# Patient Record
Sex: Female | Born: 1945
Health system: Southern US, Community
[De-identification: ages and names within clinical notes are randomized; demographics above are authoritative.]

## PROBLEM LIST (undated history)

## (undated) DIAGNOSIS — E785 Hyperlipidemia, unspecified: Secondary | ICD-10-CM

## (undated) DIAGNOSIS — E119 Type 2 diabetes mellitus without complications: Secondary | ICD-10-CM

## (undated) DIAGNOSIS — M1990 Unspecified osteoarthritis, unspecified site: Secondary | ICD-10-CM

## (undated) DIAGNOSIS — I10 Essential (primary) hypertension: Secondary | ICD-10-CM

## (undated) DIAGNOSIS — M109 Gout, unspecified: Secondary | ICD-10-CM

## (undated) HISTORY — PX: JOINT REPLACEMENT: SHX530

## (undated) HISTORY — DX: Essential (primary) hypertension: I10

## (undated) HISTORY — DX: Hyperlipidemia, unspecified: E78.5

## (undated) HISTORY — DX: Type 2 diabetes mellitus without complications: E11.9

## (undated) HISTORY — DX: Gout, unspecified: M10.9

## (undated) HISTORY — PX: ABDOMINAL HYSTERECTOMY: SHX81

---

## 1997-07-08 ENCOUNTER — Ambulatory Visit (HOSPITAL_COMMUNITY): Admission: RE | Admit: 1997-07-08 | Discharge: 1997-07-08 | Payer: Self-pay | Admitting: Sports Medicine

## 1997-11-04 ENCOUNTER — Ambulatory Visit (HOSPITAL_COMMUNITY): Admission: RE | Admit: 1997-11-04 | Discharge: 1997-11-04 | Payer: Self-pay | Admitting: Orthopedic Surgery

## 1998-02-28 ENCOUNTER — Other Ambulatory Visit: Admission: RE | Admit: 1998-02-28 | Discharge: 1998-02-28 | Payer: Self-pay | Admitting: Obstetrics and Gynecology

## 1998-06-16 ENCOUNTER — Ambulatory Visit (HOSPITAL_COMMUNITY): Admission: RE | Admit: 1998-06-16 | Discharge: 1998-06-16 | Payer: Self-pay

## 1998-06-16 ENCOUNTER — Encounter: Payer: Self-pay | Admitting: Obstetrics and Gynecology

## 1999-06-22 ENCOUNTER — Ambulatory Visit (HOSPITAL_COMMUNITY): Admission: RE | Admit: 1999-06-22 | Discharge: 1999-06-22 | Payer: Self-pay | Admitting: Family Medicine

## 1999-06-22 ENCOUNTER — Encounter: Payer: Self-pay | Admitting: Obstetrics and Gynecology

## 2000-07-04 ENCOUNTER — Ambulatory Visit (HOSPITAL_COMMUNITY): Admission: RE | Admit: 2000-07-04 | Discharge: 2000-07-04 | Payer: Self-pay | Admitting: Obstetrics and Gynecology

## 2000-07-04 ENCOUNTER — Encounter: Payer: Self-pay | Admitting: Obstetrics and Gynecology

## 2000-09-02 ENCOUNTER — Other Ambulatory Visit: Admission: RE | Admit: 2000-09-02 | Discharge: 2000-09-02 | Payer: Self-pay | Admitting: Obstetrics and Gynecology

## 2000-12-05 ENCOUNTER — Ambulatory Visit (HOSPITAL_BASED_OUTPATIENT_CLINIC_OR_DEPARTMENT_OTHER): Admission: RE | Admit: 2000-12-05 | Discharge: 2000-12-05 | Payer: Self-pay | Admitting: Ophthalmology

## 2001-05-26 ENCOUNTER — Encounter: Payer: Self-pay | Admitting: Orthopedic Surgery

## 2001-05-28 ENCOUNTER — Inpatient Hospital Stay (HOSPITAL_COMMUNITY): Admission: RE | Admit: 2001-05-28 | Discharge: 2001-06-04 | Payer: Self-pay | Admitting: Orthopedic Surgery

## 2001-05-28 ENCOUNTER — Encounter: Payer: Self-pay | Admitting: Orthopedic Surgery

## 2001-07-27 ENCOUNTER — Encounter: Payer: Self-pay | Admitting: Obstetrics and Gynecology

## 2001-07-27 ENCOUNTER — Ambulatory Visit (HOSPITAL_COMMUNITY): Admission: RE | Admit: 2001-07-27 | Discharge: 2001-07-27 | Payer: Self-pay | Admitting: Obstetrics and Gynecology

## 2002-03-10 ENCOUNTER — Encounter: Payer: Self-pay | Admitting: Orthopedic Surgery

## 2002-03-10 ENCOUNTER — Inpatient Hospital Stay (HOSPITAL_COMMUNITY): Admission: RE | Admit: 2002-03-10 | Discharge: 2002-03-14 | Payer: Self-pay | Admitting: Orthopedic Surgery

## 2002-07-29 ENCOUNTER — Encounter: Payer: Self-pay | Admitting: Obstetrics and Gynecology

## 2002-07-29 ENCOUNTER — Ambulatory Visit (HOSPITAL_COMMUNITY): Admission: RE | Admit: 2002-07-29 | Discharge: 2002-07-29 | Payer: Self-pay | Admitting: Obstetrics and Gynecology

## 2003-08-11 ENCOUNTER — Ambulatory Visit (HOSPITAL_COMMUNITY): Admission: RE | Admit: 2003-08-11 | Discharge: 2003-08-11 | Payer: Self-pay | Admitting: Obstetrics and Gynecology

## 2004-08-13 ENCOUNTER — Ambulatory Visit (HOSPITAL_COMMUNITY): Admission: RE | Admit: 2004-08-13 | Discharge: 2004-08-13 | Payer: Self-pay | Admitting: Obstetrics and Gynecology

## 2004-12-04 ENCOUNTER — Ambulatory Visit (HOSPITAL_COMMUNITY): Admission: RE | Admit: 2004-12-04 | Discharge: 2004-12-04 | Payer: Self-pay | Admitting: Gastroenterology

## 2005-09-02 ENCOUNTER — Ambulatory Visit (HOSPITAL_COMMUNITY): Admission: RE | Admit: 2005-09-02 | Discharge: 2005-09-02 | Payer: Self-pay | Admitting: Obstetrics and Gynecology

## 2005-09-19 ENCOUNTER — Encounter: Payer: Self-pay | Admitting: Vascular Surgery

## 2005-09-19 ENCOUNTER — Ambulatory Visit (HOSPITAL_COMMUNITY): Admission: RE | Admit: 2005-09-19 | Discharge: 2005-09-19 | Payer: Self-pay | Admitting: Internal Medicine

## 2006-09-04 ENCOUNTER — Ambulatory Visit (HOSPITAL_COMMUNITY): Admission: RE | Admit: 2006-09-04 | Discharge: 2006-09-04 | Payer: Self-pay | Admitting: Obstetrics and Gynecology

## 2007-09-07 ENCOUNTER — Ambulatory Visit (HOSPITAL_COMMUNITY): Admission: RE | Admit: 2007-09-07 | Discharge: 2007-09-07 | Payer: Self-pay | Admitting: Obstetrics and Gynecology

## 2008-09-13 ENCOUNTER — Ambulatory Visit (HOSPITAL_COMMUNITY): Admission: RE | Admit: 2008-09-13 | Discharge: 2008-09-13 | Payer: Self-pay | Admitting: Obstetrics and Gynecology

## 2009-05-19 ENCOUNTER — Encounter: Admission: RE | Admit: 2009-05-19 | Discharge: 2009-05-19 | Payer: Self-pay | Admitting: Neurosurgery

## 2009-09-19 ENCOUNTER — Ambulatory Visit (HOSPITAL_COMMUNITY): Admission: RE | Admit: 2009-09-19 | Discharge: 2009-09-19 | Payer: Self-pay | Admitting: Obstetrics and Gynecology

## 2010-03-25 ENCOUNTER — Encounter: Payer: Self-pay | Admitting: Obstetrics and Gynecology

## 2010-07-20 NOTE — Discharge Summary (Signed)
[place]. [employer]  Patient:    Cynthia Torres, Cynthia Torres Visit Number: [id number] MRN: [id number]          Service Type: SUR Location: 5000 5009 01 Attending Physician:  Colbert Ewing Dictated by:   Silver Huguenin, P.A.C. Admit Date:  05/28/2001 Discharge Date: 06/04/2001                             Discharge Summary  ADMISSION DIAGNOSIS: Advanced degenerative joint disease of the right hip.  DISCHARGE DIAGNOSES: 1. Advanced degenerative joint disease of the right hip. 2. History of hypertension. 3. Exogenous obesity. 4. Hypokalemia.  PROCEDURE: Left total hip replacement.  HISTORY OF PRESENT ILLNESS: A 65 year old female with long standing bilateral hip degenerative joint disease. Her right has now become very symptomatic with pain as well as with activities of daily living. Also noted night time pain. She has failed conservative treatment. She is indicated for a right total hip replacement.  HOSPITAL COURSE: A 65 year old female admitted on May 28, 2001 after appropriate laboratory studies were obtained. She was taken to the operating room where she underwent a right total hip replacement. Placed pre and postoperatively on Ancef 1 Gm IV q.8h. times there doses. Heparin 5,000 units SQ q.12h. was started until Coumadin became therapeutic.  Consultations with PT and OT were obtained. Ambulation with touch down weight bearing on the right. A Foley was placed intraoperatively. She was allowed out of bed to a chair the following day. Her IV was [facility] and her PCA was discontinued on May 29, 2001. She was then placed on Percocet as previously ordered.  Her K-Dur was changed to 20 meq p.o. b.i.d. because of her hypokalemia. She had difficulty with constipation and this was corrected with the laxative of choice, that of Magnesium Citrate one bottle. Rehab consult was ordered.  Her potassium did improve. Unfortunately, she insurance would not  consider inpatient rehab and therefore, also would only allow for stay in the hospital until June 04, 2001. She was discharged on that day in an improved condition.  DIAGNOSTIC STUDIES: EKG showed normal sinus rhythm with right bundle branch, left anterior vesicular block and a bifascicular block. Left ventricular hypertrophy with QRS widening. Cannot rule out septal infarction, age indeterminate.  LABORATORY STUDIES: On May 26, 2001 revealed hemoglobin of 11.6, hematocrit 34.6, white count 9,100, platelets 223,000.  Discharge hemoglobin 8.1, hematocrit 24.3, white count 11,300, platelets 238,000. Chemistry preop revealed sodium of 141, potassium 3.6, chloride 102, CO2 30, glucose 101, BUN 27, creatinine 1.2, calcium 11.0, total protein 7.0, albumin 3.6, AST 19, ALT 21, ALP 82, total bilirubin 0.8. Discharge sodium 138, potassium 3.5, chloride 102, CO2 29, glucose 102, BUN 24, creatinine 1.1, calcium 9.3.  Urinalysis on May 26, 2001 was benign for a voided urine. Blood type was B+, wide screen negative.  DISCHARGE MEDICATIONS: 1. Iron sulfate 325 mg 1 daily. 2. Percocet 1-2 q.4h p.r.n. pain. 3. Coumadin 5 mg as directed by [facility].  ACTIVIIY: She is allowed to ambulate, touch down weight bearing only on the right leg. Will change dressing daily, keeping it clean and dry. She may shower. Call if she has increased swelling, pain, redness, or temperature greater than 101.  FOLLOW-UP: With [place] in one week for recheck evaluation and staple removal. Dictated by:   Silver Huguenin, P.A.C. Attending Physician:  Colbert Ewing DD:  06/12/01 TD:  06/14/01 Job: [id number]

## 2010-07-20 NOTE — Op Note (Signed)
[place]  Patient:    Cynthia Torres, Cynthia Torres Visit Number: [id number] MRN: [id number]          Service Type: Attending:  Pasty Spillers. Maple Hudson, M.D. Dictated by:   Pasty Spillers. Maple Hudson, M.D. Proc. Date: 12/05/00                             Operative Report  PREOPERATIVE DIAGNOSIS:  Exotropia, secondary to long-standing right third nerve palsy.  POSTOPERATIVE DIAGNOSIS:  Exotropia, secondary to long-standing right third nerve palsy.  PROCEDURES: 1. Right lateral rectus muscle recession, 12.0 mm, adjustable technique. 2. Right medial rectus muscle resection, 7.5 mg.  SURGEON:  Pasty Spillers. Maple Hudson, M.D.  ANESTHESIA:  General endotracheal.  COMPLICATIONS:  None.  DESCRIPTION OF PROCEDURE:  After routine preoperative evaluation, including informed consent, the patient was taken to the operating room, where she was identified by me.  General anesthesia was induced without difficulty after placement of appropriate monitors.  The patient prepped and draped in the standard sterile fashion.  A lid speculum was placed at the right eye.  Forced ductions revealed no significant limitations on forced adduction of the right eye.  A traction suture of 6-0 silk was placed at the superior and inferior limbus.  A limbal conjunctival peritomy of two clock hours extent was made temporally in the right eye with Westcott scissors, with relaxing incisions in the superotemporal and inferotemporal quadrant.  The right lateral rectus muscle was engaged on a series of muscle hooks and carefully cleared of its surrounding fascial attachments.  The tendon was secured with a double-armed 6-0 Vicryl suture, with a double locking bite at each border of the muscle.  The muscle was disinserted from the globe using Westcott scissors.  Each pole suture was passed in crossed-swords fashion through the muscle stump, and the muscle was drawn up to the level of the original insertion.  The two  pole sutures were tied together at least 10 cm above sclera.  A needle driver was then used to join the pole sutures at a measured distance of 12.0 mm above sclera.  A noose suture was placed around the pole sutures at this location and then tied securely.  The muscle was allowed to retract to a 12 mm recessed position.  Conjunctiva was reapposed using multiple interrupted 6-0 plain gut sutures, leaving the conjunctiva recessed to the level of the muscle stump.  A limbal conjunctival peritomy was made nasally with Westcott scissors, as described for the temporal peritomy.  The right medial rectus muscle was engaged on a series of muscle hooks and carefully cleared of its surrounding fascial attachments.  Care was taken not to disrupt the muscle pulley by sharp dissection, but a Q-Tip was used to push fascia posteriorly to at least 15 mm posterior to the insertion.  The muscle was spread between self-retaining hooks.  A 2 mm bite of the center of the muscle belly was taken with a 6-0 Vicryl suture, and a knot was tied securely at this location.  Each pole suture was passed from the center of the muscle belly to the periphery at a measured distance of 7.5 mm posterior to the insertion, and a double locking bite was placed at each border of the muscle.  A resection clamp was placed on the muscle just anterior to these sutures.  The muscle was disinserted.  Each pole suture was passed posteriorly to anteriorly near the periphery of the  stump, then anteriorly to posteriorly near the center of the stump, then posteriorly to anteriorly through the center of the muscle belly, just posterior to the previously-placed knots.  The muscle was drawn up to the level of the original insertion, and all slack was removed before the sutures were tied securely.  The resection clamp was removed, and the portion of muscle anterior to the sutures was carefully excised.  Conjunctiva was reapposed after resecting  approximately 3 mm of conjunctiva, to a position in which it was recessed approximately 1 mm posterior to the limbus.  The silk traction suture was removed and was replaced at the temporal limbus, tying its ends together at least 10 cm above sclera.  The pole, noose, and traction sutures were secured to the cheek with a Steri-Strip.  Tobradex ointment was placed in the eye.  A patch was placed over the eye.  The patient was awakened without difficulty and taken to the recovery room in stable condition, having suffered no intraoperative or immediate postoperative complications. Dictated by:   Pasty Spillers. Maple Hudson, M.D. Attending:  Pasty Spillers. Maple Hudson, M.D. DD:  12/05/00 TD:  12/06/00 Job: [id number]

## 2010-07-20 NOTE — Op Note (Signed)
NAMESHANEIL, YAZDI               ACCOUNT NO.:  [id number]   MEDICAL RECORD NO.:  [id number]          PATIENT TYPE:  AMB   LOCATION:  ENDO                         FACILITY:  [facility]   PHYSICIAN:  Danise Edge, M.D.   DATE OF BIRTH:  02/28/1946   DATE OF PROCEDURE:  12/04/2004  DATE OF DISCHARGE:                                 OPERATIVE REPORT   PROCEDURE:  Screening colonoscopy.   REFERRING PHYSICIANS:  Dr. Kirby Funk, Dr. Hilbert Bible.   PROCEDURE INDICATIONS:  Ms. Chayna Surratt is a 65 year old female born  1945-08-30. Ms. Alicia is scheduled to undergo her first screening  colonoscopy with polypectomy to prevent colon cancer.   ENDOSCOPIST:  Danise Edge, M.D.   PREMEDICATION:  Versed 6 mg, Demerol 70 mg.   DESCRIPTION OF PROCEDURE:  After obtaining informed consent, Ms. Abdella was  placed in the left lateral decubitus position. I administered intravenous  Demerol and intravenous Versed to achieve conscious sedation for the  procedure. The patient's blood pressure, oxygen saturation and cardiac  rhythm were monitored throughout the procedure and documented in the medical  record.   Anal inspection and digital rectal exam were normal. The Olympus adjustable  pediatric colonoscope was introduced into the rectum and advanced to the  cecum. A normal-appearing ileocecal valve and appendiceal orifice were  identified. Colonic preparation for the exam today was excellent.   RECTUM:  Normal. I was unable to perform a retroflexed view of the distal  rectum.  SIGMOID COLON AND DESCENDING COLON:  Normal.  SPLENIC FLEXURE:  Normal.  TRANSVERSE COLON:  Normal.  HEPATIC FLEXURE:  Normal.  ASCENDING COLON:  Normal.  CECUM AND ILEOCECAL VALVE:  Normal.   ASSESSMENT:  Normal screening proctocolonoscopy to the cecum.           ______________________________  Danise Edge, M.D.     MJ/MEDQ  D:  12/04/2004  T:  12/04/2004  Job:  [id number]   cc:   Thora Lance, M.D.  Fax: [phone]   Malachi Pro. Ambrose Mantle, M.D.  Fax: [phone]

## 2010-07-20 NOTE — Op Note (Signed)
Cynthia Torres, Cynthia Torres                           ACCOUNT NO.:  [id number]   MEDICAL RECORD NO.:  [id number]                   PATIENT TYPE:   LOCATION:                                       FACILITY:   PHYSICIAN:  Loreta Ave, M.D.              DATE OF BIRTH:  25-Feb-1946   DATE OF PROCEDURE:  03/10/2002  DATE OF DISCHARGE:                                 OPERATIVE REPORT   PREOPERATIVE DIAGNOSIS:  End-stage degenerative arthritis, left hip.   POSTOPERATIVE DIAGNOSIS:  End-stage degenerative arthritis, left hip.   OPERATIVE PROCEDURE:  Left total hip replacement utilizing Osteonics  prosthesis, 50 mm posterior stabilizing PSL acetabular component.  Screw  fixation x2.  28-mm internal diameter, Crossfire polyethylene liner with 10-  degree overhand.  A Press-Fit femoral component, size #8, with a 14 mm  distal diameter, Secure-Fit Pus type.  28-mm +5-C tapered head.   SURGEON:  Loreta Ave, M.D.   ASSISTANT:  Jacqualine Code, P.A.   ANESTHESIA:  General.   BLOOD LOSS:  400 cubic centimeters.   BLOOD GIVEN:  None.   SPECIMENS EXCISED:  Bone and soft tissue.   CULTURES:  None.   COMPLICATIONS:  None.   DRESSING:  Soft compressive with abduction pillow.   PROCEDURE:  The patient was brought to the operating room and placed on the  operating table in the supine position.  After adequate anesthesia had been  obtained, turned to the lateral position, left side up.  Appropriate padding  and support.  Prepped and draped in the usual sterile fashion.  Exposure  very difficult because of degree of adiposity.  Longitudinal incision from  the shaft of the femur up to the trochanter extending posterior superior.  Skin, subcutaneous tissue, and adipose tissue divided down to the iliotibial  band.  This was exposed and then incised.  Charnley retractor put in place.  Neurovascular structures were identified and protected.  External rotator  capsule taken down off the back of  the intertrochanteric groove on the  femur.  Hip exposed.  Dislocated posteriorly.  Grade-4 changes.  Femoral  head removed 1 fingerbreadth above the lesser trochanter in line with the  definitive component.  Acetabulum exposed.  Grade-4 changes.  Periarticular  spurs, remnants of labrum, loose bodies, synovitis all debrided.  Sequential  reaming up to good bleeding bone with appropriate medial and inferior  placement.  A 50-mm cup was sized.  Definitive cup was hammered in place and  set at 45 degrees of abduction, 20 degrees of anteversion.  Although good  capturing, this was augmented with 2 screws placed through the cup in the  acetabulum.  A 28-mm internal diameter 10-degree liner was then placed with  the overhang placed posterior and superior.  Retractors removed.  Wound  irrigated.  Femur exposed.  Sequential reaming proximally and distally to  good filling of the canal and sized for a #  8 femoral component with a 14 mm  distal stem.  Sequential trials.  Normal anteversion of the femur restored  with the component.  After sizing for the #8 component, a +5 head was chosen  which gave a good restoration of leg lengths and excellent stability.  All  trials were removed.  Definitive component assembled and hammered down the  femur.  The +5 head attached.  The hip reduced.  Excellent motion and  excellent stability in flexion and extension.  Wound irrigated.  External  rotator capsule repaired at the back of the intertrochanteric groove, 2  drill holes and sutures tied over bone.  Charnley retractor removed.  Iliotibial band closed with #1 Vicryl.  Skin and subcutaneous tissue with  Vicryl and then staples.  The margin of the wound injected with Marcaine.  Sterile compressive dressing applied.  Returned to supine position.  Anesthesia reversed.  Abduction pillow placed.  Brought to the recovery  room.  Tolerated surgery well.  No complications.                                                Loreta Ave, M.D.    DFM/MEDQ  D:  03/11/2002  T:  03/11/2002  Job:  [id number]

## 2010-07-20 NOTE — Discharge Summary (Signed)
NAME:  Cynthia Torres, Cynthia Torres                         ACCOUNT NO.:  [id number]   MEDICAL RECORD NO.:  [id number]                   PATIENT TYPE:  INP   LOCATION:  5014                                 FACILITY:  [facility]   PHYSICIAN:  Loreta Ave, M.D.              DATE OF BIRTH:  09-01-1945   DATE OF ADMISSION:  03/10/2002  DATE OF DISCHARGE:  03/14/2002                                 DISCHARGE SUMMARY   ADMISSION DIAGNOSIS:  Advanced degenerative joint disease of the left hip.   DISCHARGE DIAGNOSES:  1. Advanced degenerative joint disease of the left hip.  2. Hypertension.  3. Exogenous obesity.   PROCEDURE:  Left total hip replacement.   BRIEF HISTORY:  This is a 65 year old female with longstanding DJD of the  left hip. She is status post right total hip replacement.  She has now been  quite symptomatic on the left, and it has failed conservative treatment.  Indicated now for left total hip replacement.   HOSPITAL COURSE:  The patient is a 65 year old female admitted 03/10/2002  after appropriate laboratory studies were obtained as well as 1 g Ancef on  call to operating room.  She was taken to the operating room where she  underwent a left total hip replacement.  She tolerated the procedure well.  She was continued on Ancef 1 g IV q.8h. x 3 doses.  Heparin 5000 units  subcutaneously q.12h. was started until her Coumadin became therapeutic.  Abduction pillow was placed postoperatively.  A Foley was placed  intraoperative.  Consultations with PT and OT were made.  She will follow  the total hip protocol.  Ambulation with touch-down weightbearing on the  left. Dilaudid PCA pump was used for pain control.   She was allowed out of bed to chair the follow day.  Urinalysis was  performed on January 8.  The patient was given K-Dur 20 mEq t.i.d. for  hypokalemia.  Her IV was heparin locked on January 8.  She continued with  Kay-Ciel 20 mEq four times a day.  Her dressing was changed on  postop day #2  revealing no signs of infection.  The remainder of her hospital course was  uneventful.  She was discharged to home on 03/14/2002 to follow up back in  the office in 10 days.   LABORATORY DATA:  Radiographic studies of the left hip on 03/10/2002 reveals  left hip orthesis in appropriate position with no evidence of fracture  dislocation.   Chest x-ray of 05/26/2001 reveals cardiomegaly with no active disease.   The patient was admitted with hemoglobin 12.0,  hematocrit 36.5%, white  count 7100, platelets 215,000.  Discharge hemoglobin 8.4, hematocrit 25.4%.  Preop chemistries: Sodium 142, potassium 3.9, chloride 103, CO2 30, glucose  112, BUN 17, creatinine 1.0, calcium 10.5, total protein 7.2, albumin 3.4,  AST 19, ALT 25, alkaline phosphatase 99, total bilirubin 0.6.  At discharge,  sodium 133, potassium 3.3, chloride 101, CO2 26, glucose 144, BUN 21,  creatinine 1.3, and calcium 9.0.  Somantadine C level was 126, and this was  apparently within the reference range.  Urinalysis of 03/08/2002 was benign;  03/11/2002 urinalysis revealed 21 to 50 red cells, 3 to 6 white cells, rare  bacteria.   DISCHARGE MEDICATIONS:  1. Prescription for Percocet 5/325 one to two q.4h. p.r.n. pain.  2. Coumadin 5 mg, take 1-1/2 tablets by mouth every day until otherwise     directed.  3. Continue with home medications.  4. K-Dur 10 mEq p.o. daily.   DISCHARGE INSTRUCTIONS:  1. She will follow total hip precautions.  2. No restrictions on diet.  3. Daily dressing changes as instructed.  4. Total hip precautions.  5. Follow up back in the office in 10 to 14 days with Dr. Eulah Pont.   CONDITION ON DISCHARGE:  Improved.      Oris Drone Petrarca, P.A.-C.                Loreta Ave, M.D.    BDP/MEDQ  D:  04/16/2002  T:  04/16/2002  Job:  [id number]

## 2010-07-20 NOTE — Op Note (Signed)
[place]. [facility]  Patient:    Cynthia Torres, Cynthia Torres Visit Number: [id number] MRN: [id number]          Service Type: SUR Location: 5000 5009 01 Attending Physician:  Colbert Ewing Dictated by:   Loreta Ave, M.D. Proc. Date: 05/28/01 Admit Date:  05/28/2001                             Operative Report  PREOPERATIVE DIAGNOSIS:  End-stage degenerative arthritis of right hip.  POSTOPERATIVE DIAGNOSIS:  End-stage degenerative arthritis of right hip.  OPERATIVE PROCEDURE:  Right total hip replacement utilizing Osteonics prosthesis, Press-Fit 50 mm acetabular components, screw fixation x2, and 10 degree polyethylene insert, CrossFire-type 28 mm internal diameter, Press-Fit femoral component.  Secur-Fit Plus stem, size #8 with a 14 mm distal stem, a 28 mm zero C-taper head.  SURGEON:  Loreta Ave, M.D.  ASSISTANT:  Arlys John D. Petrarca, P.A.-C.  ANESTHESIA:  General.  ESTIMATED BLOOD LOSS:  500 cc.  BLOOD GIVEN:  None.  SPECIMENS:  Excised bone and soft tissue.  CULTURES:  None.  COMPLICATIONS:  None.  DRESSING:  Self-compressive with abduction pillow.  DESCRIPTION OF PROCEDURE:  The patient was brought to the operating room and placed on the operating table in the supine position.  After adequate anesthesia had been obtained, turned to the lateral position with the right side up.  Prepped and draped in the usual sterile fashion.  An incision along the femur extending posterior and superior.  Skin and subcutaneous tissue divided.  The iliotibial band exposed.  The iliotibial band incised and Charnley retractor put in place.  Neurovascular structures were identified and protected.  External rotator and capsule taken down off the back of the intertrochanteric groove of the femur and tagged with #1 Ethibond.  The hip dislocated posteriorly.  Grade 4 changes throughout.  Femoral neck cut in line with the definitive prosthesis one  fingerbreadth above the lesser trochanter and the femoral head removed.  Acetabulum exposed.  Debris cleared. Sequential reaming up to good bleeding bone throughout with appropriate medial and inferior placement.  Sized for a 50 mm component.  Definitive component hammered in place at 45 degrees of abduction and 20 of anteversion.  Excellent capturing and fixation which was augmented with a 20 mm screw above and a 16 mm screw posteriorly and placed through the cuff after predrilling. Polyethylene attached with a 10 degree insert placed posterosuperior.  The wound irrigated.  Retractors removed.  Femur exposed.  Sequential reaming with the power and hand-held reamers up to a #8 Secur-Fit Plus component with a 14 mm distal stem.  Trial was put in place.  With the 0 head, I had excellent restoration of leg lengths, and good stability throughout.  Trial was removed. We had restored normal anteversion with placement of this component. Definitive component assembled and hammered down to femur.  The 0 head attached to this.  The hip reduced.  Excellent stability and alignment.  The wound irrigated.  External rotator and capsule were repaired to the back of the intertrochanteric groove through drill holes and tied over bone.  The Charnley retractor removed.  The iliotibial band closed with #1 Vicryl.  The skin with subcutaneous tissue with Vicryl and then staples.  The margins of the wound injected with Marcaine.  A sterile compressive dressing applied.  Returned to the supine position.  Abduction pillow placed.  The anesthesia reversed.  Brought  to the recovery room.  Tolerated surgery well with no complications. Dictated by:   Loreta Ave, M.D. Attending Physician:  Colbert Ewing DD:  05/28/01 TD:  05/29/01 Job: [id number]

## 2010-08-22 ENCOUNTER — Other Ambulatory Visit (HOSPITAL_COMMUNITY): Payer: Self-pay | Admitting: Obstetrics and Gynecology

## 2010-08-22 DIAGNOSIS — Z1231 Encounter for screening mammogram for malignant neoplasm of breast: Secondary | ICD-10-CM

## 2010-09-21 ENCOUNTER — Ambulatory Visit (HOSPITAL_COMMUNITY)
Admission: RE | Admit: 2010-09-21 | Discharge: 2010-09-21 | Disposition: A | Discharge status: Home / Self Care | Payer: Medicare Other | Source: Ambulatory Visit | Attending: Obstetrics and Gynecology | Admitting: Obstetrics and Gynecology

## 2010-09-21 DIAGNOSIS — Z1231 Encounter for screening mammogram for malignant neoplasm of breast: Secondary | ICD-10-CM | POA: Insufficient documentation

## 2011-08-13 ENCOUNTER — Other Ambulatory Visit (HOSPITAL_COMMUNITY): Payer: Self-pay | Admitting: Internal Medicine

## 2011-08-13 DIAGNOSIS — Z1231 Encounter for screening mammogram for malignant neoplasm of breast: Secondary | ICD-10-CM

## 2011-09-27 ENCOUNTER — Ambulatory Visit (HOSPITAL_COMMUNITY): Payer: Medicare Other

## 2011-10-04 ENCOUNTER — Ambulatory Visit (HOSPITAL_COMMUNITY)
Admission: RE | Admit: 2011-10-04 | Discharge: 2011-10-04 | Disposition: A | Discharge status: Home / Self Care | Payer: Medicare Other | Source: Ambulatory Visit | Attending: Internal Medicine | Admitting: Internal Medicine

## 2011-10-04 DIAGNOSIS — Z1231 Encounter for screening mammogram for malignant neoplasm of breast: Secondary | ICD-10-CM | POA: Insufficient documentation

## 2011-12-13 ENCOUNTER — Other Ambulatory Visit: Payer: Self-pay | Admitting: Orthopedic Surgery

## 2011-12-13 DIAGNOSIS — M48061 Spinal stenosis, lumbar region without neurogenic claudication: Secondary | ICD-10-CM

## 2011-12-20 ENCOUNTER — Other Ambulatory Visit: Payer: Medicare Other

## 2011-12-20 ENCOUNTER — Ambulatory Visit
Admission: RE | Admit: 2011-12-20 | Discharge: 2011-12-20 | Disposition: A | Discharge status: Home / Self Care | Payer: Medicare Other | Source: Ambulatory Visit | Attending: Orthopedic Surgery | Admitting: Orthopedic Surgery

## 2011-12-20 VITALS — BP 164/88 | HR 63

## 2011-12-20 DIAGNOSIS — M48061 Spinal stenosis, lumbar region without neurogenic claudication: Secondary | ICD-10-CM

## 2011-12-20 MED ORDER — METHYLPREDNISOLONE ACETATE 40 MG/ML INJ SUSP (RADIOLOG
120.0000 mg | Freq: Once | INTRAMUSCULAR | Status: AC
  Administered 2011-12-20: 120 mg via EPIDURAL

## 2011-12-20 MED ORDER — IOHEXOL 180 MG/ML  SOLN
1.0000 mL | Freq: Once | INTRAMUSCULAR | Status: AC | PRN
  Administered 2011-12-20: 1 mL via EPIDURAL

## 2012-09-15 ENCOUNTER — Other Ambulatory Visit (HOSPITAL_COMMUNITY): Payer: Self-pay | Admitting: Internal Medicine

## 2012-09-15 DIAGNOSIS — Z1231 Encounter for screening mammogram for malignant neoplasm of breast: Secondary | ICD-10-CM

## 2012-10-09 ENCOUNTER — Ambulatory Visit (HOSPITAL_COMMUNITY)
Admission: RE | Admit: 2012-10-09 | Discharge: 2012-10-09 | Disposition: A | Discharge status: Home / Self Care | Payer: Medicare Other | Source: Ambulatory Visit | Attending: Internal Medicine | Admitting: Internal Medicine

## 2012-10-09 DIAGNOSIS — Z1231 Encounter for screening mammogram for malignant neoplasm of breast: Secondary | ICD-10-CM | POA: Insufficient documentation

## 2013-09-10 ENCOUNTER — Other Ambulatory Visit (HOSPITAL_COMMUNITY): Payer: Self-pay | Admitting: Internal Medicine

## 2013-09-10 DIAGNOSIS — Z1231 Encounter for screening mammogram for malignant neoplasm of breast: Secondary | ICD-10-CM

## 2013-10-15 ENCOUNTER — Ambulatory Visit (HOSPITAL_COMMUNITY)
Admission: RE | Admit: 2013-10-15 | Discharge: 2013-10-15 | Disposition: A | Discharge status: Home / Self Care | Payer: Medicare PPO | Source: Ambulatory Visit | Attending: Internal Medicine | Admitting: Internal Medicine

## 2013-10-15 DIAGNOSIS — Z1231 Encounter for screening mammogram for malignant neoplasm of breast: Secondary | ICD-10-CM | POA: Diagnosis present

## 2014-04-29 ENCOUNTER — Ambulatory Visit: Payer: Self-pay | Admitting: Podiatrist

## 2014-05-06 ENCOUNTER — Ambulatory Visit (INDEPENDENT_AMBULATORY_CARE_PROVIDER_SITE_OTHER): Payer: Medicare Other | Admitting: Podiatrist

## 2014-05-06 ENCOUNTER — Encounter: Payer: Self-pay | Admitting: Podiatrist

## 2014-05-06 VITALS — BP 149/82 | HR 71 | Resp 16

## 2014-05-06 DIAGNOSIS — B351 Tinea unguium: Secondary | ICD-10-CM | POA: Diagnosis not present

## 2014-05-06 DIAGNOSIS — M79676 Pain in unspecified toe(s): Secondary | ICD-10-CM

## 2014-05-06 NOTE — Patient Instructions (Signed)
Diabetes and Foot Care Diabetes may cause you to have problems because of poor blood supply (circulation) to your feet and legs. This may cause the skin on your feet to become thinner, break easier, and heal more slowly. Your skin may become dry, and the skin may peel and crack. You may also have nerve damage in your legs and feet causing decreased feeling in them. You may not notice minor injuries to your feet that could lead to infections or more serious problems. Taking care of your feet is one of the most important things you can do for yourself.  HOME CARE INSTRUCTIONS  Wear shoes at all times, even in the house. Do not go barefoot. Bare feet are easily injured.  Check your feet daily for blisters, cuts, and redness. If you cannot see the bottom of your feet, use a mirror or ask someone for help.  Wash your feet with warm water (do not use hot water) and mild soap. Then pat your feet and the areas between your toes until they are completely dry. Do not soak your feet as this can dry your skin.  Apply a moisturizing lotion or petroleum jelly (that does not contain alcohol and is unscented) to the skin on your feet and to dry, brittle toenails. Do not apply lotion between your toes.  Trim your toenails straight across. Do not dig under them or around the cuticle. File the edges of your nails with an emery board or nail file.  Do not cut corns or calluses or try to remove them with medicine.  Wear clean socks or stockings every day. Make sure they are not too tight. Do not wear knee-high stockings since they may decrease blood flow to your legs.  Wear shoes that fit properly and have enough cushioning. To break in new shoes, wear them for just a few hours a day. This prevents you from injuring your feet. Always look in your shoes before you put them on to be sure there are no objects inside.  Do not cross your legs. This may decrease the blood flow to your feet.  If you find a minor scrape,  cut, or break in the skin on your feet, keep it and the skin around it clean and dry. These areas may be cleansed with mild soap and water. Do not cleanse the area with peroxide, alcohol, or iodine.  When you remove an adhesive bandage, be sure not to damage the skin around it.  If you have a wound, look at it several times a day to make sure it is healing.  Do not use heating pads or hot water bottles. They may burn your skin. If you have lost feeling in your feet or legs, you may not know it is happening until it is too late.  Make sure your health care provider performs a complete foot exam at least annually or more often if you have foot problems. Report any cuts, sores, or bruises to your health care provider immediately. SEEK MEDICAL CARE IF:   You have an injury that is not healing.  You have cuts or breaks in the skin.  You have an ingrown nail.  You notice redness on your legs or feet.  You feel burning or tingling in your legs or feet.  You have pain or cramps in your legs and feet.  Your legs or feet are numb.  Your feet always feel cold. SEEK IMMEDIATE MEDICAL CARE IF:   There is increasing redness,   swelling, or pain in or around a wound.  There is a red line that goes up your leg.  Pus is coming from a wound.  You develop a fever or as directed by your health care provider.  You notice a bad smell coming from an ulcer or wound. Document Released: 02/16/2000 Document Revised: 10/21/2012 Document Reviewed: 07/28/2012 Exit[employer] Patient Information 2015 [employer]. This information is not intended to replace advice given to you by your health care provider. Make sure you discuss any questions you have with your health care provider.  

## 2014-05-06 NOTE — Progress Notes (Signed)
   Subjective:    Patient ID: Cynthia Torres, female    DOB: August 17, 1945, 68 y.o.   MRN: [id number]  HPI Comments: "I need the nails cut"  Patient states that she needs to have her nails cut. She was just recently diagnosed diabetic x 3 months. She says glucose has ben good. PCP didn't want her to trim her own nails.     Review of Systems  All other systems reviewed and are negative.      Objective:   Physical Exam  Patient is awake, alert, and oriented x 3.  In no acute distress.  Vascular status is intact with palpable pedal pulses at 2/4 DP and PT bilateral and capillary refill time within normal limits. Neurological sensation is also intact bilaterally via Semmes Weinstein monofilament at 5/5 sites. Light touch, vibratory sensation, Achilles tendon reflex is intact. Dermatological exam reveals skin color, turger and texture as normal. No open lesions present.  Musculature intact with dorsiflexion, plantarflexion, inversion, eversion.  Nails are long, thick, discolored, distrophic and clinically mycotic x 10.  They are painful in shoes and ambulation.     Assessment & Plan:  Diabetic foot evaluation, mycotic toenails  Discussed etiology, pathology, conservative vs. Surgical therapies and at this time debridement of symptomatic toenails was recommended.  Onychoreduction of symptomatic toenails was performed without iatrogenic incident.  Patient was instructed on signs and symptoms of infection and was told to call immediately should any of these arise.

## 2014-08-18 ENCOUNTER — Ambulatory Visit: Payer: Medicare Other | Admitting: Podiatry

## 2014-09-26 ENCOUNTER — Other Ambulatory Visit (HOSPITAL_COMMUNITY): Payer: Self-pay | Admitting: Internal Medicine

## 2014-09-26 DIAGNOSIS — Z1231 Encounter for screening mammogram for malignant neoplasm of breast: Secondary | ICD-10-CM

## 2014-09-29 ENCOUNTER — Other Ambulatory Visit: Payer: Self-pay | Admitting: Orthopedic Surgery

## 2014-09-29 DIAGNOSIS — M48061 Spinal stenosis, lumbar region without neurogenic claudication: Secondary | ICD-10-CM

## 2014-10-07 ENCOUNTER — Ambulatory Visit
Admission: RE | Admit: 2014-10-07 | Discharge: 2014-10-07 | Disposition: A | Discharge status: Home / Self Care | Payer: Medicare Other | Source: Ambulatory Visit | Attending: Orthopedic Surgery | Admitting: Orthopedic Surgery

## 2014-10-07 DIAGNOSIS — M48061 Spinal stenosis, lumbar region without neurogenic claudication: Secondary | ICD-10-CM

## 2014-10-07 MED ORDER — IOHEXOL 180 MG/ML  SOLN
1.0000 mL | Freq: Once | INTRAMUSCULAR | Status: AC | PRN
Start: 2014-10-07 — End: 2014-10-07
  Administered 2014-10-07: 1 mL via EPIDURAL

## 2014-10-07 MED ORDER — METHYLPREDNISOLONE ACETATE 40 MG/ML INJ SUSP (RADIOLOG
120.0000 mg | Freq: Once | INTRAMUSCULAR | Status: AC
Start: 2014-10-07 — End: 2014-10-07
  Administered 2014-10-07: 120 mg via EPIDURAL

## 2014-10-07 NOTE — Discharge Instructions (Signed)

## 2014-10-21 ENCOUNTER — Ambulatory Visit (HOSPITAL_COMMUNITY)
Admission: RE | Admit: 2014-10-21 | Discharge: 2014-10-21 | Disposition: A | Discharge status: Home / Self Care | Payer: Medicare Other | Source: Ambulatory Visit | Attending: Internal Medicine | Admitting: Internal Medicine

## 2014-10-21 DIAGNOSIS — Z1231 Encounter for screening mammogram for malignant neoplasm of breast: Secondary | ICD-10-CM | POA: Insufficient documentation

## 2015-04-21 ENCOUNTER — Encounter: Payer: Self-pay | Admitting: Podiatry

## 2015-04-21 ENCOUNTER — Ambulatory Visit (INDEPENDENT_AMBULATORY_CARE_PROVIDER_SITE_OTHER): Payer: Medicare Other | Admitting: Podiatry

## 2015-04-21 DIAGNOSIS — B351 Tinea unguium: Secondary | ICD-10-CM

## 2015-04-21 DIAGNOSIS — M79676 Pain in unspecified toe(s): Secondary | ICD-10-CM

## 2015-04-21 NOTE — Progress Notes (Signed)
Patient ID: Cynthia Torres, female   DOB: 1946/02/12, 70 y.o.   MRN: [id number] Complaint:  Visit Type: Patient returns to my office for continued preventative foot care services. Complaint: Patient states" my nails have grown long and thick and become painful to walk and wear shoes" Patient has been diagnosed with DM with no foot complications. The patient presents for preventative foot care services. No changes to ROS. This patient states that she had her great toenails removed by Dr. Blenda Mounts previously, but the nail on both great toes has returned  Podiatric Exam: Vascular: dorsalis pedis and posterior tibial pulses are palpable bilateral. Capillary return is immediate. Temperature gradient is WNL. Skin turgor WNL  Sensorium: Normal Semmes Weinstein monofilament test. Normal tactile sensation bilaterally. Nail Exam: Pt has thick disfigured discolored nails with subungual debris noted bilateral entire nail hallux through fifth toenails Ulcer Exam: There is no evidence of ulcer or pre-ulcerative changes or infection. Orthopedic Exam: Muscle tone and strength are WNL. No limitations in general ROM. No crepitus or effusions noted. Foot type and digits show no abnormalities. Bony prominences are unremarkable. Skin: No Porokeratosis. No infection or ulcers  Diagnosis:  Onychomycosis, , Pain in right toe, pain in left toes  Treatment & Plan Procedures and Treatment: Consent by patient was obtained for treatment procedures. The patient understood the discussion of treatment and procedures well. All questions were answered thoroughly reviewed. Debridement of mycotic and hypertrophic toenails, 1 through 5 bilateral and clearing of subungual debris. No ulceration, no infection noted. To consider nail removal hallux B/L.  Return Visit-Office Procedure: Patient instructed to return to the office for a follow up visit 3 months for continued evaluation and treatment.    Gardiner Barefoot DPM

## 2015-07-28 ENCOUNTER — Ambulatory Visit (INDEPENDENT_AMBULATORY_CARE_PROVIDER_SITE_OTHER): Payer: Medicare Other | Admitting: Podiatry

## 2015-07-28 DIAGNOSIS — M79676 Pain in unspecified toe(s): Secondary | ICD-10-CM

## 2015-07-28 DIAGNOSIS — B351 Tinea unguium: Secondary | ICD-10-CM

## 2015-07-28 NOTE — Progress Notes (Signed)
Patient ID: BRENDEN GAVIA, female   DOB: 1946/02/12, 70 y.o.   MRN: [id number] Complaint:  Visit Type: Patient returns to my office for continued preventative foot care services. Complaint: Patient states" my nails have grown long and thick and become painful to walk and wear shoes" Patient has been diagnosed with DM with no foot complications. The patient presents for preventative foot care services. No changes to ROS. This patient states that she had her great toenails removed by Dr. Blenda Mounts previously, but the nail on both great toes has returned  Podiatric Exam: Vascular: dorsalis pedis and posterior tibial pulses are palpable bilateral. Capillary return is immediate. Temperature gradient is WNL. Skin turgor WNL  Sensorium: Normal Semmes Weinstein monofilament test. Normal tactile sensation bilaterally. Nail Exam: Pt has thick disfigured discolored nails with subungual debris noted bilateral entire nail hallux through fifth toenails Ulcer Exam: There is no evidence of ulcer or pre-ulcerative changes or infection. Orthopedic Exam: Muscle tone and strength are WNL. No limitations in general ROM. No crepitus or effusions noted. Foot type and digits show no abnormalities. Bony prominences are unremarkable. Skin: No Porokeratosis. No infection or ulcers  Diagnosis:  Onychomycosis, , Pain in right toe, pain in left toes  Treatment & Plan Procedures and Treatment: Consent by patient was obtained for treatment procedures. The patient understood the discussion of treatment and procedures well. All questions were answered thoroughly reviewed. Debridement of mycotic and hypertrophic toenails, 1 through 5 bilateral and clearing of subungual debris. No ulceration, no infection noted. To consider nail removal hallux B/L.  Return Visit-Office Procedure: Patient instructed to return to the office for a follow up visit 3 months for continued evaluation and treatment.    Gardiner Barefoot DPM

## 2015-08-23 ENCOUNTER — Other Ambulatory Visit: Payer: Self-pay | Admitting: Internal Medicine

## 2015-08-23 DIAGNOSIS — M79605 Pain in left leg: Secondary | ICD-10-CM

## 2015-08-28 ENCOUNTER — Ambulatory Visit
Admission: RE | Admit: 2015-08-28 | Discharge: 2015-08-28 | Disposition: A | Discharge status: Home / Self Care | Payer: Medicare Other | Source: Ambulatory Visit | Attending: Internal Medicine | Admitting: Internal Medicine

## 2015-08-28 ENCOUNTER — Other Ambulatory Visit: Payer: Medicare Other

## 2015-08-28 DIAGNOSIS — M79605 Pain in left leg: Secondary | ICD-10-CM

## 2015-09-22 ENCOUNTER — Other Ambulatory Visit: Payer: Self-pay | Admitting: Internal Medicine

## 2015-09-22 DIAGNOSIS — Z1231 Encounter for screening mammogram for malignant neoplasm of breast: Secondary | ICD-10-CM

## 2015-10-27 ENCOUNTER — Ambulatory Visit
Admission: RE | Admit: 2015-10-27 | Discharge: 2015-10-27 | Disposition: A | Discharge status: Home / Self Care | Payer: Medicare Other | Source: Ambulatory Visit | Attending: Internal Medicine | Admitting: Internal Medicine

## 2015-10-27 DIAGNOSIS — Z1231 Encounter for screening mammogram for malignant neoplasm of breast: Secondary | ICD-10-CM

## 2015-11-03 ENCOUNTER — Ambulatory Visit
Admission: RE | Admit: 2015-11-03 | Discharge: 2015-11-03 | Disposition: A | Discharge status: Home / Self Care | Payer: Medicare Other | Source: Ambulatory Visit | Attending: Internal Medicine | Admitting: Internal Medicine

## 2015-11-03 ENCOUNTER — Ambulatory Visit: Payer: Medicare Other | Admitting: Podiatry

## 2015-11-03 ENCOUNTER — Other Ambulatory Visit: Payer: Self-pay | Admitting: Internal Medicine

## 2015-11-03 DIAGNOSIS — M25571 Pain in right ankle and joints of right foot: Secondary | ICD-10-CM

## 2015-11-03 DIAGNOSIS — M25572 Pain in left ankle and joints of left foot: Secondary | ICD-10-CM

## 2015-11-10 ENCOUNTER — Ambulatory Visit: Payer: Medicare Other | Admitting: Podiatry

## 2015-12-20 ENCOUNTER — Encounter: Payer: Self-pay | Admitting: Podiatry

## 2015-12-20 ENCOUNTER — Ambulatory Visit (INDEPENDENT_AMBULATORY_CARE_PROVIDER_SITE_OTHER): Payer: Medicare Other | Admitting: Podiatry

## 2015-12-20 VITALS — BP 144/83 | HR 87

## 2015-12-20 DIAGNOSIS — M79676 Pain in unspecified toe(s): Secondary | ICD-10-CM | POA: Diagnosis not present

## 2015-12-20 DIAGNOSIS — E119 Type 2 diabetes mellitus without complications: Secondary | ICD-10-CM

## 2015-12-20 DIAGNOSIS — L84 Corns and callosities: Secondary | ICD-10-CM | POA: Diagnosis not present

## 2015-12-20 DIAGNOSIS — B351 Tinea unguium: Secondary | ICD-10-CM | POA: Diagnosis not present

## 2015-12-20 NOTE — Progress Notes (Signed)
Patient ID: Cynthia Torres, female   DOB: 14-Apr-1945, 70 y.o.   MRN: [id number]   Subjective: This patient presents today complaining of thickened and elongated toenails which are comfortable walking wearing shoes and requests toenail debridement. Also complaining of a painful corn on the second left toe. Patient is diabetic and denies history of foot ulceration, claudication or amputation  Objective: Orientated 3 DP and PT pulses 2/4 bilaterally Capillary reflex immediate bilaterally Sensation to 10 g monofilament wire intact 5/5 bilaterally Vibratory sensation reactive bilaterally Ankle reflex equal and reactive bilaterally No open skin lesions bilaterally The toenails elongated, brittle, deformed, discolored and tender to direct palpation 6-10 Well-organized keratoses medial PIPJ second left toe HAV bilaterally Hammertoe second bilaterally  Assessment: Diabetic without foot complications Satisfactory neurovascular status Symptomatic onychomycoses 6-10 Keratoses 1  Plan: Debridement toenails 6-10 mechanically and electronically without any bleeding Debrided keratoses second left toe with slight bleeding treated with topical antibiotic ointment and Band-Aid. Patient instructed removed Band-Aid 1-3 days and continue to apply topical antibiotic ointment daily to a scab forms  Reappoint 3 months

## 2015-12-20 NOTE — Patient Instructions (Signed)
Removed Band-Aid on second left toe 1-3 days and apply topical antibiotic ointment daily until a scab forms  Diabetes and Foot Care Diabetes may cause you to have problems because of poor blood supply (circulation) to your feet and legs. This may cause the skin on your feet to become thinner, break easier, and heal more slowly. Your skin may become dry, and the skin may peel and crack. You may also have nerve damage in your legs and feet causing decreased feeling in them. You may not notice minor injuries to your feet that could lead to infections or more serious problems. Taking care of your feet is one of the most important things you can do for yourself.  HOME CARE INSTRUCTIONS  Wear shoes at all times, even in the house. Do not go barefoot. Bare feet are easily injured.  Check your feet daily for blisters, cuts, and redness. If you cannot see the bottom of your feet, use a mirror or ask someone for help.  Wash your feet with warm water (do not use hot water) and mild soap. Then pat your feet and the areas between your toes until they are completely dry. Do not soak your feet as this can dry your skin.  Apply a moisturizing lotion or petroleum jelly (that does not contain alcohol and is unscented) to the skin on your feet and to dry, brittle toenails. Do not apply lotion between your toes.  Trim your toenails straight across. Do not dig under them or around the cuticle. File the edges of your nails with an emery board or nail file.  Do not cut corns or calluses or try to remove them with medicine.  Wear clean socks or stockings every day. Make sure they are not too tight. Do not wear knee-high stockings since they may decrease blood flow to your legs.  Wear shoes that fit properly and have enough cushioning. To break in new shoes, wear them for just a few hours a day. This prevents you from injuring your feet. Always look in your shoes before you put them on to be sure there are no objects  inside.  Do not cross your legs. This may decrease the blood flow to your feet.  If you find a minor scrape, cut, or break in the skin on your feet, keep it and the skin around it clean and dry. These areas may be cleansed with mild soap and water. Do not cleanse the area with peroxide, alcohol, or iodine.  When you remove an adhesive bandage, be sure not to damage the skin around it.  If you have a wound, look at it several times a day to make sure it is healing.  Do not use heating pads or hot water bottles. They may burn your skin. If you have lost feeling in your feet or legs, you may not know it is happening until it is too late.  Make sure your health care provider performs a complete foot exam at least annually or more often if you have foot problems. Report any cuts, sores, or bruises to your health care provider immediately. SEEK MEDICAL CARE IF:   You have an injury that is not healing.  You have cuts or breaks in the skin.  You have an ingrown nail.  You notice redness on your legs or feet.  You feel burning or tingling in your legs or feet.  You have pain or cramps in your legs and feet.  Your legs or feet  are numb.  Your feet always feel cold. SEEK IMMEDIATE MEDICAL CARE IF:   There is increasing redness, swelling, or pain in or around a wound.  There is a red line that goes up your leg.  Pus is coming from a wound.  You develop a fever or as directed by your health care provider.  You notice a bad smell coming from an ulcer or wound.   This information is not intended to replace advice given to you by your health care provider. Make sure you discuss any questions you have with your health care provider.   Document Released: 02/16/2000 Document Revised: 10/21/2012 Document Reviewed: 07/28/2012 Elsevier Interactive Patient Education [employer].

## 2015-12-29 ENCOUNTER — Ambulatory Visit (INDEPENDENT_AMBULATORY_CARE_PROVIDER_SITE_OTHER): Payer: Medicare Other | Admitting: Podiatry

## 2015-12-29 ENCOUNTER — Encounter: Payer: Self-pay | Admitting: Podiatry

## 2015-12-29 VITALS — BP 174/90 | HR 83 | Resp 14

## 2015-12-29 DIAGNOSIS — M25572 Pain in left ankle and joints of left foot: Secondary | ICD-10-CM

## 2015-12-29 DIAGNOSIS — S9032XA Contusion of left foot, initial encounter: Secondary | ICD-10-CM

## 2015-12-29 DIAGNOSIS — M19079 Primary osteoarthritis, unspecified ankle and foot: Secondary | ICD-10-CM

## 2015-12-29 MED ORDER — MELOXICAM 15 MG PO TABS: 15.0000 mg | ORAL_TABLET | Freq: Every day | ORAL | 0 refills | Status: DC

## 2015-12-29 MED ORDER — HYDROCODONE-ACETAMINOPHEN 5-325 MG PO TABS: 1.0000 | ORAL_TABLET | Freq: Four times a day (QID) | ORAL | 0 refills | Status: DC | PRN

## 2015-12-29 NOTE — Progress Notes (Signed)
This patient presents to the office with chief complaint of severe pain noted in her left heel. She states the pain started over the weekend and she can hardly bear weight on her left heel as she walks. She was seen in the office for preventive foot care services 1 week ago. She denies any history of trauma or injury to the foot. Patient gives a history of having a similar problem for months ago which was treated by [facility] with injection therapy and medication. She presents the office today for further evaluation and treatment of her left foot   GENERAL APPEARANCE: Alert, conversant. Appropriately groomed. No acute distress.  VASCULAR: Pedal pulses are  palpable at  [facility] and PT bilateral.  Capillary refill time is immediate to all digits,  Normal temperature gradient.  Digital hair growth is present bilateral  NEUROLOGIC: sensation is normal to 5.07 monofilament at 5/5 sites bilateral.  Light touch is intact bilateral, Muscle strength normal.  MUSCULOSKELETAL: acceptable muscle strength, tone and stability bilateral.  Intrinsic muscluature intact bilateral.  HAV  B/L with hammer toe. Evaluation of her left foot and ankle reveals no evidence of any redness or swelling noted to the heel itself. She does have significant pain upon medial and lateral pressure to the calcaneus of the left foot. She has significantly enlarged and swollen left ankle with swelling extending up into her leg .  Sinus tarsi pain noted left foot.  DERMATOLOGIC: skin color, texture, and turgor are within normal limits.  No preulcerative lesions or ulcers  are seen, no interdigital maceration noted.  No open lesions present.  Digital nails are asymptomatic. No drainage noted.   Acute arthritis left ankle  Sinus tarsitis  Heel contusion.   TX.  ROV  x-rays were examined that were previously taken and they reveal the no evidence of any bony pathology to the left heel. There is a talar tilt noted to the ankle and the left  foot. There is the presence of calcification at the insertion of the plantar fascia of the left foot. This patient does give a history of having had gout previously. This arthritic flareup does not appear to be gout since it is not red and inflamed but there is significant pain and discomfort. There is definite arthritis noted in the left ankle. There is significant pain upon medial and lateral pressure to the calcaneus itself. She has developed an acute arthritis of an unknown cause and possibly could have the beginnings of a stress fracture to the left heel. Therefore, I treated her with injection therapy in the sinus tarsi applied an Unna boot prescribed Motrin diabetic as well as pain medicine for this patient. Padding was added to her shoe and she was told to return to the office in one week. She is to call the office if there are any problems   Gardiner Barefoot DPM

## 2016-01-03 ENCOUNTER — Ambulatory Visit (INDEPENDENT_AMBULATORY_CARE_PROVIDER_SITE_OTHER): Payer: Medicare Other

## 2016-01-03 ENCOUNTER — Ambulatory Visit (INDEPENDENT_AMBULATORY_CARE_PROVIDER_SITE_OTHER): Payer: Medicare Other | Admitting: Podiatry

## 2016-01-03 DIAGNOSIS — G8929 Other chronic pain: Secondary | ICD-10-CM

## 2016-01-03 DIAGNOSIS — M7752 Other enthesopathy of left foot: Secondary | ICD-10-CM

## 2016-01-03 DIAGNOSIS — M79672 Pain in left foot: Secondary | ICD-10-CM | POA: Diagnosis not present

## 2016-01-03 DIAGNOSIS — M25572 Pain in left ankle and joints of left foot: Secondary | ICD-10-CM | POA: Diagnosis not present

## 2016-01-03 DIAGNOSIS — M659 Synovitis and tenosynovitis, unspecified: Secondary | ICD-10-CM

## 2016-01-03 MED ORDER — NONFORMULARY OR COMPOUNDED ITEM: 1.0000 g | Freq: Four times a day (QID) | 2 refills | Status: DC

## 2016-01-05 ENCOUNTER — Ambulatory Visit: Payer: Medicare Other | Admitting: Podiatry

## 2016-01-07 MED ORDER — BETAMETHASONE SOD PHOS & ACET 6 (3-3) MG/ML IJ SUSP: 3.0000 mg | Freq: Once | INTRAMUSCULAR | Status: DC

## 2016-01-07 NOTE — Progress Notes (Addendum)
Subjective:  Patient presents today for pain and tenderness to the left ankle. Patient relates significant pain and tenderness when walking.  Patient presents for further treatment and evaluation.  Objective / Physical Exam:  General:  The patient is alert and oriented x3 in no acute distress. Dermatology:  Skin is warm, dry and supple bilateral lower extremities. Negative for open lesions or macerations. Vascular:  Palpable pedal pulses bilaterally. No edema or erythema noted. Capillary refill within normal limits. Neurological:  Epicritic and protective threshold grossly intact bilaterally.  Musculoskeletal Exam:  Pain on palpation to the anterior lateral medial aspects of the patient's left ankle. Mild edema noted.  Range of motion within normal limits to all pedal and ankle joints bilateral. Muscle strength 5/5 in all groups bilateral.   Radiographic Exam:  Normal osseous mineralization. Joint spaces preserved. No fracture/dislocation/boney destruction.    Assessment: #1 pain in left ankle #2 synovitis of left ankle #3 capsulitis of left ankle  Plan of Care:  #1 Patient was evaluated. #2 injection of 0.5 mL Celestone Soluspan injected in the patient's left ankle. #3 prescription for anti-inflammatory pain cream dispensed through [facility]  #4 ankle brace dispensed #5 patient is to return to clinic in 4 weeks   Dr. Edrick Kins, [place]

## 2016-01-31 ENCOUNTER — Ambulatory Visit: Payer: Medicare Other | Admitting: Podiatry

## 2016-03-20 ENCOUNTER — Ambulatory Visit: Payer: Medicare Other | Admitting: Podiatry

## 2016-04-09 ENCOUNTER — Ambulatory Visit: Payer: Medicare Other | Admitting: Podiatry

## 2016-04-23 ENCOUNTER — Encounter: Payer: Self-pay | Admitting: Podiatry

## 2016-04-23 ENCOUNTER — Ambulatory Visit (INDEPENDENT_AMBULATORY_CARE_PROVIDER_SITE_OTHER): Payer: Medicare Other | Admitting: Podiatry

## 2016-04-23 DIAGNOSIS — E119 Type 2 diabetes mellitus without complications: Secondary | ICD-10-CM

## 2016-04-23 DIAGNOSIS — M79676 Pain in unspecified toe(s): Secondary | ICD-10-CM

## 2016-04-23 DIAGNOSIS — L84 Corns and callosities: Secondary | ICD-10-CM | POA: Diagnosis not present

## 2016-04-23 DIAGNOSIS — B351 Tinea unguium: Secondary | ICD-10-CM | POA: Diagnosis not present

## 2016-04-23 NOTE — Progress Notes (Signed)
Patient ID: Cynthia Torres, female   DOB: 1945/09/17, 71 y.o.   MRN: [id number]    Subjective: This patient presents today complaining of thickened and elongated toenails which are comfortable walking wearing shoes and requests toenail debridement. Also complaining of a painful corn on the second left toe. Patient is diabetic and denies history of foot ulceration, claudication or amputation  Objective: Orientated 3 DP and PT pulses 2/4 bilaterally Capillary reflex immediate bilaterally Sensation to 10 g monofilament wire intact 5/5 bilaterally Vibratory sensation reactive bilaterally Ankle reflex equal and reactive bilaterally No open skin lesions bilaterally The toenails elongated, brittle, deformed, discolored and tender to direct palpation 6-10 Well-organized keratoses medial PIPJ second left toe and lateral right foot HAV bilaterally Hammertoe second bilaterally  Assessment: Diabetic without foot complications Satisfactory neurovascular status Symptomatic onychomycoses 6-10 Keratoses   Plan: Debridement toenails 6-10 mechanically and electronically without any bleeding Debrided keratoses 2 without any bleeding Reappoint 3 months

## 2016-04-23 NOTE — Patient Instructions (Signed)

## 2016-04-24 ENCOUNTER — Ambulatory Visit: Payer: Medicare Other | Admitting: Podiatry

## 2016-07-17 ENCOUNTER — Other Ambulatory Visit: Payer: Self-pay | Admitting: Internal Medicine

## 2016-07-17 DIAGNOSIS — M79605 Pain in left leg: Secondary | ICD-10-CM

## 2016-07-17 DIAGNOSIS — M79604 Pain in right leg: Secondary | ICD-10-CM

## 2016-07-19 ENCOUNTER — Ambulatory Visit
Admission: RE | Admit: 2016-07-19 | Discharge: 2016-07-19 | Disposition: A | Discharge status: Home / Self Care | Payer: Medicare Other | Source: Ambulatory Visit | Attending: Internal Medicine | Admitting: Internal Medicine

## 2016-07-19 DIAGNOSIS — M79604 Pain in right leg: Secondary | ICD-10-CM

## 2016-07-19 DIAGNOSIS — M79605 Pain in left leg: Secondary | ICD-10-CM

## 2016-07-23 ENCOUNTER — Ambulatory Visit (INDEPENDENT_AMBULATORY_CARE_PROVIDER_SITE_OTHER): Payer: Medicare Other | Admitting: Podiatry

## 2016-07-23 DIAGNOSIS — B351 Tinea unguium: Secondary | ICD-10-CM | POA: Diagnosis not present

## 2016-07-23 DIAGNOSIS — M79676 Pain in unspecified toe(s): Secondary | ICD-10-CM

## 2016-07-23 DIAGNOSIS — L84 Corns and callosities: Secondary | ICD-10-CM | POA: Diagnosis not present

## 2016-07-23 DIAGNOSIS — E119 Type 2 diabetes mellitus without complications: Secondary | ICD-10-CM

## 2016-07-23 NOTE — Progress Notes (Signed)
Patient ID: Cynthia Torres, female   DOB: 06/14/1945, 71 y.o.   MRN: [id number]   Subjective: This patient presents today complaining of thickened and elongated toenails which are comfortable walking wearing shoes and requests toenail debridement. Also complaining of a painful corn on the second left toe. Patient is diabetic and denies history of foot ulceration, claudication or amputation  Objective: Orientated 3 DP and PT pulses 2/4 bilaterally Capillary reflex immediate bilaterally Sensation to 10 g monofilament wire intact 5/5 bilaterally Vibratory sensation reactive bilaterally Ankle reflex equal and reactive bilaterally No open skin lesions bilaterally The toenails elongated, brittle, deformed, discolored and tender to direct palpation 6-10 Atrophic skin with absent hair growth bilaterally Well-organized keratoses medial PIPJ second left toe  HAV bilaterally Hammertoe second bilaterally  Assessment: Diabetic without foot complications Satisfactory neurovascular status Symptomatic onychomycoses 6-10 Keratoses 1  Plan: Debridement toenails 6-10 mechanically and electronically without anybleeding Debrided keratoses 1 without any bleeding Reappoint  10 weeks

## 2016-07-23 NOTE — Patient Instructions (Signed)

## 2016-09-20 ENCOUNTER — Other Ambulatory Visit: Payer: Self-pay | Admitting: Internal Medicine

## 2016-09-20 DIAGNOSIS — Z0000 Encounter for general adult medical examination without abnormal findings: Secondary | ICD-10-CM

## 2016-10-08 ENCOUNTER — Encounter: Payer: Self-pay | Admitting: Podiatry

## 2016-10-08 ENCOUNTER — Ambulatory Visit (INDEPENDENT_AMBULATORY_CARE_PROVIDER_SITE_OTHER): Payer: Medicare Other | Admitting: Podiatry

## 2016-10-08 ENCOUNTER — Other Ambulatory Visit: Payer: Self-pay | Admitting: Podiatry

## 2016-10-08 ENCOUNTER — Ambulatory Visit
Admission: RE | Admit: 2016-10-08 | Discharge: 2016-10-08 | Disposition: A | Discharge status: Home / Self Care | Payer: Medicare Other | Source: Ambulatory Visit | Attending: Podiatry | Admitting: Podiatry

## 2016-10-08 ENCOUNTER — Telehealth: Payer: Self-pay | Admitting: *Deleted

## 2016-10-08 VITALS — BP 146/88 | HR 69

## 2016-10-08 DIAGNOSIS — B351 Tinea unguium: Secondary | ICD-10-CM | POA: Diagnosis not present

## 2016-10-08 DIAGNOSIS — E119 Type 2 diabetes mellitus without complications: Secondary | ICD-10-CM

## 2016-10-08 DIAGNOSIS — M79676 Pain in unspecified toe(s): Secondary | ICD-10-CM

## 2016-10-08 DIAGNOSIS — R52 Pain, unspecified: Secondary | ICD-10-CM

## 2016-10-08 DIAGNOSIS — L84 Corns and callosities: Secondary | ICD-10-CM

## 2016-10-08 DIAGNOSIS — R609 Edema, unspecified: Secondary | ICD-10-CM

## 2016-10-08 DIAGNOSIS — M7989 Other specified soft tissue disorders: Secondary | ICD-10-CM | POA: Diagnosis not present

## 2016-10-08 DIAGNOSIS — M79662 Pain in left lower leg: Secondary | ICD-10-CM

## 2016-10-08 NOTE — Progress Notes (Signed)
   Subjective:    Patient ID: Cynthia Torres, female    DOB: Aug 12, 1945, 71 y.o.   MRN: [id number]  HPI    Review of Systems  All other systems reviewed and are negative.      Objective:   Physical Exam        Assessment & Plan:

## 2016-10-08 NOTE — Progress Notes (Signed)
Patient ID: Cynthia Torres, female   DOB: June 29, 1945, 71 y.o.   MRN: [id number]   Subjective: This patient presents for scheduled visit complaining of elongated and thickened toenails which from cough walking wearing shoes and request toenail debridement. Also, patient complaining of three-day history of left calf and left ankle pain on and off weightbearing. Patient describes taking aspirin for relief with minimal improvement. She describes previous episode of this approximately 2 months ago and describes a vascular exam to rule out a clot and per patient patient was advised she had no clot  increased diuretic. Patient also relates approximately 2 year history of a previous DVT in the left lower extremity  Objective: Orientated 3   Orientated 3 DP and PT pulses 2/4 bilaterally Edema left calf and left ankle Palpable tenderness left calf Capillary reflex immediate bilaterally Sensation to 10 g monofilament wire intact 5/5 bilaterally Vibratory sensation reactive bilaterally Ankle reflex equal and reactive bilaterally No open skin lesions bilaterally The toenails elongated, brittle, deformed, discolored and tender to direct palpation 6-10 Atrophic skin with absent hair growth bilaterally Well-organized keratoses medial PIPJ second left toe  HAV bilaterally Hammertoe second bilaterally  Assessment: Rule out lower extremity DVT left Mycotic toenails 6-10 with symptoms  Plan: Patient referred to vascular lab for lower extremity venous Doppler for the indication of calf pain and calf edema left. Patient instructed that if lab positive for DVT will need to have immediate medical attention  Debridement of toenails 6-10 mechanically illogical without any bleeding

## 2016-10-08 NOTE — Telephone Encounter (Addendum)
-----   Message from Tonye Pearson, RMA sent at 10/08/2016  8:28 AM EDT ----- Regarding: Venus Doppler Good Morning: Dr Amalia Hailey is requesting an lower  extrememity venus doppler, left leg and ankle, calf pain swelling x 3 days. I spoke with [facility] scheduled pt for today 10:00am for left venous doppler for pain and swelling of left calf. 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 I told pt that it was an emergency situation, if there was a clot and it moved it could go to her lungs and she would have a very big problem. Pt states she will go to the patient's house and bathe her then get patient's sister to care for her until she returned.

## 2016-10-08 NOTE — Patient Instructions (Signed)
Today there is approximately 3 day history of pain and swelling in her left calf/ankle Referring you to the vascular lab for a lower extremity venous Doppler If positive for clot will need to have immediate medical attention  Diabetes and Foot Care Diabetes may cause you to have problems because of poor blood supply (circulation) to your feet and legs. This may cause the skin on your feet to become thinner, break easier, and heal more slowly. Your skin may become dry, and the skin may peel and crack. You may also have nerve damage in your legs and feet causing decreased feeling in them. You may not notice minor injuries to your feet that could lead to infections or more serious problems. Taking care of your feet is one of the most important things you can do for yourself. Follow these instructions at home:  Wear shoes at all times, even in the house. Do not go barefoot. Bare feet are easily injured.  Check your feet daily for blisters, cuts, and redness. If you cannot see the bottom of your feet, use a mirror or ask someone for help.  Wash your feet with warm water (do not use hot water) and mild soap. Then pat your feet and the areas between your toes until they are completely dry. Do not soak your feet as this can dry your skin.  Apply a moisturizing lotion or petroleum jelly (that does not contain alcohol and is unscented) to the skin on your feet and to dry, brittle toenails. Do not apply lotion between your toes.  Trim your toenails straight across. Do not dig under them or around the cuticle. File the edges of your nails with an emery board or nail file.  Do not cut corns or calluses or try to remove them with medicine.  Wear clean socks or stockings every day. Make sure they are not too tight. Do not wear knee-high stockings since they may decrease blood flow to your legs.  Wear shoes that fit properly and have enough cushioning. To break in new shoes, wear them for just a few hours a  day. This prevents you from injuring your feet. Always look in your shoes before you put them on to be sure there are no objects inside.  Do not cross your legs. This may decrease the blood flow to your feet.  If you find a minor scrape, cut, or break in the skin on your feet, keep it and the skin around it clean and dry. These areas may be cleansed with mild soap and water. Do not cleanse the area with peroxide, alcohol, or iodine.  When you remove an adhesive bandage, be sure not to damage the skin around it.  If you have a wound, look at it several times a day to make sure it is healing.  Do not use heating pads or hot water bottles. They may burn your skin. If you have lost feeling in your feet or legs, you may not know it is happening until it is too late.  Make sure your health care provider performs a complete foot exam at least annually or more often if you have foot problems. Report any cuts, sores, or bruises to your health care provider immediately. Contact a health care provider if:  You have an injury that is not healing.  You have cuts or breaks in the skin.  You have an ingrown nail.  You notice redness on your legs or feet.  You feel burning or  tingling in your legs or feet.  You have pain or cramps in your legs and feet.  Your legs or feet are numb.  Your feet always feel cold. Get help right away if:  There is increasing redness, swelling, or pain in or around a wound.  There is a red line that goes up your leg.  Pus is coming from a wound.  You develop a fever or as directed by your health care provider.  You notice a bad smell coming from an ulcer or wound. This information is not intended to replace advice given to you by your health care provider. Make sure you discuss any questions you have with your health care provider. Document Released: 02/16/2000 Document Revised: 07/27/2015 Document Reviewed: 07/28/2012 Elsevier Interactive Patient Education   2017 [employer].

## 2016-10-11 NOTE — Telephone Encounter (Addendum)
-----   Message from Gean Birchwood, DPM sent at 10/09/2016  7:37 AM EDT ----- IMPRESSION: Sonographic survey of the left lower extremity negative for DVT  Contact patient and informed her that the vascular exam was negative for clot and no follow-up indicated on this vascular examination. 10/11/2016-I informed pt of Dr. Phoebe Perch review of doppler results and she states she went to her primary and he thinks it is gout since it is still swollen, and drew blood.

## 2016-10-28 ENCOUNTER — Ambulatory Visit
Admission: RE | Admit: 2016-10-28 | Discharge: 2016-10-28 | Disposition: A | Discharge status: Home / Self Care | Payer: Medicare Other | Source: Ambulatory Visit | Attending: Internal Medicine | Admitting: Internal Medicine

## 2016-10-28 DIAGNOSIS — Z0000 Encounter for general adult medical examination without abnormal findings: Secondary | ICD-10-CM

## 2016-12-10 ENCOUNTER — Encounter: Payer: Self-pay | Admitting: Podiatry

## 2016-12-10 ENCOUNTER — Ambulatory Visit (INDEPENDENT_AMBULATORY_CARE_PROVIDER_SITE_OTHER): Payer: Medicare Other | Admitting: Podiatry

## 2016-12-10 DIAGNOSIS — M79676 Pain in unspecified toe(s): Secondary | ICD-10-CM

## 2016-12-10 DIAGNOSIS — B351 Tinea unguium: Secondary | ICD-10-CM

## 2016-12-10 NOTE — Progress Notes (Signed)
Patient ID: Cynthia Torres, female   DOB: 10/27/1945, 71 y.o.   MRN: [id number]   Subjective: Patient presents again for scheduled visit complaining of thickened and elongated toenails which are uncomfortable when walking wearing shoes. Patient had undergone lower extremity venous Doppler ordered on the visit of 10/08/2016 because the painful left calf. The ultrasound examination 10/09/2016 was negative for DVT, left lower extremity  Objective: Orientated 3 DP and PT pulses 2/4 bilaterally Capillary reflex immediate bilaterally Sensation to 10 g monofilament wire intact 5/5 bilaterally Vibratory sensation reactive bilaterally Ankle reflex equal and reactive bilaterally No open skin lesions bilaterally The toenails elongated, brittle, deformed, discolored and tender to direct palpation 6-10 Atrophic skin with absent hair growth bilaterally Well-organized keratoses medial PIPJ second left toe  HAV bilaterally Hammertoe second bilaterally  Assessment: Diabetic without foot complications Satisfactory neurovascular status Symptomatic onychomycoses 6-10 Keratoses 1  Plan: Debridement toenails 6-10 mechanically and electronically without anybleeding Debrided keratoses 1 without any bleeding  Reappoint 3 months

## 2017-03-10 ENCOUNTER — Ambulatory Visit: Payer: Medicare Other | Admitting: Podiatry

## 2017-03-14 ENCOUNTER — Encounter: Payer: Self-pay | Admitting: Podiatry

## 2017-03-14 ENCOUNTER — Ambulatory Visit: Payer: Medicare Other | Admitting: Podiatry

## 2017-03-14 DIAGNOSIS — L84 Corns and callosities: Secondary | ICD-10-CM | POA: Diagnosis not present

## 2017-03-14 DIAGNOSIS — E119 Type 2 diabetes mellitus without complications: Secondary | ICD-10-CM

## 2017-03-14 DIAGNOSIS — B351 Tinea unguium: Secondary | ICD-10-CM

## 2017-03-14 DIAGNOSIS — M79676 Pain in unspecified toe(s): Secondary | ICD-10-CM | POA: Diagnosis not present

## 2017-03-14 NOTE — Progress Notes (Signed)
Subjective:   Patient ID: Cynthia Torres, female   DOB: 72 y.o.   MRN: [id number]   HPI Patient presents with severe thickness of nailbeds 1-5 both feet with incurvation and has a distal keratotic lesion digit 2 left it's painful   ROS      Objective:  Physical Exam  Neurovascular status intact with patient found to have severe nail disease 1-5 both feet with thick yellow brittle beds that are painful and keratotic lesion second digit left     Assessment:  Mycotic infection nails 1-5 both feet with pain and lesion second digit left with keratotic tissue     Plan:  Reviewed condition and debrided nailbeds today 1-5 both feet with no iatrogenic bleeding and lesion second left with no iatrogenic bleeding noted

## 2017-06-13 ENCOUNTER — Ambulatory Visit (INDEPENDENT_AMBULATORY_CARE_PROVIDER_SITE_OTHER): Payer: Medicare Other | Admitting: Podiatry

## 2017-06-13 ENCOUNTER — Encounter: Payer: Self-pay | Admitting: Podiatry

## 2017-06-13 DIAGNOSIS — E119 Type 2 diabetes mellitus without complications: Secondary | ICD-10-CM

## 2017-06-13 DIAGNOSIS — M79676 Pain in unspecified toe(s): Secondary | ICD-10-CM | POA: Diagnosis not present

## 2017-06-13 DIAGNOSIS — B351 Tinea unguium: Secondary | ICD-10-CM | POA: Diagnosis not present

## 2017-06-13 DIAGNOSIS — L84 Corns and callosities: Secondary | ICD-10-CM | POA: Diagnosis not present

## 2017-06-15 NOTE — Progress Notes (Signed)
Subjective:   Patient ID: Cynthia Torres, female   DOB: 72 y.o.   MRN: [id number]   HPI Patient presents with painful nails 1-5 both feet and lesion formation sub-fifth metatarsal head bilateral that are painful.  Patient states she cannot cut her nails or the lesions   ROS      Objective:  Physical Exam  Neurovascular status intact with thick yellow brittle nailbeds 1-5 both feet that are painful with thick keratotic lesion sub-5 both feet that are painful     Assessment:  Mycotic nail infection with pain 1-5 both feet with lesion formation bilateral     Plan:  H&P condition reviewed and debrided nailbeds 1-5 both feet with no iatrogenic bleeding and lesions on both feet with no iatrogenic bleeding and patient will be seen back to recheck

## 2017-09-12 ENCOUNTER — Ambulatory Visit: Payer: Medicare Other | Admitting: Podiatry

## 2017-09-12 ENCOUNTER — Encounter: Payer: Self-pay | Admitting: Podiatry

## 2017-09-12 DIAGNOSIS — L84 Corns and callosities: Secondary | ICD-10-CM

## 2017-09-12 DIAGNOSIS — B351 Tinea unguium: Secondary | ICD-10-CM

## 2017-09-12 DIAGNOSIS — M79676 Pain in unspecified toe(s): Secondary | ICD-10-CM | POA: Diagnosis not present

## 2017-09-12 DIAGNOSIS — E119 Type 2 diabetes mellitus without complications: Secondary | ICD-10-CM | POA: Diagnosis not present

## 2017-09-14 NOTE — Progress Notes (Signed)
Subjective: [name] presents to clinic today with diabetes and cc of painful, discolored, thick toenails which interfere with activities of daily living. Patient has secondary complaint of painful corn noted on left 2nd digit. Pain is aggravated when wearing enclosed shoe gear. Keeping her left great and left 2nd toes separated eases pain. Pain for both is relieved with periodic professional debridement.  Objective: There were no vitals filed for this visit. Vascular Examination: Capillary refill time <3 seconds x 10 digits Dorsalis pedis and posterior tibial pulses present b/l No digital hair x 10 digits Skin temperature warm to warm b/l  Dermatological Examination: Skin with normal turgor, texture and tone b/l Toenails 1-5 b/l discolored, thick, dystrophic with subungual debris and pain with palpation to nailbeds due to thickness of nails. Hyperkeratotic lesion noted dorsomedial aspect of left 2nd toe with subdermal hemorrhage. No flocculence, no edema, no erythema, no digital warmth.  Musculoskeletal: Muscle strength 5/5 to all LE muscle groups Hammertoe deformity left 2nd digit  Neurological: Sensation intact with 10 gram monofilament. Vibratory sensation intact.  Assessment: 1. Painful onychomycosis toenails 1-5 b/l  2. Painful preulcerative corn dorsomedial aspect left 2nd digit 3. NIDDM  Plan: 1. Toenails 1-5 b/l were debrided in length and girth without iatrogenic bleeding. 2. Corn debrided left 2nd digit 3. Patient to continue soft, supportive shoe gear 4. Patient to report any pedal injuries to medical professional immediately. 5. Follow up 3 months.  6. Patient to call should there be a concern in the interim.

## 2017-09-23 ENCOUNTER — Other Ambulatory Visit: Payer: Self-pay | Admitting: Internal Medicine

## 2017-09-23 DIAGNOSIS — Z1231 Encounter for screening mammogram for malignant neoplasm of breast: Secondary | ICD-10-CM

## 2017-11-10 ENCOUNTER — Ambulatory Visit
Admission: RE | Admit: 2017-11-10 | Discharge: 2017-11-10 | Disposition: A | Discharge status: Home / Self Care | Payer: Medicare Other | Source: Ambulatory Visit | Attending: Internal Medicine | Admitting: Internal Medicine

## 2017-11-10 DIAGNOSIS — Z1231 Encounter for screening mammogram for malignant neoplasm of breast: Secondary | ICD-10-CM

## 2017-12-19 ENCOUNTER — Ambulatory Visit: Payer: Medicare Other | Admitting: Podiatry

## 2017-12-19 ENCOUNTER — Encounter: Payer: Self-pay | Admitting: Podiatry

## 2017-12-19 DIAGNOSIS — E119 Type 2 diabetes mellitus without complications: Secondary | ICD-10-CM

## 2017-12-19 DIAGNOSIS — B351 Tinea unguium: Secondary | ICD-10-CM

## 2017-12-19 DIAGNOSIS — M79674 Pain in right toe(s): Secondary | ICD-10-CM | POA: Diagnosis not present

## 2017-12-19 DIAGNOSIS — M79675 Pain in left toe(s): Secondary | ICD-10-CM | POA: Diagnosis not present

## 2017-12-19 DIAGNOSIS — L84 Corns and callosities: Secondary | ICD-10-CM

## 2017-12-22 NOTE — Progress Notes (Signed)
Subjective: Cynthia Torres is a 72 y.o. y.o. female who presents today with for preventative foot care. She has chronic painful mycotic toenails and painful corn right 2nd toe. Both respond to periodic professional debridement.   Today, she relates a lesion on the outside of her right foot which is hard and calloused. She is unsure when it appeared. She has slight discomfort when wearing enclosed shoe gear. She denies any redness, swelling, drainage.  Objective:  Neurovascular status unchanged  Dermatological Examination: Skin with normal turgor, texture and tone b/l Toenails 1-5 b/l discolored, thick, dystrophic with subungual debris and pain with palpation to nailbeds due to thickness of nails. Hyperkeratotic lesion noted dorsomedial aspect of left 2nd toe with subdermal hemorrhage. No flocculence, no edema, no erythema, no digital warmth. Hyperkeratotic lesion noted dorsolateral aspect right foot near the 5th metatarsal. No erythema, no edema, no drainage. No asymmetrical borders, no bleeding, flesh colored, no palpable depth.  Musculoskeletal: Muscle strength 5/5 to all LE muscle groups Hammertoe deformity left 2nd digit  Assessment: 1. Painful onychomycosis toenails 1-5 b/l 2.  Corn left 2nd digit, right foot 3.  NIDDM  Plan: 1. Continue diabetic foot care principles.  2. Toenails 1-5 b/l were debrided in length and girth without iatrogenic bleeding. 3. Dispensed silicone toe cap and silicone toe separator for left 2nd toe. She is to apply in the mornings and remove every evening when wearing enclosed shoe gear. Hyperkeratotic lesions pared left 2nd digit, right foot. Pinpoint bleeding left 2nd digit treated with Lumicain. TAO and bandaid applied. She may remove bandaid on tomorrow. No further treatment required by patient. 4. Patient to continue soft, supportive shoe gear 5. Patient to report any pedal injuries to medical professional  6. Follow up 3 months.  7. Patient/POA to  call should there be a concern in the interim.

## 2017-12-25 ENCOUNTER — Other Ambulatory Visit: Payer: Self-pay | Admitting: Ophthalmology

## 2017-12-25 DIAGNOSIS — H02409 Unspecified ptosis of unspecified eyelid: Secondary | ICD-10-CM

## 2017-12-25 DIAGNOSIS — H5704 Mydriasis: Secondary | ICD-10-CM

## 2018-01-03 ENCOUNTER — Ambulatory Visit
Admission: RE | Admit: 2018-01-03 | Discharge: 2018-01-03 | Disposition: A | Discharge status: Home / Self Care | Payer: Medicare Other | Source: Ambulatory Visit | Attending: Ophthalmology | Admitting: Ophthalmology

## 2018-01-03 DIAGNOSIS — H02409 Unspecified ptosis of unspecified eyelid: Secondary | ICD-10-CM

## 2018-01-03 DIAGNOSIS — H5704 Mydriasis: Secondary | ICD-10-CM

## 2018-01-03 MED ORDER — GADOBENATE DIMEGLUMINE 529 MG/ML IV SOLN
20.0000 mL | Freq: Once | INTRAVENOUS | Status: AC | PRN
  Administered 2018-01-03: 20 mL via INTRAVENOUS

## 2018-02-23 DIAGNOSIS — G529 Cranial nerve disorder, unspecified: Secondary | ICD-10-CM | POA: Insufficient documentation

## 2018-03-20 ENCOUNTER — Ambulatory Visit: Payer: Medicare Other | Admitting: Podiatry

## 2018-03-20 ENCOUNTER — Encounter: Payer: Self-pay | Admitting: Podiatry

## 2018-03-20 DIAGNOSIS — E119 Type 2 diabetes mellitus without complications: Secondary | ICD-10-CM | POA: Diagnosis not present

## 2018-03-20 DIAGNOSIS — M79676 Pain in unspecified toe(s): Secondary | ICD-10-CM | POA: Diagnosis not present

## 2018-03-20 DIAGNOSIS — L84 Corns and callosities: Secondary | ICD-10-CM

## 2018-03-20 DIAGNOSIS — B351 Tinea unguium: Secondary | ICD-10-CM

## 2018-03-20 NOTE — Patient Instructions (Addendum)
Onychomycosis/Fungal Toenails  WHAT IS IT? An infection that lies within the keratin of your nail plate that is caused by a fungus.  WHY ME? Fungal infections affect all ages, sexes, races, and creeds.  There may be many factors that predispose you to a fungal infection such as age, coexisting medical conditions such as diabetes, or an autoimmune disease; stress, medications, fatigue, genetics, etc.  Bottom line: fungus thrives in a warm, moist environment and your shoes offer such a location.  IS IT CONTAGIOUS? Theoretically, yes.  You do not want to share shoes, nail clippers or files with someone who has fungal toenails.  Walking around barefoot in the same room or sleeping in the same bed is unlikely to transfer the organism.  It is important to realize, however, that fungus can spread easily from one nail to the next on the same foot.  HOW DO WE TREAT THIS?  There are several ways to treat this condition.  Treatment may depend on many factors such as age, medications, pregnancy, liver and kidney conditions, etc.  It is best to ask your doctor which options are available to you.  1. No treatment.   Unlike many other medical concerns, you can live with this condition.  However for many people this can be a painful condition and may lead to ingrown toenails or a bacterial infection.  It is recommended that you keep the nails cut short to help reduce the amount of fungal nail. 2. Topical treatment.  These range from herbal remedies to prescription strength nail lacquers.  About 40-50% effective, topicals require twice daily application for approximately 9 to 12 months or until an entirely new nail has grown out.  The most effective topicals are medical grade medications available through physicians offices. 3. Oral antifungal medications.  With an 80-90% cure rate, the most common oral medication requires 3 to 4 months of therapy and stays in your system for a year as the new nail grows out.  Oral  antifungal medications do require blood work to make sure it is a safe drug for you.  A liver function panel will be performed prior to starting the medication and after the first month of treatment.  It is important to have the blood work performed to avoid any harmful side effects.  In general, this medication safe but blood work is required. 4. Laser Therapy.  This treatment is performed by applying a specialized laser to the affected nail plate.  This therapy is noninvasive, fast, and non-painful.  It is not covered by insurance and is therefore, out of pocket.  The results have been very good with a 80-95% cure rate.  [facility] is the only practice in the area to offer this therapy. 5. Permanent Nail Avulsion.  Removing the entire nail so that a new nail will not grow back.  Corns and Calluses Corns are small areas of thickened skin that occur on the top, sides, or tip of a toe. They contain a cone-shaped core with a point that can press on a nerve below. This causes pain.  Calluses are areas of thickened skin that can occur anywhere on the body, including the hands, fingers, palms, soles of the feet, and heels. Calluses are usually larger than corns. What are the causes? Corns and calluses are caused by rubbing (friction) or pressure, such as from shoes that are too tight or do not fit properly. What increases the risk? Corns are more likely to develop in people   who have misshapen toes (toe deformities), such as hammer toes. Calluses can occur with friction to any area of the skin. They are more likely to develop in people who:  Work with their hands.  Wear shoes that fit poorly, are too tight, or are high-heeled.  Have toe deformities. What are the signs or symptoms? Symptoms of a corn or callus include:  A hard growth on the skin.  Pain or tenderness under the skin.  Redness and swelling.  Increased discomfort while wearing tight-fitting shoes, if your feet are  affected. If a corn or callus becomes infected, symptoms may include:  Redness and swelling that gets worse.  Pain.  Fluid, blood, or pus draining from the corn or callus. How is this diagnosed? Corns and calluses may be diagnosed based on your symptoms, your medical history, and a physical exam. How is this treated? Treatment for corns and calluses may include:  Removing the cause of the friction or pressure. This may involve: ? Changing your shoes. ? Wearing shoe inserts (orthotics) or other protective layers in your shoes, such as a corn pad. ? Wearing gloves.  Applying medicine to the skin (topical medicine) to help soften skin in the hardened, thickened areas.  Removing layers of dead skin with a file to reduce the size of the corn or callus.  Removing the corn or callus with a scalpel or laser.  Taking antibiotic medicines, if your corn or callus is infected.  Having surgery, if a toe deformity is the cause. Follow these instructions at home:   Take over-the-counter and prescription medicines only as told by your health care provider.  If you were prescribed an antibiotic, take it as told by your health care provider. Do not stop taking it even if your condition starts to improve.  Wear shoes that fit well. Avoid wearing high-heeled shoes and shoes that are too tight or too loose.  Wear any padding, protective layers, gloves, or orthotics as told by your health care provider.  Soak your hands or feet and then use a file or pumice stone to soften your corn or callus. Do this as told by your health care provider.  Check your corn or callus every day for symptoms of infection. Contact a health care provider if you:  Notice that your symptoms do not improve with treatment.  Have redness or swelling that gets worse.  Notice that your corn or callus becomes painful.  Have fluid, blood, or pus coming from your corn or callus.  Have new symptoms. Summary  Corns are  small areas of thickened skin that occur on the top, sides, or tip of a toe.  Calluses are areas of thickened skin that can occur anywhere on the body, including the hands, fingers, palms, and soles of the feet. Calluses are usually larger than corns.  Corns and calluses are caused by rubbing (friction) or pressure, such as from shoes that are too tight or do not fit properly.  Treatment may include wearing any padding, protective layers, gloves, or orthotics as told by your health care provider. This information is not intended to replace advice given to you by your health care provider. Make sure you discuss any questions you have with your health care provider. Document Released: 11/25/2003 Document Revised: 01/01/2017 Document Reviewed: 01/01/2017 Elsevier Interactive Patient Education  2019 [employer]  

## 2018-03-20 NOTE — Progress Notes (Signed)
Subjective: Ms. Agent presents to clinic today with for preventative foot care. She has chronic painful mycotic toenails and painful corn right 2nd toe. Both respond to periodic professional debridement.   She relates no new problems on today's visit.  Objective: 73 y.o. pleasant AAF, obese, in NAD.  AAO x 3. Vascular Examination: DP/PT pulses palpable b/l  CFT <3 seconds x 10 digits No digital hair x 10 digits Skin temperature warm to warm b/l  Dermatological Examination: Skin with normal turgor, texture and tone b/l  Toenails 1-5 b/l discolored, thick, dystrophic with subungual debris and pain with palpation to nailbeds due to thickness of nails.  Hyperkeratotic lesion noted dorsomedial aspect of left 2nd toe with subdermal hemorrhage. No flocculence, no edema, no erythema, no digital warmth.  Musculoskeletal: Muscle strength 5/5 to all LE muscle groups Hammertoe deformity left 2nd digit  Assessment: 1. Painful onychomycosis toenails 1-5 b/l 2.  Corn left 2nd digit, right foot 3.  NIDDM  Plan: 1. Continue diabetic foot care principles.  2. Toenails 1-5 b/l were debrided in length and girth without iatrogenic bleeding. 3. Corn debrided left 2nd toe 4. Dispensed silicone toe cap and silicone toe separator for left 2nd toe. She is to apply in the mornings and remove every evening when wearing enclosed shoe gear.  5. Patient to continue soft, supportive shoe gear 6. Patient to report any pedal injuries to medical professional  7. Follow up 3 months.  8. Patient/POA to call should there be a concern in the interim.

## 2018-06-05 ENCOUNTER — Ambulatory Visit: Payer: Medicare Other | Admitting: Podiatry

## 2018-06-19 ENCOUNTER — Ambulatory Visit: Payer: Medicare Other | Admitting: Podiatry

## 2018-06-26 ENCOUNTER — Ambulatory Visit: Payer: Medicare Other | Admitting: Podiatry

## 2018-06-26 ENCOUNTER — Encounter: Payer: Self-pay | Admitting: Podiatry

## 2018-06-26 ENCOUNTER — Other Ambulatory Visit: Payer: Self-pay

## 2018-06-26 VITALS — Temp 98.1°F

## 2018-06-26 DIAGNOSIS — L84 Corns and callosities: Secondary | ICD-10-CM

## 2018-06-26 DIAGNOSIS — M79676 Pain in unspecified toe(s): Secondary | ICD-10-CM

## 2018-06-26 DIAGNOSIS — E119 Type 2 diabetes mellitus without complications: Secondary | ICD-10-CM

## 2018-06-26 DIAGNOSIS — B351 Tinea unguium: Secondary | ICD-10-CM | POA: Diagnosis not present

## 2018-06-26 NOTE — Patient Instructions (Signed)

## 2018-06-29 ENCOUNTER — Encounter: Payer: Self-pay | Admitting: Podiatry

## 2018-06-29 NOTE — Progress Notes (Signed)
Subjective: Cynthia Torres is a 73 y.o. y.o. female who presents for preventative diabetic foot care on today with cc of painful, discolored, thick toenails and painful corn left 2nd digit (erroneously documented in the past as right 2nd toe) which both interfere with daily activities. Pain is aggravated when wearing enclosed shoe gear and relieved with periodic professional debridement.  Lavone Orn, MD is her PCP. Last visit was 2 months ago.   Current Outpatient Medications:  .  allopurinol (ZYLOPRIM) 300 MG tablet, , Disp: , Rfl:  .  amLODipine (NORVASC) 5 MG tablet, Take 5 mg by mouth daily., Disp: , Rfl:  .  atenolol (TENORMIN) 100 MG tablet, Take 100 mg by mouth daily., Disp: , Rfl:  .  atorvastatin (LIPITOR) 40 MG tablet, Take 40 mg by mouth daily., Disp: , Rfl:  .  fosinopril (MONOPRIL) 40 MG tablet, Take 40 mg by mouth daily., Disp: , Rfl:  .  HYDROcodone-acetaminophen (NORCO/VICODIN) 5-325 MG tablet, Take 1 tablet by mouth every 6 (six) hours as needed for moderate pain., Disp: 30 tablet, Rfl: 0 .  lisinopril-hydrochlorothiazide (PRINZIDE,ZESTORETIC) 20-25 MG tablet, , Disp: , Rfl:  .  meloxicam (MOBIC) 15 MG tablet, Take 1 tablet (15 mg total) by mouth daily., Disp: 30 tablet, Rfl: 0 .  metFORMIN (GLUCOPHAGE) 500 MG tablet, Take by mouth 2 (two) times daily with a meal., Disp: , Rfl:  .  NONFORMULARY OR COMPOUNDED ITEM, Apply 1-2 g topically 4 (four) times daily., Disp: 120 each, Rfl: 2 .  ONETOUCH VERIO test strip, , Disp: , Rfl:  .  potassium chloride (K-DUR) 10 MEQ tablet, , Disp: , Rfl:   Current Facility-Administered Medications:  .  betamethasone acetate-betamethasone sodium phosphate (CELESTONE) injection 3 mg, 3 mg, Intramuscular, Once, Evans, Brent M, DPM  No Known Allergies  Objective:  Vascular Examination: Capillary refill time <3 seconds x 10 digits.  Dorsalis pedis pulses palpable b/l.  Posterior tibial pulses palpable b/l.  Digital hair absent x 10  digits.  Skin temperature gradient WNL b/l.  Dermatological Examination: Skin with normal turgor, texture and tone b/l.  Toenails 1-5 b/l discolored, thick, dystrophic with subungual debris and pain with palpation to nailbeds due to thickness of nails.  Hyperkeratotic lesion dorsomedial aspect left 2nd digit DIPJ. No erythema, no edema, no drainage, no flocculence noted.   Musculoskeletal: Muscle strength 5/5 to all LE muscle groups.  Hammertoe left 2nd digit.  Neurological: Sensation intact with 10 gram monofilament.  Vibratory sensation intact.  Assessment: 1. Painful onychomycosis toenails 1-5 b/l 2.   Corn left 2nd digit 4.  NIDDM  Plan: 1. Continue diabetic foot care principles. Literature dispensed on today. 2. Toenails 1-5 b/l were debrided in length and girth without iatrogenic bleeding. 3. Hyperkeratotic lesion(s) pared left 2nd digit with sterile scalpel blade without incident. 4. Patient to continue soft, supportive shoe gear daily. 5. Patient to report any pedal injuries to medical professional immediately. 6. Follow up 3 months.  7. Patient/POA to call should there be a concern in the interim.

## 2018-09-25 ENCOUNTER — Encounter: Payer: Self-pay | Admitting: Podiatry

## 2018-09-25 ENCOUNTER — Ambulatory Visit: Payer: Medicare Other | Admitting: Podiatry

## 2018-09-25 ENCOUNTER — Other Ambulatory Visit: Payer: Self-pay

## 2018-09-25 DIAGNOSIS — L84 Corns and callosities: Secondary | ICD-10-CM

## 2018-09-25 DIAGNOSIS — M79676 Pain in unspecified toe(s): Secondary | ICD-10-CM

## 2018-09-25 DIAGNOSIS — E119 Type 2 diabetes mellitus without complications: Secondary | ICD-10-CM | POA: Diagnosis not present

## 2018-09-25 DIAGNOSIS — M79662 Pain in left lower leg: Secondary | ICD-10-CM

## 2018-09-25 DIAGNOSIS — B351 Tinea unguium: Secondary | ICD-10-CM

## 2018-09-25 MED ORDER — HYDROCORTISONE 1 % EX OINT: 1.0000 "application " | TOPICAL_OINTMENT | Freq: Two times a day (BID) | CUTANEOUS | 0 refills | Status: DC

## 2018-09-25 NOTE — Patient Instructions (Signed)

## 2018-09-25 NOTE — Progress Notes (Addendum)
Subjective: "Painful left foot/ankle swelling today." CECELIA GRACIANO is a 73 y.o. y.o. female who presents today for preventative foot care  with cc of painful, discolored, thick toenails and painful corn left 2nd digit which interferes with daily activities. Pain is aggravated when wearing enclosed shoe gear and relieved with periodic professional debridement.  Today, she c/o painful left lower extremity. She does have chronic lower extremity edema, but states   She also c/o pain along top of left foot 1st metatarsal head.  Denies any redness, swelling, drainage or weeping.  Lavone Orn, MD is her PCP.   No Known Allergies  Objective: There were no vitals filed for this visit.  Vascular Examination: Capillary refill time <3 seconds x 10 digits.  Dorsalis pedis pulses palpable b/l.  Posterior tibial pulses palpable b/l..  Digital hair absent x 10 digits.  Skin temperature gradient WNL b/l.  Edema LLE > RLE with tenderness to palpation. +2 pitting LLE. +Tenderness to calf LLE.  Dermatological Examination: Skin with normal turgor, texture and tone b/l.  Toenails 1-5 b/l discolored, thick, dystrophic with subungual debris and pain with palpation to nailbeds due to thickness of nails.  Hyperkeratotic lesion medial aspect left 2nd toe DIPJ. No erythema, no edema, no drainage, no flocculence noted. +POP.  Musculoskeletal: Muscle strength 5/5 to all LE muscle groups b/l.  Hammertoe left 2nd digit.  Neurological: Sensation intact 5/5 b/l  with 10 gram monofilament.  Vibratory sensation intact.  Assessment: 1. Painful onychomycosis toenails 1-5 b/l 2.  Corn left 2nd digit 3.  LLE swelling and pain; r/o DVT 4.  NIDDM  Plan: 1. Continue diabetic foot care principles. Literature dispensed on today. 2. Rx written for hydrocortisone ointment to be applied to area of itching twice daily. Discontinue when symptoms resolve. 3. Discussed possible DVT based on symptoms.  Recommended ER visit and she declined. She opted for Venous Doppler of left LE to be done at [facility]. We will call her with results. 4. Toenails 1-5 b/l were debrided in length and girth without iatrogenic bleeding. 5. Hyperkeratotic lesion pared left 2nd digit with sterile scalpel blade without incident. 6. Patient to continue soft, supportive shoe gear daily. 7. Patient to report any pedal injuries to medical professional immediately. 8. Follow up 3 months.  9. Patient/POA to call should there be a concern in the interim.

## 2018-09-30 ENCOUNTER — Other Ambulatory Visit: Payer: Self-pay

## 2018-09-30 ENCOUNTER — Ambulatory Visit (HOSPITAL_COMMUNITY)
Admission: RE | Admit: 2018-09-30 | Discharge: 2018-09-30 | Disposition: A | Discharge status: Home / Self Care | Payer: Medicare Other | Source: Ambulatory Visit | Attending: Internal Medicine | Admitting: Internal Medicine

## 2018-09-30 DIAGNOSIS — M79662 Pain in left lower leg: Secondary | ICD-10-CM | POA: Insufficient documentation

## 2018-10-05 ENCOUNTER — Other Ambulatory Visit: Payer: Self-pay | Admitting: Internal Medicine

## 2018-10-05 DIAGNOSIS — Z1231 Encounter for screening mammogram for malignant neoplasm of breast: Secondary | ICD-10-CM

## 2018-11-16 ENCOUNTER — Ambulatory Visit
Admission: RE | Admit: 2018-11-16 | Discharge: 2018-11-16 | Disposition: A | Discharge status: Home / Self Care | Payer: Medicare Other | Source: Ambulatory Visit | Attending: Internal Medicine | Admitting: Internal Medicine

## 2018-11-16 ENCOUNTER — Other Ambulatory Visit: Payer: Self-pay

## 2018-11-16 DIAGNOSIS — Z1231 Encounter for screening mammogram for malignant neoplasm of breast: Secondary | ICD-10-CM

## 2019-01-01 ENCOUNTER — Other Ambulatory Visit: Payer: Self-pay

## 2019-01-01 ENCOUNTER — Encounter: Payer: Self-pay | Admitting: Podiatry

## 2019-01-01 ENCOUNTER — Ambulatory Visit: Payer: Medicare Other | Admitting: Podiatry

## 2019-01-01 DIAGNOSIS — B351 Tinea unguium: Secondary | ICD-10-CM | POA: Diagnosis not present

## 2019-01-01 DIAGNOSIS — L84 Corns and callosities: Secondary | ICD-10-CM | POA: Diagnosis not present

## 2019-01-01 DIAGNOSIS — E119 Type 2 diabetes mellitus without complications: Secondary | ICD-10-CM

## 2019-01-01 DIAGNOSIS — M79676 Pain in unspecified toe(s): Secondary | ICD-10-CM | POA: Diagnosis not present

## 2019-01-01 NOTE — Patient Instructions (Signed)
Diabetes Mellitus and Foot Care Foot care is an important part of your health, especially when you have diabetes. Diabetes may cause you to have problems because of poor blood flow (circulation) to your feet and legs, which can cause your skin to:  Become thinner and drier.  Break more easily.  Heal more slowly.  Peel and crack. You may also have nerve damage (neuropathy) in your legs and feet, causing decreased feeling in them. This means that you may not notice minor injuries to your feet that could lead to more serious problems. Noticing and addressing any potential problems early is the best way to prevent future foot problems. How to care for your feet Foot hygiene  Wash your feet daily with warm water and mild soap. Do not use hot water. Then, pat your feet and the areas between your toes until they are completely dry. Do not soak your feet as this can dry your skin.  Trim your toenails straight across. Do not dig under them or around the cuticle. File the edges of your nails with an emery board or nail file.  Apply a moisturizing lotion or petroleum jelly to the skin on your feet and to dry, brittle toenails. Use lotion that does not contain alcohol and is unscented. Do not apply lotion between your toes. Shoes and socks  Wear clean socks or stockings every day. Make sure they are not too tight. Do not wear knee-high stockings since they may decrease blood flow to your legs.  Wear shoes that fit properly and have enough cushioning. Always look in your shoes before you put them on to be sure there are no objects inside.  To break in new shoes, wear them for just a few hours a day. This prevents injuries on your feet. Wounds, scrapes, corns, and calluses  Check your feet daily for blisters, cuts, bruises, sores, and redness. If you cannot see the bottom of your feet, use a mirror or ask someone for help.  Do not cut corns or calluses or try to remove them with medicine.  If you  find a minor scrape, cut, or break in the skin on your feet, keep it and the skin around it clean and dry. You may clean these areas with mild soap and water. Do not clean the area with peroxide, alcohol, or iodine.  If you have a wound, scrape, corn, or callus on your foot, look at it several times a day to make sure it is healing and not infected. Check for: ? Redness, swelling, or pain. ? Fluid or blood. ? Warmth. ? Pus or a bad smell. General instructions  Do not cross your legs. This may decrease blood flow to your feet.  Do not use heating pads or hot water bottles on your feet. They may burn your skin. If you have lost feeling in your feet or legs, you may not know this is happening until it is too late.  Protect your feet from hot and cold by wearing shoes, such as at the beach or on hot pavement.  Schedule a complete foot exam at least once a year (annually) or more often if you have foot problems. If you have foot problems, report any cuts, sores, or bruises to your health care provider immediately. Contact a health care provider if:  You have a medical condition that increases your risk of infection and you have any cuts, sores, or bruises on your feet.  You have an injury that is not   healing.  You have redness on your legs or feet.  You feel burning or tingling in your legs or feet.  You have pain or cramps in your legs and feet.  Your legs or feet are numb.  Your feet always feel cold.  You have pain around a toenail. Get help right away if:  You have a wound, scrape, corn, or callus on your foot and: ? You have pain, swelling, or redness that gets worse. ? You have fluid or blood coming from the wound, scrape, corn, or callus. ? Your wound, scrape, corn, or callus feels warm to the touch. ? You have pus or a bad smell coming from the wound, scrape, corn, or callus. ? You have a fever. ? You have a red line going up your leg. Summary  Check your feet every day  for cuts, sores, red spots, swelling, and blisters.  Moisturize feet and legs daily.  Wear shoes that fit properly and have enough cushioning.  If you have foot problems, report any cuts, sores, or bruises to your health care provider immediately.  Schedule a complete foot exam at least once a year (annually) or more often if you have foot problems. This information is not intended to replace advice given to you by your health care provider. Make sure you discuss any questions you have with your health care provider. Document Released: 02/16/2000 Document Revised: 04/02/2017 Document Reviewed: 03/22/2016 Elsevier Patient Education  2020 [employer] Inc[employer]  

## 2019-01-02 NOTE — Progress Notes (Signed)
Subjective: Cynthia Torres is a 73 y.o. y.o. female who presents today with h/o diabetes with painful corn left 2nd toe and painful, discolored, thick toenails  which interfere with daily activities. Pain is aggravated when wearing enclosed shoe gear and relieved with periodic professional debridement.  Lavone Orn, MD is her PCP.  She relates new lesion on lateral aspect of her right foot today.   Current Outpatient Medications on File Prior to Visit  Medication Sig Dispense Refill  . allopurinol (ZYLOPRIM) 300 MG tablet     . amLODipine (NORVASC) 5 MG tablet Take 5 mg by mouth daily.    Marland Kitchen atenolol (TENORMIN) 100 MG tablet Take 100 mg by mouth daily.    Marland Kitchen atorvastatin (LIPITOR) 40 MG tablet Take 40 mg by mouth daily.    . fosinopril (MONOPRIL) 40 MG tablet Take 40 mg by mouth daily.    Marland Kitchen HYDROcodone-acetaminophen (NORCO/VICODIN) 5-325 MG tablet Take 1 tablet by mouth every 6 (six) hours as needed for moderate pain. 30 tablet 0  . hydrocortisone 1 % ointment Apply 1 application topically 2 (two) times daily. 30 g 0  . lisinopril-hydrochlorothiazide (PRINZIDE,ZESTORETIC) 20-25 MG tablet     . meloxicam (MOBIC) 15 MG tablet Take 1 tablet (15 mg total) by mouth daily. 30 tablet 0  . metFORMIN (GLUCOPHAGE) 500 MG tablet Take by mouth 2 (two) times daily with a meal.    . NONFORMULARY OR COMPOUNDED ITEM Apply 1-2 g topically 4 (four) times daily. 120 each 2  . ONETOUCH VERIO test strip     . potassium chloride (K-DUR) 10 MEQ tablet     . rosuvastatin (CRESTOR) 5 MG tablet      Current Facility-Administered Medications on File Prior to Visit  Medication Dose Route Frequency Provider Last Rate Last Dose  . betamethasone acetate-betamethasone sodium phosphate (CELESTONE) injection 3 mg  3 mg Intramuscular Once Daylene Katayama M, DPM        No Known Allergies  Objective: 73 yo AAF, WD, WN in NAD. AAO x 3.   Vascular Examination: Capillary refill time < 3 seconds b/l.    Dorsalis pedis and  Posterior tibial pulses are palpable b/l.  Digital hair absent b/l.  Skin temperature gradient WNL b/l.  Dermatological Examination: Skin with normal turgor, texture and tone b/l.  Toenails 1-5 b/l discolored, thick, dystrophic with subungual debris and pain with palpation to nailbeds due to thickness of nails.  Hyperkeratotic lesion left 2nd digit and lateral aspect right midfoot. No erythema, no edema, no drainage, no flocculence noted.   Musculoskeletal: Muscle strength 5/5 to all LE muscle groups b/l.  Hammertoe left 2nd digit.  Neurological: Sensation intact 5/5 b/l with 10 gram monofilament.  Assessment: 1. Painful onychomycosis toenails 1-5 b/l 2.  Callus lateral aspect right foot 3.  Corn left 2nd digit 4.  NIDDM  Plan: 1. Continue diabetic foot care principles. Literature dispensed on today. 2. Toenails 1-5 b/l were debrided in length and girth without iatrogenic bleeding. 3. Hyperkeratotic lesions left 2nd digit and lateral aspect right foot pared with sterile scalpel blade without incident. 4. Patient to continue soft, supportive shoe gear daily. 5. Patient to report any pedal injuries to medical professional immediately. 6. Follow up 3 months.  7. Patient/POA to call should there be a concern in the interim.

## 2019-04-09 ENCOUNTER — Ambulatory Visit: Payer: Medicare Other | Admitting: Podiatry

## 2019-04-09 ENCOUNTER — Encounter: Payer: Self-pay | Admitting: Podiatry

## 2019-04-09 ENCOUNTER — Other Ambulatory Visit: Payer: Self-pay

## 2019-04-09 DIAGNOSIS — B351 Tinea unguium: Secondary | ICD-10-CM

## 2019-04-09 DIAGNOSIS — L84 Corns and callosities: Secondary | ICD-10-CM

## 2019-04-09 DIAGNOSIS — E119 Type 2 diabetes mellitus without complications: Secondary | ICD-10-CM | POA: Diagnosis not present

## 2019-04-09 DIAGNOSIS — M79676 Pain in unspecified toe(s): Secondary | ICD-10-CM | POA: Diagnosis not present

## 2019-04-09 NOTE — Progress Notes (Signed)
Subjective: Cynthia Torres presents today for follow up of preventative diabetic foot care and painful mycotic nails b/l.  Pain interferes with ambulation. Aggravating factors include wearing enclosed shoe gear. Pt also presents with painful corn(s) left 2nd digit and callus(es) lateral right foot which interfere with ambulation.   No Known Allergies   Objective: There were no vitals filed for this visit.  Vascular Examination:  Capillary fill time to digits <3s b/l, palpable DP pulses b/l, palpable PT pulses b/l, pedal hair absent b/l and skin temperature gradient within normal limits b/l  Dermatological Examination: Pedal skin with normal turgor, texture and tone bilaterally, no open wounds bilaterally, no interdigital macerations bilaterally, toenails 1-5 b/l elongated, dystrophic, thickened, crumbly with subungual debris and hyperkeratotic lesion(s) dorsomedial 2nd digit left foot and dorsolateral midfoot right foot.  No erythema, no edema, no drainage, no flocculence  Musculoskeletal: Normal muscle strength 5/5 to all lower extremity muscle groups bilaterally, no pain crepitus or joint limitation noted with ROM b/l and hammertoes noted to the L 2nd toe  Neurological: Protective sensation intact 5/5 intact bilaterally with 10g monofilament b/l and vibratory sensation intact b/l  Assessment: 1. Pain due to onychomycosis of toenail   2. Corns and callosities   3. Diabetes mellitus without complication ([place])      Plan: -Continue diabetic foot care principles. Literature dispensed on today.  -Toenails 1-5 b/l were debrided in length and girth without iatrogenic bleeding. -corns and calluses were debrided without complication or incident. Total number debrided =2 left 2nd digit and dorsolateral midfoot right foot -Patient to continue soft, supportive shoe gear daily. -Patient to report any pedal injuries to medical professional immediately. -Patient/POA to call should there be  question/concern in the interim.  Return in about 3 months (around 07/07/2019) for diabetic nail and callus trim.

## 2019-04-09 NOTE — Patient Instructions (Signed)
Diabetes Mellitus and Foot Care Foot care is an important part of your health, especially when you have diabetes. Diabetes may cause you to have problems because of poor blood flow (circulation) to your feet and legs, which can cause your skin to:  Become thinner and drier.  Break more easily.  Heal more slowly.  Peel and crack. You may also have nerve damage (neuropathy) in your legs and feet, causing decreased feeling in them. This means that you may not notice minor injuries to your feet that could lead to more serious problems. Noticing and addressing any potential problems early is the best way to prevent future foot problems. How to care for your feet Foot hygiene  Wash your feet daily with warm water and mild soap. Do not use hot water. Then, pat your feet and the areas between your toes until they are completely dry. Do not soak your feet as this can dry your skin.  Trim your toenails straight across. Do not dig under them or around the cuticle. File the edges of your nails with an emery board or nail file.  Apply a moisturizing lotion or petroleum jelly to the skin on your feet and to dry, brittle toenails. Use lotion that does not contain alcohol and is unscented. Do not apply lotion between your toes. Shoes and socks  Wear clean socks or stockings every day. Make sure they are not too tight. Do not wear knee-high stockings since they may decrease blood flow to your legs.  Wear shoes that fit properly and have enough cushioning. Always look in your shoes before you put them on to be sure there are no objects inside.  To break in new shoes, wear them for just a few hours a day. This prevents injuries on your feet. Wounds, scrapes, corns, and calluses  Check your feet daily for blisters, cuts, bruises, sores, and redness. If you cannot see the bottom of your feet, use a mirror or ask someone for help.  Do not cut corns or calluses or try to remove them with medicine.  If you  find a minor scrape, cut, or break in the skin on your feet, keep it and the skin around it clean and dry. You may clean these areas with mild soap and water. Do not clean the area with peroxide, alcohol, or iodine.  If you have a wound, scrape, corn, or callus on your foot, look at it several times a day to make sure it is healing and not infected. Check for: ? Redness, swelling, or pain. ? Fluid or blood. ? Warmth. ? Pus or a bad smell. General instructions  Do not cross your legs. This may decrease blood flow to your feet.  Do not use heating pads or hot water bottles on your feet. They may burn your skin. If you have lost feeling in your feet or legs, you may not know this is happening until it is too late.  Protect your feet from hot and cold by wearing shoes, such as at the beach or on hot pavement.  Schedule a complete foot exam at least once a year (annually) or more often if you have foot problems. If you have foot problems, report any cuts, sores, or bruises to your health care provider immediately. Contact a health care provider if:  You have a medical condition that increases your risk of infection and you have any cuts, sores, or bruises on your feet.  You have an injury that is not   healing.  You have redness on your legs or feet.  You feel burning or tingling in your legs or feet.  You have pain or cramps in your legs and feet.  Your legs or feet are numb.  Your feet always feel cold.  You have pain around a toenail. Get help right away if:  You have a wound, scrape, corn, or callus on your foot and: ? You have pain, swelling, or redness that gets worse. ? You have fluid or blood coming from the wound, scrape, corn, or callus. ? Your wound, scrape, corn, or callus feels warm to the touch. ? You have pus or a bad smell coming from the wound, scrape, corn, or callus. ? You have a fever. ? You have a red line going up your leg. Summary  Check your feet every day  for cuts, sores, red spots, swelling, and blisters.  Moisturize feet and legs daily.  Wear shoes that fit properly and have enough cushioning.  If you have foot problems, report any cuts, sores, or bruises to your health care provider immediately.  Schedule a complete foot exam at least once a year (annually) or more often if you have foot problems. This information is not intended to replace advice given to you by your health care provider. Make sure you discuss any questions you have with your health care provider. Document Revised: 11/11/2018 Document Reviewed: 03/22/2016 Elsevier Patient Education  2020 Elsevier Inc.  

## 2019-05-09 ENCOUNTER — Ambulatory Visit: Payer: Medicare Other | Attending: Internal Medicine

## 2019-05-09 DIAGNOSIS — Z23 Encounter for immunization: Secondary | ICD-10-CM | POA: Insufficient documentation

## 2019-05-09 NOTE — Progress Notes (Signed)
   Covid-19 Vaccination Clinic  Name:  Cynthia Torres    MRN: [id number] DOB: 1945/09/12  05/09/2019  Ms. [name] was observed post Covid-19 immunization for 15 minutes without incident. She was provided with Vaccine Information Sheet and instruction to access the V-Safe system.   Ms. [name] was instructed to call 911 with any severe reactions post vaccine: Marland Kitchen Difficulty breathing  . Swelling of face and throat  . A fast heartbeat  . A bad rash all over body  . Dizziness and weakness   Immunizations Administered    Name Date Dose VIS Date Route   Pfizer COVID-19 Vaccine 05/09/2019 12:54 PM 0.3 mL 02/12/2019 Intramuscular   Manufacturer: [place]   Lot: [id number]   [place]: [id number]

## 2019-05-21 DIAGNOSIS — M1811 Unilateral primary osteoarthritis of first carpometacarpal joint, right hand: Secondary | ICD-10-CM | POA: Insufficient documentation

## 2019-05-21 DIAGNOSIS — M65331 Trigger finger, right middle finger: Secondary | ICD-10-CM | POA: Insufficient documentation

## 2019-06-08 ENCOUNTER — Ambulatory Visit: Payer: Medicare Other | Attending: Internal Medicine

## 2019-06-08 DIAGNOSIS — Z23 Encounter for immunization: Secondary | ICD-10-CM

## 2019-06-08 NOTE — Progress Notes (Signed)
   Covid-19 Vaccination Clinic  Name:  [name]    MRN: [id number] DOB: 1945/11/29  06/08/2019  [name] was observed post Covid-19 immunization for 15 minutes without incident. She was provided with Vaccine Information Sheet and instruction to access the V-Safe system.   [name] was instructed to call 911 with any severe reactions post vaccine: Marland Kitchen Difficulty breathing  . Swelling of face and throat  . A fast heartbeat  . A bad rash all over body  . Dizziness and weakness   Immunizations Administered    Name Date Dose VIS Date Route   Pfizer COVID-19 Vaccine 06/08/2019  3:22 PM 0.3 mL 02/12/2019 Intramuscular   Manufacturer: [place]   Lot: [id number]   [place]: [id number]

## 2019-07-09 ENCOUNTER — Ambulatory Visit: Payer: Medicare Other | Admitting: Podiatry

## 2019-07-09 ENCOUNTER — Other Ambulatory Visit: Payer: Self-pay

## 2019-07-09 ENCOUNTER — Encounter: Payer: Self-pay | Admitting: Podiatry

## 2019-07-09 VITALS — Temp 98.2°F

## 2019-07-09 DIAGNOSIS — M79676 Pain in unspecified toe(s): Secondary | ICD-10-CM | POA: Diagnosis not present

## 2019-07-09 DIAGNOSIS — E119 Type 2 diabetes mellitus without complications: Secondary | ICD-10-CM

## 2019-07-09 DIAGNOSIS — L84 Corns and callosities: Secondary | ICD-10-CM | POA: Diagnosis not present

## 2019-07-09 DIAGNOSIS — B351 Tinea unguium: Secondary | ICD-10-CM

## 2019-07-09 NOTE — Patient Instructions (Signed)
Diabetes Mellitus and Foot Care Foot care is an important part of your health, especially when you have diabetes. Diabetes may cause you to have problems because of poor blood flow (circulation) to your feet and legs, which can cause your skin to:  Become thinner and drier.  Break more easily.  Heal more slowly.  Peel and crack. You may also have nerve damage (neuropathy) in your legs and feet, causing decreased feeling in them. This means that you may not notice minor injuries to your feet that could lead to more serious problems. Noticing and addressing any potential problems early is the best way to prevent future foot problems. How to care for your feet Foot hygiene  Wash your feet daily with warm water and mild soap. Do not use hot water. Then, pat your feet and the areas between your toes until they are completely dry. Do not soak your feet as this can dry your skin.  Trim your toenails straight across. Do not dig under them or around the cuticle. File the edges of your nails with an emery board or nail file.  Apply a moisturizing lotion or petroleum jelly to the skin on your feet and to dry, brittle toenails. Use lotion that does not contain alcohol and is unscented. Do not apply lotion between your toes. Shoes and socks  Wear clean socks or stockings every day. Make sure they are not too tight. Do not wear knee-high stockings since they may decrease blood flow to your legs.  Wear shoes that fit properly and have enough cushioning. Always look in your shoes before you put them on to be sure there are no objects inside.  To break in new shoes, wear them for just a few hours a day. This prevents injuries on your feet. Wounds, scrapes, corns, and calluses  Check your feet daily for blisters, cuts, bruises, sores, and redness. If you cannot see the bottom of your feet, use a mirror or ask someone for help.  Do not cut corns or calluses or try to remove them with medicine.  If you  find a minor scrape, cut, or break in the skin on your feet, keep it and the skin around it clean and dry. You may clean these areas with mild soap and water. Do not clean the area with peroxide, alcohol, or iodine.  If you have a wound, scrape, corn, or callus on your foot, look at it several times a day to make sure it is healing and not infected. Check for: ? Redness, swelling, or pain. ? Fluid or blood. ? Warmth. ? Pus or a bad smell. General instructions  Do not cross your legs. This may decrease blood flow to your feet.  Do not use heating pads or hot water bottles on your feet. They may burn your skin. If you have lost feeling in your feet or legs, you may not know this is happening until it is too late.  Protect your feet from hot and cold by wearing shoes, such as at the beach or on hot pavement.  Schedule a complete foot exam at least once a year (annually) or more often if you have foot problems. If you have foot problems, report any cuts, sores, or bruises to your health care provider immediately. Contact a health care provider if:  You have a medical condition that increases your risk of infection and you have any cuts, sores, or bruises on your feet.  You have an injury that is not   healing.  You have redness on your legs or feet.  You feel burning or tingling in your legs or feet.  You have pain or cramps in your legs and feet.  Your legs or feet are numb.  Your feet always feel cold.  You have pain around a toenail. Get help right away if:  You have a wound, scrape, corn, or callus on your foot and: ? You have pain, swelling, or redness that gets worse. ? You have fluid or blood coming from the wound, scrape, corn, or callus. ? Your wound, scrape, corn, or callus feels warm to the touch. ? You have pus or a bad smell coming from the wound, scrape, corn, or callus. ? You have a fever. ? You have a red line going up your leg. Summary  Check your feet every day  for cuts, sores, red spots, swelling, and blisters.  Moisturize feet and legs daily.  Wear shoes that fit properly and have enough cushioning.  If you have foot problems, report any cuts, sores, or bruises to your health care provider immediately.  Schedule a complete foot exam at least once a year (annually) or more often if you have foot problems. This information is not intended to replace advice given to you by your health care provider. Make sure you discuss any questions you have with your health care provider. Document Revised: 11/11/2018 Document Reviewed: 03/22/2016 Elsevier Patient Education  2020 Elsevier Inc.  Corns and Calluses Corns are small areas of thickened skin that occur on the top, sides, or tip of a toe. They contain a cone-shaped core with a point that can press on a nerve below. This causes pain.  Calluses are areas of thickened skin that can occur anywhere on the body, including the hands, fingers, palms, soles of the feet, and heels. Calluses are usually larger than corns. What are the causes? Corns and calluses are caused by rubbing (friction) or pressure, such as from shoes that are too tight or do not fit properly. What increases the risk? Corns are more likely to develop in people who have misshapen toes (toe deformities), such as hammer toes. Calluses can occur with friction to any area of the skin. They are more likely to develop in people who:  Work with their hands.  Wear shoes that fit poorly, are too tight, or are high-heeled.  Have toe deformities. What are the signs or symptoms? Symptoms of a corn or callus include:  A hard growth on the skin.  Pain or tenderness under the skin.  Redness and swelling.  Increased discomfort while wearing tight-fitting shoes, if your feet are affected. If a corn or callus becomes infected, symptoms may include:  Redness and swelling that gets worse.  Pain.  Fluid, blood, or pus draining from the corn or  callus. How is this diagnosed? Corns and calluses may be diagnosed based on your symptoms, your medical history, and a physical exam. How is this treated? Treatment for corns and calluses may include:  Removing the cause of the friction or pressure. This may involve: ? Changing your shoes. ? Wearing shoe inserts (orthotics) or other protective layers in your shoes, such as a corn pad. ? Wearing gloves.  Applying medicine to the skin (topical medicine) to help soften skin in the hardened, thickened areas.  Removing layers of dead skin with a file to reduce the size of the corn or callus.  Removing the corn or callus with a scalpel or laser.  Taking   antibiotic medicines, if your corn or callus is infected.  Having surgery, if a toe deformity is the cause. Follow these instructions at home:   Take over-the-counter and prescription medicines only as told by your health care provider.  If you were prescribed an antibiotic, take it as told by your health care provider. Do not stop taking it even if your condition starts to improve.  Wear shoes that fit well. Avoid wearing high-heeled shoes and shoes that are too tight or too loose.  Wear any padding, protective layers, gloves, or orthotics as told by your health care provider.  Soak your hands or feet and then use a file or pumice stone to soften your corn or callus. Do this as told by your health care provider.  Check your corn or callus every day for symptoms of infection. Contact a health care provider if you:  Notice that your symptoms do not improve with treatment.  Have redness or swelling that gets worse.  Notice that your corn or callus becomes painful.  Have fluid, blood, or pus coming from your corn or callus.  Have new symptoms. Summary  Corns are small areas of thickened skin that occur on the top, sides, or tip of a toe.  Calluses are areas of thickened skin that can occur anywhere on the body, including the  hands, fingers, palms, and soles of the feet. Calluses are usually larger than corns.  Corns and calluses are caused by rubbing (friction) or pressure, such as from shoes that are too tight or do not fit properly.  Treatment may include wearing any padding, protective layers, gloves, or orthotics as told by your health care provider. This information is not intended to replace advice given to you by your health care provider. Make sure you discuss any questions you have with your health care provider. Document Revised: 06/10/2018 Document Reviewed: 01/01/2017 Elsevier Patient Education  2020 Elsevier Inc.  

## 2019-07-09 NOTE — Progress Notes (Signed)
Subjective: Cynthia Torres presents today for follow up of preventative diabetic foot care and corn(s) left 2nd toe and callus(es) lateral right foot and painful mycotic nails b/l.  Pain interferes with ambulation. Aggravating factors include wearing enclosed shoe gear.   She voices no new pedal concerns on today's visit.  No Known Allergies   Objective: Vitals:   07/09/19 0837  Temp: 98.2 F (36.8 C)    Pt is a pleasant 74 y.o. year old AA female  in NAD. AAO x 3.   Vascular Examination:  Capillary fill time to digits <3 seconds b/l. Palpable DP pulses b/l. Palpable PT pulses b/l. Pedal hair absent b/l Skin temperature gradient within normal limits b/l. No edema noted b/l.  Dermatological Examination: Pedal skin with normal turgor, texture and tone bilaterally. No open wounds bilaterally. No interdigital macerations bilaterally. Toenails 1-5 b/l elongated, dystrophic, thickened, crumbly with subungual debris and tenderness to dorsal palpation. Hyperkeratotic lesion(s) lateral midfoot right foot and L 2nd toe.  No erythema, no edema, no drainage, no flocculence.  Musculoskeletal: Normal muscle strength 5/5 to all lower extremity muscle groups bilaterally. No pain crepitus or joint limitation noted with ROM b/l. Hammertoes noted to the L 2nd toe.  Neurological: Protective sensation intact 5/5 intact bilaterally with 10g monofilament b/l. Vibratory sensation intact b/l. Proprioception intact bilaterally.  Assessment: 1. Pain due to onychomycosis of toenail   2. Corns and callosities   3. Diabetes mellitus without complication ([place])    Plan: -No new findings. No new orders. -Continue diabetic foot care principles. Literature dispensed on today.  -Toenails 1-5 b/l were debrided in length and girth with sterile nail nippers and dremel without iatrogenic bleeding.  -Corn(s) L 2nd toe and callus(es) lateral midfoot right foot were pared utilizing sterile scalpel blade without  complication or incident. Total number debrided 2. -Patient to continue soft, supportive shoe gear daily. -Patient to report any pedal injuries to medical professional immediately. -Patient/POA to call should there be question/concern in the interim.  Return in about 3 months (around 10/09/2019) for diabetic nail and callus trim.  Marzetta Board, DPM

## 2019-09-03 ENCOUNTER — Other Ambulatory Visit: Payer: Self-pay

## 2019-09-03 ENCOUNTER — Encounter: Payer: Self-pay | Admitting: Plastic Surgery

## 2019-09-03 ENCOUNTER — Ambulatory Visit (INDEPENDENT_AMBULATORY_CARE_PROVIDER_SITE_OTHER): Payer: Medicare Other | Admitting: Plastic Surgery

## 2019-09-03 DIAGNOSIS — H02403 Unspecified ptosis of bilateral eyelids: Secondary | ICD-10-CM

## 2019-09-03 NOTE — Progress Notes (Signed)
Patient ID: Cynthia Torres, female    DOB: December 10, 1945, 74 y.o.   MRN: [id number]   Chief Complaint  Patient presents with  . Advice Only    The patient is a very sweet 74 year old female here for evaluation of her eyelids.  Over the past few months she has noticed difficulty with her visual fields.  The right side is much worse than the left.  She has now started holding her head high and tilting it so that she can see it.  She is not able to drive at night anymore because the lid feels so heavy she cannot raise it high enough.  She has had ocular issues in the past.  She was operated on by Dr. Annamaria Boots about 15 years ago.  She had cataract surgery 3 months ago Dr. Satira Sark.  She weighs 198 pounds.  She has diabetes, obesity, hypertension, gout and irritable bowel syndrome.  She is on medications for each of these medication list.  She does not recall having any visual field exam anytime in the recent past.   Review of Systems  Constitutional: Negative.   HENT: Negative.   Eyes: Negative.   Respiratory: Negative.   Cardiovascular: Positive for leg swelling. Negative for chest pain.  Gastrointestinal: Negative.   Endocrine: Negative.   Genitourinary: Negative.   Skin: Negative.   Neurological: Negative.   Hematological: Negative.     Past Medical History:  Diagnosis Date  . Diabetes mellitus without complication ([place])   . Gout   . HTN (hypertension)   . Hyperlipidemia     History reviewed. No pertinent surgical history.    Current Outpatient Medications:  .  allopurinol (ZYLOPRIM) 300 MG tablet, , Disp: , Rfl:  .  amLODipine (NORVASC) 5 MG tablet, Take 5 mg by mouth daily., Disp: , Rfl:  .  atenolol (TENORMIN) 100 MG tablet, Take 100 mg by mouth daily., Disp: , Rfl:  .  atorvastatin (LIPITOR) 40 MG tablet, Take 40 mg by mouth daily., Disp: , Rfl:  .  fosinopril (MONOPRIL) 40 MG tablet, Take 40 mg by mouth daily., Disp: , Rfl:  .  HYDROcodone-acetaminophen (NORCO/VICODIN) 5-325  MG tablet, Take 1 tablet by mouth every 6 (six) hours as needed for moderate pain., Disp: 30 tablet, Rfl: 0 .  hydrocortisone 1 % ointment, Apply 1 application topically 2 (two) times daily., Disp: 30 g, Rfl: 0 .  lisinopril-hydrochlorothiazide (PRINZIDE,ZESTORETIC) 20-25 MG tablet, , Disp: , Rfl:  .  MEDROL 4 MG TBPK tablet, Take as directed on pack over 6 days, Disp: , Rfl:  .  meloxicam (MOBIC) 15 MG tablet, Take 1 tablet (15 mg total) by mouth daily., Disp: 30 tablet, Rfl: 0 .  metFORMIN (GLUCOPHAGE) 500 MG tablet, Take by mouth 2 (two) times daily with a meal., Disp: , Rfl:  .  methylPREDNISolone acetate (DEPO-MEDROL) 40 MG/ML injection, Inject 80 mg into the muscle once., Disp: , Rfl:  .  moxifloxacin (VIGAMOX) 0.5 % ophthalmic solution, Place 1 drop into the right eye 4 (four) times daily., Disp: , Rfl:  .  NONFORMULARY OR COMPOUNDED ITEM, Apply 1-2 g topically 4 (four) times daily., Disp: 120 each, Rfl: 2 .  ONETOUCH VERIO test strip, , Disp: , Rfl:  .  potassium chloride (K-DUR) 10 MEQ tablet, , Disp: , Rfl:  .  prednisoLONE acetate (PRED FORTE) 1 % ophthalmic suspension, , Disp: , Rfl:  .  PROLENSA 0.07 % SOLN, Place 1 drop into the right eye daily.,  Disp: , Rfl:  .  rosuvastatin (CRESTOR) 5 MG tablet, , Disp: , Rfl:   Current Facility-Administered Medications:  .  betamethasone acetate-betamethasone sodium phosphate (CELESTONE) injection 3 mg, 3 mg, Intramuscular, Once, Daylene Katayama M, DPM   Objective:   Vitals:   09/03/19 0809  BP: (!) 150/83  Pulse: 95  Temp: 97.8 F (36.6 C)  SpO2: 99%    Physical Exam Vitals and nursing note reviewed.  HENT:     Head: Normocephalic and atraumatic.  Cardiovascular:     Rate and Rhythm: Normal rate.     Pulses: Normal pulses.  Pulmonary:     Effort: Pulmonary effort is normal.  Abdominal:     General: Abdomen is flat. There is no distension.  Musculoskeletal:        General: Swelling present. No tenderness.     Left lower leg:  Edema present.  Skin:    General: Skin is warm.     Capillary Refill: Capillary refill takes less than 2 seconds.  Neurological:     General: No focal deficit present.     Mental Status: She is alert and oriented to person, place, and time.  Psychiatric:        Mood and Affect: Mood normal.        Behavior: Behavior normal.     Assessment & Plan:  Acquired ptosis of eyelid of both eyes  Due to the severity of her ptosis and asymmetry I am going to refer her to Dr. Isidoro Donning for specialist care.  I have written for a visual field exam.  I instructed the patient to take this with her when she sees Dr. Lorina Rabon.  She was very happy with this plan and agreed to let me know how things go.  Pictures were obtained of the patient and placed in the chart with the patient's or guardian's permission.  [place], DO

## 2019-10-11 ENCOUNTER — Other Ambulatory Visit: Payer: Self-pay | Admitting: Internal Medicine

## 2019-10-11 DIAGNOSIS — Z1231 Encounter for screening mammogram for malignant neoplasm of breast: Secondary | ICD-10-CM

## 2019-10-12 ENCOUNTER — Telehealth: Payer: Self-pay | Admitting: Plastic Surgery

## 2019-10-12 NOTE — Telephone Encounter (Signed)
I received a note from Dr. Marygrace Drought regarding the patient's visit.  She was seen by me for ptosis.  I referred her for further evaluation of the right eye ptosis and exotropia.  He was concerned and ordered an MRI of the brain and orbits.  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  She was then sent to Dr. Sanda Klein at [facility] as he is a neuro-ophthalmologist.  She then saw Dr. Clovis Riley who is a neuro oncologist and was going to be sent to a neurosurgeon.  It is not clear if she has followed up or not.  I called to check on her and left a message.

## 2019-10-15 ENCOUNTER — Other Ambulatory Visit: Payer: Self-pay

## 2019-10-15 ENCOUNTER — Ambulatory Visit (INDEPENDENT_AMBULATORY_CARE_PROVIDER_SITE_OTHER): Payer: Medicare Other

## 2019-10-15 ENCOUNTER — Encounter: Payer: Self-pay | Admitting: Podiatry

## 2019-10-15 ENCOUNTER — Ambulatory Visit: Payer: Medicare Other | Admitting: Podiatry

## 2019-10-15 DIAGNOSIS — B351 Tinea unguium: Secondary | ICD-10-CM

## 2019-10-15 DIAGNOSIS — M7989 Other specified soft tissue disorders: Secondary | ICD-10-CM

## 2019-10-15 DIAGNOSIS — L84 Corns and callosities: Secondary | ICD-10-CM

## 2019-10-15 DIAGNOSIS — M2041 Other hammer toe(s) (acquired), right foot: Secondary | ICD-10-CM

## 2019-10-15 DIAGNOSIS — E119 Type 2 diabetes mellitus without complications: Secondary | ICD-10-CM

## 2019-10-15 DIAGNOSIS — M79676 Pain in unspecified toe(s): Secondary | ICD-10-CM

## 2019-10-15 DIAGNOSIS — M2042 Other hammer toe(s) (acquired), left foot: Secondary | ICD-10-CM

## 2019-10-15 NOTE — Patient Instructions (Signed)
Diabetes Mellitus and Foot Care Foot care is an important part of your health, especially when you have diabetes. Diabetes may cause you to have problems because of poor blood flow (circulation) to your feet and legs, which can cause your skin to:  Become thinner and drier.  Break more easily.  Heal more slowly.  Peel and crack. You may also have nerve damage (neuropathy) in your legs and feet, causing decreased feeling in them. This means that you may not notice minor injuries to your feet that could lead to more serious problems. Noticing and addressing any potential problems early is the best way to prevent future foot problems. How to care for your feet Foot hygiene  Wash your feet daily with warm water and mild soap. Do not use hot water. Then, pat your feet and the areas between your toes until they are completely dry. Do not soak your feet as this can dry your skin.  Trim your toenails straight across. Do not dig under them or around the cuticle. File the edges of your nails with an emery board or nail file.  Apply a moisturizing lotion or petroleum jelly to the skin on your feet and to dry, brittle toenails. Use lotion that does not contain alcohol and is unscented. Do not apply lotion between your toes. Shoes and socks  Wear clean socks or stockings every day. Make sure they are not too tight. Do not wear knee-high stockings since they may decrease blood flow to your legs.  Wear shoes that fit properly and have enough cushioning. Always look in your shoes before you put them on to be sure there are no objects inside.  To break in new shoes, wear them for just a few hours a day. This prevents injuries on your feet. Wounds, scrapes, corns, and calluses  Check your feet daily for blisters, cuts, bruises, sores, and redness. If you cannot see the bottom of your feet, use a mirror or ask someone for help.  Do not cut corns or calluses or try to remove them with medicine.  If you  find a minor scrape, cut, or break in the skin on your feet, keep it and the skin around it clean and dry. You may clean these areas with mild soap and water. Do not clean the area with peroxide, alcohol, or iodine.  If you have a wound, scrape, corn, or callus on your foot, look at it several times a day to make sure it is healing and not infected. Check for: ? Redness, swelling, or pain. ? Fluid or blood. ? Warmth. ? Pus or a bad smell. General instructions  Do not cross your legs. This may decrease blood flow to your feet.  Do not use heating pads or hot water bottles on your feet. They may burn your skin. If you have lost feeling in your feet or legs, you may not know this is happening until it is too late.  Protect your feet from hot and cold by wearing shoes, such as at the beach or on hot pavement.  Schedule a complete foot exam at least once a year (annually) or more often if you have foot problems. If you have foot problems, report any cuts, sores, or bruises to your health care provider immediately. Contact a health care provider if:  You have a medical condition that increases your risk of infection and you have any cuts, sores, or bruises on your feet.  You have an injury that is not   healing.  You have redness on your legs or feet.  You feel burning or tingling in your legs or feet.  You have pain or cramps in your legs and feet.  Your legs or feet are numb.  Your feet always feel cold.  You have pain around a toenail. Get help right away if:  You have a wound, scrape, corn, or callus on your foot and: ? You have pain, swelling, or redness that gets worse. ? You have fluid or blood coming from the wound, scrape, corn, or callus. ? Your wound, scrape, corn, or callus feels warm to the touch. ? You have pus or a bad smell coming from the wound, scrape, corn, or callus. ? You have a fever. ? You have a red line going up your leg. Summary  Check your feet every day  for cuts, sores, red spots, swelling, and blisters.  Moisturize feet and legs daily.  Wear shoes that fit properly and have enough cushioning.  If you have foot problems, report any cuts, sores, or bruises to your health care provider immediately.  Schedule a complete foot exam at least once a year (annually) or more often if you have foot problems. This information is not intended to replace advice given to you by your health care provider. Make sure you discuss any questions you have with your health care provider. Document Revised: 11/11/2018 Document Reviewed: 03/22/2016 Elsevier Patient Education  2020 Elsevier Inc.  

## 2019-10-16 NOTE — Progress Notes (Signed)
Subjective: Cynthia Torres presents today for follow up of preventative diabetic foot care and corn(s) left 2nd toe and callus(es) lateral right foot and painful mycotic nails b/l.  Pain interferes with ambulation. Aggravating factors include wearing enclosed shoe gear.   She is concerned about lesion on left 2nd toe. It has become more painful. Denies any redness, drainage or swelling.  No Known Allergies   Objective: There were no vitals filed for this visit.  Pt is a pleasant 74 y.o. year old AA female  in NAD. AAO x 3.   Vascular Examination:  Capillary fill time to digits <3 seconds b/l. Palpable DP pulses b/l. Palpable PT pulses b/l. Pedal hair absent b/l Skin temperature gradient within normal limits b/l. No edema noted b/l.  Dermatological Examination: Pedal skin with normal turgor, texture and tone bilaterally. No open wounds bilaterally. No interdigital macerations bilaterally. Toenails 1-5 b/l elongated, discolored, dystrophic, thickened, crumbly with subungual debris and tenderness to dorsal palpation. Hyperkeratotic lesion(s) L 2nd toe. Firmness palpated at base.  No erythema, no edema, no drainage, no fluctuance.  Musculoskeletal: Normal muscle strength 5/5 to all lower extremity muscle groups bilaterally. No pain crepitus or joint limitation noted with ROM b/l. Hammertoes noted to the L 2nd toe.  Neurological: Protective sensation intact 5/5 intact bilaterally with 10g monofilament b/l. Vibratory sensation intact b/l. Proprioception intact bilaterally.   Xray left foot: Normal mineralization No extension of soft tissue into joint of left 2nd DIPJ Left 2nd distal and middle phalanges intact.  Assessment: 1. Mass of soft tissue of foot   2. Pain due to onychomycosis of toenail   3. Diabetes mellitus without complication ([place])   4. Encounter for diabetic foot exam ([place])   5. Corns    Plan: -No new findings. No new orders. -Continue diabetic foot care principles.  Literature dispensed on today.  -Toenails 1-5 b/l were debrided in length and girth with sterile nail nippers and dremel without iatrogenic bleeding.  -Corn(s) L 2nd toe pared utilizing sterile scalpel blade without complication or incident. Total number debrided=1. -3 views, left foot, xray taken and reviewed with patient in office. We discussed referral to one of our surgeons and  biopsy of lesion. She would like to defer due to currently being worked up by opththamology for her right eye.  -Patient to continue soft, supportive shoe gear daily. -Patient to report any pedal injuries to medical professional immediately. -Patient/POA to call should there be question/concern in the interim.  Return in about 3 months (around 01/15/2020) for diabetic nail and callus trim.  Marzetta Board, DPM

## 2019-11-15 ENCOUNTER — Other Ambulatory Visit: Payer: Self-pay

## 2019-11-15 ENCOUNTER — Other Ambulatory Visit: Payer: Self-pay | Admitting: Internal Medicine

## 2019-11-15 DIAGNOSIS — M85839 Other specified disorders of bone density and structure, unspecified forearm: Secondary | ICD-10-CM

## 2019-11-15 DIAGNOSIS — E210 Primary hyperparathyroidism: Secondary | ICD-10-CM

## 2019-11-17 ENCOUNTER — Other Ambulatory Visit: Payer: Self-pay

## 2019-11-17 ENCOUNTER — Ambulatory Visit
Admission: RE | Admit: 2019-11-17 | Discharge: 2019-11-17 | Disposition: A | Discharge status: Home / Self Care | Payer: Medicare Other | Source: Ambulatory Visit | Attending: Internal Medicine | Admitting: Internal Medicine

## 2019-11-17 DIAGNOSIS — Z1231 Encounter for screening mammogram for malignant neoplasm of breast: Secondary | ICD-10-CM

## 2019-12-01 ENCOUNTER — Other Ambulatory Visit: Payer: Medicare Other

## 2020-01-21 ENCOUNTER — Other Ambulatory Visit: Payer: Self-pay

## 2020-01-21 ENCOUNTER — Ambulatory Visit: Payer: Medicare Other | Admitting: Podiatry

## 2020-01-21 ENCOUNTER — Encounter: Payer: Self-pay | Admitting: Podiatry

## 2020-01-21 DIAGNOSIS — B351 Tinea unguium: Secondary | ICD-10-CM | POA: Diagnosis not present

## 2020-01-21 DIAGNOSIS — L84 Corns and callosities: Secondary | ICD-10-CM

## 2020-01-21 DIAGNOSIS — E119 Type 2 diabetes mellitus without complications: Secondary | ICD-10-CM

## 2020-01-21 DIAGNOSIS — M2041 Other hammer toe(s) (acquired), right foot: Secondary | ICD-10-CM

## 2020-01-21 DIAGNOSIS — M79676 Pain in unspecified toe(s): Secondary | ICD-10-CM | POA: Diagnosis not present

## 2020-01-21 DIAGNOSIS — M2042 Other hammer toe(s) (acquired), left foot: Secondary | ICD-10-CM

## 2020-01-23 NOTE — Progress Notes (Signed)
Subjective: Cynthia Torres presents today for follow up of preventative diabetic foot care and corn(s) left 2nd toe and callus(es) lateral right foot and painful mycotic nails b/l.  Pain interferes with ambulation. Aggravating factors include wearing enclosed shoe gear.   She is concerned about lesion on left 2nd toe. It has become more painful. Denies any redness, drainage or swelling.  No Known Allergies   Objective: There were no vitals filed for this visit.  Pt is a pleasant 74 y.o. year old AA female  in NAD. AAO x 3.   Vascular Examination:  Capillary fill time to digits <3 seconds b/l. Palpable DP pulses b/l. Palpable PT pulses b/l. Pedal hair absent b/l Skin temperature gradient within normal limits b/l. No edema noted b/l.  Dermatological Examination: Pedal skin with normal turgor, texture and tone bilaterally. No open wounds bilaterally. No interdigital macerations bilaterally. Toenails 1-5 b/l elongated, discolored, dystrophic, thickened, crumbly with subungual debris and tenderness to dorsal palpation. Hyperkeratotic lesion(s) L 2nd toe. Firmness palpated at base.  No erythema, no edema, no drainage, no fluctuance.  Musculoskeletal: Normal muscle strength 5/5 to all lower extremity muscle groups bilaterally. No pain crepitus or joint limitation noted with ROM b/l. Hammertoes noted to the L 2nd toe.  Neurological: Protective sensation intact 5/5 intact bilaterally with 10g monofilament b/l. Vibratory sensation intact b/l. Proprioception intact bilaterally.   Assessment: 1. Pain due to onychomycosis of toenail   2. Corns   3. Acquired hammertoes of both feet   4. Diabetes mellitus without complication ([place])    Plan: -No new findings. No new orders. -Continue diabetic foot care principles. Literature dispensed on today.  -Toenails 1-5 b/l were debrided in length and girth with sterile nail nippers and dremel without iatrogenic bleeding.  -Corn(s) L 2nd toe pared utilizing  sterile scalpel blade. Total number debrided=1.Light bleeding addressed with Lumicaine hemostatic solution. TAO and band-aid applied. She may remove band-aid tomorrow. No further treatment required. -Patient to continue soft, supportive shoe gear daily. -Patient to report any pedal injuries to medical professional immediately. -Patient/POA to call should there be question/concern in the interim.  Return in about 3 months (around 04/22/2020) for toenail debridement w/corn(s)/callus(es).  Marzetta Board, DPM

## 2020-02-02 ENCOUNTER — Telehealth: Payer: Self-pay

## 2020-02-02 NOTE — Telephone Encounter (Signed)
Call to pt per Dr. Eusebio Friendly request- no answer/left voicemail requesting pt call back.

## 2020-02-21 ENCOUNTER — Telehealth: Payer: Self-pay

## 2020-02-21 NOTE — Telephone Encounter (Signed)
Call to pt- no answer- left v/m requesting call back regarding her visual/MRI

## 2020-02-22 ENCOUNTER — Telehealth: Payer: Self-pay | Admitting: Plastic Surgery

## 2020-02-22 NOTE — Telephone Encounter (Signed)
Patient to call Myrtie Cruise, the nurse navigator at [facility] [phone].  Patient aware and agreed to call for an appointment and MRI (per my conversation with Dr. Clovis Riley today).

## 2020-02-22 NOTE — Telephone Encounter (Signed)
I was able to talk with the patient this morning I called [phone] home number.  I received a note from Dr. Satira Sark regarding the patient's visit from July.  She was seen by Dr. Satira Sark also in October 2019 and had limited extraocular motility of the right eye, ptosis and exotropia.  An MRI of the brain and orbits were ordered.  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  She was sent to see one of the neuro-ophthalmologist, Dr. Sanda Klein at [facility] and then a neuro oncologist Dr. Clovis Riley.  She was referred to a neurosurgeon but did not go.  The patient states that she is scheduled for eyelid surgery with Dr. Linton Rump office.  I will see if I can find out some more information about the neurosurgery consult.

## 2020-02-28 ENCOUNTER — Other Ambulatory Visit: Payer: Self-pay

## 2020-02-28 ENCOUNTER — Ambulatory Visit
Admission: RE | Admit: 2020-02-28 | Discharge: 2020-02-28 | Disposition: A | Discharge status: Home / Self Care | Payer: Medicare Other | Source: Ambulatory Visit | Attending: Internal Medicine | Admitting: Internal Medicine

## 2020-02-28 DIAGNOSIS — E210 Primary hyperparathyroidism: Secondary | ICD-10-CM

## 2020-02-28 DIAGNOSIS — M85839 Other specified disorders of bone density and structure, unspecified forearm: Secondary | ICD-10-CM

## 2020-03-21 DIAGNOSIS — R9089 Other abnormal findings on diagnostic imaging of central nervous system: Secondary | ICD-10-CM | POA: Diagnosis not present

## 2020-03-21 DIAGNOSIS — D164 Benign neoplasm of bones of skull and face: Secondary | ICD-10-CM | POA: Diagnosis not present

## 2020-04-14 DIAGNOSIS — G529 Cranial nerve disorder, unspecified: Secondary | ICD-10-CM | POA: Diagnosis not present

## 2020-05-01 DIAGNOSIS — E210 Primary hyperparathyroidism: Secondary | ICD-10-CM | POA: Diagnosis not present

## 2020-05-01 DIAGNOSIS — K219 Gastro-esophageal reflux disease without esophagitis: Secondary | ICD-10-CM | POA: Diagnosis not present

## 2020-05-01 DIAGNOSIS — E7800 Pure hypercholesterolemia, unspecified: Secondary | ICD-10-CM | POA: Diagnosis not present

## 2020-05-01 DIAGNOSIS — E1165 Type 2 diabetes mellitus with hyperglycemia: Secondary | ICD-10-CM | POA: Diagnosis not present

## 2020-05-01 DIAGNOSIS — Z1389 Encounter for screening for other disorder: Secondary | ICD-10-CM | POA: Diagnosis not present

## 2020-05-01 DIAGNOSIS — E1169 Type 2 diabetes mellitus with other specified complication: Secondary | ICD-10-CM | POA: Diagnosis not present

## 2020-05-01 DIAGNOSIS — M159 Polyosteoarthritis, unspecified: Secondary | ICD-10-CM | POA: Diagnosis not present

## 2020-05-01 DIAGNOSIS — Z0000 Encounter for general adult medical examination without abnormal findings: Secondary | ICD-10-CM | POA: Diagnosis not present

## 2020-05-01 DIAGNOSIS — M109 Gout, unspecified: Secondary | ICD-10-CM | POA: Diagnosis not present

## 2020-05-01 DIAGNOSIS — I10 Essential (primary) hypertension: Secondary | ICD-10-CM | POA: Diagnosis not present

## 2020-05-05 ENCOUNTER — Other Ambulatory Visit: Payer: Self-pay

## 2020-05-05 ENCOUNTER — Ambulatory Visit: Payer: Medicare Other | Admitting: Podiatry

## 2020-05-05 ENCOUNTER — Encounter: Payer: Self-pay | Admitting: Podiatry

## 2020-05-05 DIAGNOSIS — M7989 Other specified soft tissue disorders: Secondary | ICD-10-CM

## 2020-05-05 DIAGNOSIS — M5126 Other intervertebral disc displacement, lumbar region: Secondary | ICD-10-CM | POA: Insufficient documentation

## 2020-05-05 DIAGNOSIS — M79676 Pain in unspecified toe(s): Secondary | ICD-10-CM | POA: Diagnosis not present

## 2020-05-05 DIAGNOSIS — E119 Type 2 diabetes mellitus without complications: Secondary | ICD-10-CM | POA: Diagnosis not present

## 2020-05-05 DIAGNOSIS — L84 Corns and callosities: Secondary | ICD-10-CM

## 2020-05-05 DIAGNOSIS — M2042 Other hammer toe(s) (acquired), left foot: Secondary | ICD-10-CM

## 2020-05-05 DIAGNOSIS — B351 Tinea unguium: Secondary | ICD-10-CM | POA: Diagnosis not present

## 2020-05-05 DIAGNOSIS — M2041 Other hammer toe(s) (acquired), right foot: Secondary | ICD-10-CM

## 2020-05-05 DIAGNOSIS — M4310 Spondylolisthesis, site unspecified: Secondary | ICD-10-CM | POA: Insufficient documentation

## 2020-05-05 DIAGNOSIS — R202 Paresthesia of skin: Secondary | ICD-10-CM

## 2020-05-05 NOTE — Progress Notes (Addendum)
Subjective: Cynthia Torres presents today for follow up of preventative diabetic foot care and corn(s) left 2nd toe and callus(es) lateral right foot and painful mycotic nails b/l.  Pain interferes with ambulation. Aggravating factors include wearing enclosed shoe gear.   She did not check her blood glucose this morning.  She relates some symptoms of tingling along bunion of right foot on occasion. She does have back problems and states her shoes are over one year old.  PCP is Dr. Lavone Orn. Last visit was 05/02/2020.  No Known Allergies   Objective: There were no vitals filed for this visit.  Pt is a pleasant 75 y.o. year old AA female  in NAD. AAO x 3.   Vascular Examination:  Capillary fill time to digits <3 seconds b/l. Palpable DP pulses b/l. Palpable PT pulses b/l. Pedal hair absent b/l Skin temperature gradient within normal limits b/l. No edema noted b/l.  Dermatological Examination: Pedal skin with normal turgor, texture and tone bilaterally. No open wounds bilaterally. No interdigital macerations bilaterally. Toenails 1-5 b/l elongated, discolored, dystrophic, thickened, crumbly with subungual debris and tenderness to dorsal palpation. Hyperkeratotic lesion(s) L 2nd toe. Firm, fixed soft tissue mass at medial DIPJ.  No erythema, no edema, no drainage, no fluctuance.  Musculoskeletal: Normal muscle strength 5/5 to all lower extremity muscle groups bilaterally. No pain crepitus or joint limitation noted with ROM b/l. Hallux valgus with bunion deformity noted b/l lower extremities. Hammertoes noted to the L 2nd toe.  Neurological: Protective sensation intact 5/5 intact bilaterally with 10g monofilament b/l. Vibratory sensation intact b/l. Proprioception intact bilaterally.   Assessment: 1. Pain due to onychomycosis of toenail   2. Corns   3. Tingling sensation   4. Acquired hammertoes of both feet   5. Mass of soft tissue of foot   6. Diabetes mellitus without  complication ([place])    Plan: -No new findings. No new orders. -We discussed neuritis vs neuropathy vs radiculopathy. Her symptoms are intermittent. Will monitor for now. -Continue diabetic foot care principles. Literature dispensed on today.  -Toenails 1-5 b/l were debrided in length and girth with sterile nail nippers and dremel without iatrogenic bleeding.  -Corn(s) L 2nd toe pared utilizing sterile scalpel blade without incident. Cleansed with alcohol. Triple antibiotic ointment applied. No further treatment required by patient. -Patient to continue soft, supportive shoe gear daily. -Patient to report any pedal injuries to medical professional immediately. -Patient/POA to call should there be question/concern in the interim.  Return in about 3 months (around 08/05/2020).  Marzetta Board, DPM

## 2020-06-24 ENCOUNTER — Ambulatory Visit (INDEPENDENT_AMBULATORY_CARE_PROVIDER_SITE_OTHER): Payer: Medicare Other

## 2020-06-24 ENCOUNTER — Ambulatory Visit
Admission: EM | Admit: 2020-06-24 | Discharge: 2020-06-24 | Disposition: A | Discharge status: Home / Self Care | Payer: Medicare Other | Attending: Family Medicine | Admitting: Family Medicine

## 2020-06-24 ENCOUNTER — Other Ambulatory Visit: Payer: Self-pay

## 2020-06-24 ENCOUNTER — Encounter: Payer: Self-pay | Admitting: Emergency Medicine

## 2020-06-24 DIAGNOSIS — M19071 Primary osteoarthritis, right ankle and foot: Secondary | ICD-10-CM | POA: Diagnosis not present

## 2020-06-24 DIAGNOSIS — J029 Acute pharyngitis, unspecified: Secondary | ICD-10-CM

## 2020-06-24 DIAGNOSIS — M2011 Hallux valgus (acquired), right foot: Secondary | ICD-10-CM | POA: Diagnosis not present

## 2020-06-24 DIAGNOSIS — M79671 Pain in right foot: Secondary | ICD-10-CM

## 2020-06-24 DIAGNOSIS — M21611 Bunion of right foot: Secondary | ICD-10-CM | POA: Diagnosis not present

## 2020-06-24 DIAGNOSIS — M7731 Calcaneal spur, right foot: Secondary | ICD-10-CM | POA: Diagnosis not present

## 2020-06-24 LAB — POCT RAPID STREP A (OFFICE): Rapid Strep A Screen: NEGATIVE

## 2020-06-24 NOTE — Discharge Instructions (Addendum)
You may use over the counter ibuprofen or acetaminophen as needed.  For a sore throat, over the counter products such as [employer] Peroxyl Mouth Sore Rinse or Chloraseptic Sore Throat Spray may provide some temporary relief. Your rapid strep test was negative today. We have sent your throat swab for culture and will let you know of any positive results. 

## 2020-06-24 NOTE — ED Triage Notes (Signed)
Patient c/o sore throat x 1 day.   Patient denies fever.   Patient endorses lymph node swelling on RT side.   Patient denies cough, nasal congestion, and any other cold like symptoms.   Patient has used cough drops and throat spray with no relief of symptoms.   Patient c/o RT foot pain x 2 weeks.   Patient denies fall or trauma.   Patient denies prior history of foot issue.   Patient endorses that nothing makes pain worst, " I'll be sitting her and it will just start burning".   Patient hasn't used any medications for symptoms.

## 2020-06-24 NOTE — ED Provider Notes (Signed)
  [place]   [id number] 06/24/20 Arrival Time: [place]  ASSESSMENT & PLAN:  1. Sore throat   2. Right foot pain   3. Hallux valgus of right foot    ST likely due to virus. Discussed. Rapid strep negative. She sees a podiatrist and has f/u to discuss foot pain likely secondary to bunion pain. OTC analgesics as needed.  Reviewed expectations re: course of current medical issues. Questions answered. Outlined signs and symptoms indicating need for more acute intervention. Understanding verbalized. After Visit Summary given.   SUBJECTIVE: History from: patient. Cynthia Torres is a 75 y.o. female who reports mild ST; 1 day; no fever; 'irritated feeling'. Some allergies; ques relation. Also with R foot pain around bunion; x 2 weeks; 'just really sore'; no swelling. Ambulatory without difficulty.    OBJECTIVE:  Vitals:   06/24/20 0948  BP: 125/79  Pulse: 70  Resp: 20  Temp: 98.3 F (36.8 C)  TempSrc: Oral  SpO2: 96%    General appearance: alert; no distress Eyes: PERRLA; EOMI; conjunctiva normal HENT: [place]; AT; with mild nasal congestion; throat cobblestoning Neck: supple  Lungs: speaks full sentences without difficulty; unlabored Extremities: no edema; inflammation over R foot bunion; no sign of skin infection; TTP Skin: warm and dry Neurologic: normal gait Psychological: alert and cooperative; normal mood and affect  Labs: Results for orders placed or performed during the hospital encounter of 06/24/20  POCT rapid strep A  Result Value Ref Range   Rapid Strep A Screen Negative Negative   Labs Reviewed  POCT RAPID STREP A (OFFICE)    Imaging: No results found.  No Known Allergies  Past Medical History:  Diagnosis Date  . Diabetes mellitus without complication ([place])   . Gout   . HTN (hypertension)   . Hyperlipidemia    Social History   Socioeconomic History  . Marital status: Widowed    Spouse name: Not on file  . Number of children: Not on  file  . Years of education: Not on file  . Highest education level: Not on file  Occupational History  . Not on file  Tobacco Use  . Smoking status: Never Smoker  . Smokeless tobacco: Never Used  Substance and Sexual Activity  . Alcohol use: No    Alcohol/week: 0.0 standard drinks  . Drug use: No  . Sexual activity: Not on file  Other Topics Concern  . Not on file  Social History Narrative  . Not on file   Social Determinants of Health   Financial Resource Strain: Not on file  Food Insecurity: Not on file  Transportation Needs: Not on file  Physical Activity: Not on file  Stress: Not on file  Social Connections: Not on file  Intimate Partner Violence: Not on file   Family History  Problem Relation Age of Onset  . Breast cancer Neg Hx    History reviewed. No pertinent surgical history.   Vanessa Kick, MD 06/26/20 [phone]

## 2020-06-27 DIAGNOSIS — E1169 Type 2 diabetes mellitus with other specified complication: Secondary | ICD-10-CM | POA: Diagnosis not present

## 2020-06-27 DIAGNOSIS — M159 Polyosteoarthritis, unspecified: Secondary | ICD-10-CM | POA: Diagnosis not present

## 2020-06-27 DIAGNOSIS — G8929 Other chronic pain: Secondary | ICD-10-CM | POA: Diagnosis not present

## 2020-06-27 DIAGNOSIS — K219 Gastro-esophageal reflux disease without esophagitis: Secondary | ICD-10-CM | POA: Diagnosis not present

## 2020-06-27 DIAGNOSIS — E7800 Pure hypercholesterolemia, unspecified: Secondary | ICD-10-CM | POA: Diagnosis not present

## 2020-06-27 DIAGNOSIS — I10 Essential (primary) hypertension: Secondary | ICD-10-CM | POA: Diagnosis not present

## 2020-06-27 LAB — CULTURE, GROUP A STREP (THRC)

## 2020-07-03 DIAGNOSIS — H4921 Sixth [abducent] nerve palsy, right eye: Secondary | ICD-10-CM | POA: Diagnosis not present

## 2020-07-03 DIAGNOSIS — H02431 Paralytic ptosis of right eyelid: Secondary | ICD-10-CM | POA: Diagnosis not present

## 2020-07-03 DIAGNOSIS — H4901 Third [oculomotor] nerve palsy, right eye: Secondary | ICD-10-CM | POA: Diagnosis not present

## 2020-07-03 DIAGNOSIS — H02831 Dermatochalasis of right upper eyelid: Secondary | ICD-10-CM | POA: Diagnosis not present

## 2020-07-03 DIAGNOSIS — H0279 Other degenerative disorders of eyelid and periocular area: Secondary | ICD-10-CM | POA: Diagnosis not present

## 2020-07-03 DIAGNOSIS — H02834 Dermatochalasis of left upper eyelid: Secondary | ICD-10-CM | POA: Diagnosis not present

## 2020-07-03 DIAGNOSIS — H53483 Generalized contraction of visual field, bilateral: Secondary | ICD-10-CM | POA: Diagnosis not present

## 2020-07-03 DIAGNOSIS — H57813 Brow ptosis, bilateral: Secondary | ICD-10-CM | POA: Diagnosis not present

## 2020-07-24 DIAGNOSIS — H53481 Generalized contraction of visual field, right eye: Secondary | ICD-10-CM | POA: Diagnosis not present

## 2020-07-24 DIAGNOSIS — H53483 Generalized contraction of visual field, bilateral: Secondary | ICD-10-CM | POA: Diagnosis not present

## 2020-07-24 DIAGNOSIS — H53482 Generalized contraction of visual field, left eye: Secondary | ICD-10-CM | POA: Diagnosis not present

## 2020-08-01 ENCOUNTER — Ambulatory Visit (INDEPENDENT_AMBULATORY_CARE_PROVIDER_SITE_OTHER): Payer: Medicare Other

## 2020-08-01 ENCOUNTER — Other Ambulatory Visit: Payer: Self-pay | Admitting: Podiatry

## 2020-08-01 ENCOUNTER — Encounter: Payer: Self-pay | Admitting: Podiatry

## 2020-08-01 ENCOUNTER — Ambulatory Visit: Payer: Medicare Other | Admitting: Podiatry

## 2020-08-01 ENCOUNTER — Other Ambulatory Visit: Payer: Self-pay

## 2020-08-01 DIAGNOSIS — M76821 Posterior tibial tendinitis, right leg: Secondary | ICD-10-CM

## 2020-08-01 DIAGNOSIS — M778 Other enthesopathies, not elsewhere classified: Secondary | ICD-10-CM

## 2020-08-01 MED ORDER — TRIAMCINOLONE ACETONIDE 40 MG/ML IJ SUSP
20.0000 mg | Freq: Once | INTRAMUSCULAR | Status: AC
  Administered 2020-08-01: 20 mg

## 2020-08-01 NOTE — Progress Notes (Signed)
She presents today after having seen Dr. Adah Perl for quite some time for her nail trimming.  She is complaining of burning times past 2 weeks to the medial aspect of her right foot.  Objective: Vital signs are stable she is alert oriented x3.  Pulses are palpable.  There is no erythema edema cellulitis drainage or odor she does have an area of reactive hyperkeratosis to the medial aspect of the second toe of the left foot.  She also has pain on palpation of the posterior tibial tendon at the navicular tuberosity and just distal to the medial malleolus on the right foot.  Radiographs taken today do not demonstrate any type of osseous abnormalities in this area osteopenia is mildly noted.  Assessment: Posterior tibial tendinitis.  Plan: I injected the area today 20 mg Kenalog 5 mg Marcaine.  Reevaluate her for possible MRI next visit.

## 2020-08-02 DIAGNOSIS — M159 Polyosteoarthritis, unspecified: Secondary | ICD-10-CM | POA: Diagnosis not present

## 2020-08-02 DIAGNOSIS — E7800 Pure hypercholesterolemia, unspecified: Secondary | ICD-10-CM | POA: Diagnosis not present

## 2020-08-02 DIAGNOSIS — E1169 Type 2 diabetes mellitus with other specified complication: Secondary | ICD-10-CM | POA: Diagnosis not present

## 2020-08-02 DIAGNOSIS — K219 Gastro-esophageal reflux disease without esophagitis: Secondary | ICD-10-CM | POA: Diagnosis not present

## 2020-08-02 DIAGNOSIS — G8929 Other chronic pain: Secondary | ICD-10-CM | POA: Diagnosis not present

## 2020-08-02 DIAGNOSIS — I10 Essential (primary) hypertension: Secondary | ICD-10-CM | POA: Diagnosis not present

## 2020-08-02 DIAGNOSIS — E119 Type 2 diabetes mellitus without complications: Secondary | ICD-10-CM | POA: Diagnosis not present

## 2020-08-18 ENCOUNTER — Other Ambulatory Visit: Payer: Self-pay

## 2020-08-18 ENCOUNTER — Ambulatory Visit: Payer: Medicare Other | Admitting: Podiatry

## 2020-08-18 DIAGNOSIS — M79676 Pain in unspecified toe(s): Secondary | ICD-10-CM

## 2020-08-18 DIAGNOSIS — L84 Corns and callosities: Secondary | ICD-10-CM

## 2020-08-18 DIAGNOSIS — B351 Tinea unguium: Secondary | ICD-10-CM | POA: Diagnosis not present

## 2020-08-18 DIAGNOSIS — E119 Type 2 diabetes mellitus without complications: Secondary | ICD-10-CM | POA: Diagnosis not present

## 2020-08-24 ENCOUNTER — Encounter: Payer: Self-pay | Admitting: Podiatry

## 2020-08-24 DIAGNOSIS — G8929 Other chronic pain: Secondary | ICD-10-CM | POA: Insufficient documentation

## 2020-08-24 DIAGNOSIS — M7918 Myalgia, other site: Secondary | ICD-10-CM | POA: Insufficient documentation

## 2020-08-24 DIAGNOSIS — M5430 Sciatica, unspecified side: Secondary | ICD-10-CM | POA: Insufficient documentation

## 2020-08-24 DIAGNOSIS — M461 Sacroiliitis, not elsewhere classified: Secondary | ICD-10-CM | POA: Insufficient documentation

## 2020-08-24 NOTE — Progress Notes (Signed)
  Subjective:  Patient ID: Cynthia Torres, female    DOB: 05/06/1945,  MRN: [id number]  75 y.o. female presents preventative diabetic foot care and corn(s) left 2nd toe120 and painful thick toenails that are difficult to trim. Painful toenails interfere with ambulation. Aggravating factors include wearing enclosed shoe gear. Pain is relieved with periodic professional debridement. Painful corns are aggravated when weightbearing when wearing enclosed shoe gear. Pain is relieved with periodic professional debridement.  Patient states blood glucose was 120 mg/dl today.  She did see Dr.Hyatt for posterior tibial tendonitis of RLE and was given injection. She notes no new concerns on today's visit.  PCP is Dr. Lavone Orn and last visit was two weeks ago per patient recall.  No Known Allergies  Review of Systems: Negative except as noted in the HPI.   Objective:   Constitutional Pt is a pleasant 75 y.o. African American female obese in NAD. AAO x 3.   Vascular Capillary fill time to digits <3 seconds b/l lower extremities. Palpable pedal pulses b/l LE. Pedal hair absent. Lower extremity skin temperature gradient within normal limits. No pain with calf compression b/l. No edema noted b/l lower extremities.  Neurologic Protective sensation intact 5/5 intact bilaterally with 10g monofilament b/l.  Dermatologic Toenails 1-5 b/l elongated, discolored, dystrophic, thickened, crumbly with subungual debris and tenderness to dorsal palpation. Hyperkeratotic lesion(s) L 2nd toe.  No erythema, no edema, no drainage, no fluctuance.  Orthopedic: Normal muscle strength 5/5 to all lower extremity muscle groups bilaterally. No pain crepitus or joint limitation noted with ROM b/l. Hallux valgus with bunion deformity noted b/l lower extremities. Hammertoe(s) noted to the L 2nd toe.   Radiographs: None Assessment:   1. Pain due to onychomycosis of toenail   2. Corns   3. Diabetes mellitus without complication  ([place])    Plan:  Patient was evaluated and treated and all questions answered.  Onychomycosis with pain -Nails palliatively debridement as below. -Educated on self-care  Procedure: Nail Debridement Rationale: Pain Type of Debridement: manual, sharp debridement. Instrumentation: Nail nipper, rotary burr. Number of Nails: 10  -Examined patient. -Continue diabetic foot care principles. -Patient to continue soft, supportive shoe gear daily. -Toenails 1-5 b/l were debrided in length and girth with sterile nail nippers and dremel without iatrogenic bleeding.  -Corn(s) L 2nd toe pared utilizing sterile scalpel blade. Pinpoint bleeding addressed with Lumicain Hemostatic Solution. Triple antibiotic ointment applied. No further treatment required by patient. Total number debrided=1. -Patient to report any pedal injuries to medical professional immediately. -Patient/POA to call should there be question/concern in the interim.  Return in about 3 months (around 11/18/2020).  Marzetta Board, DPM

## 2020-09-06 DIAGNOSIS — M25552 Pain in left hip: Secondary | ICD-10-CM | POA: Diagnosis not present

## 2020-09-06 DIAGNOSIS — M7062 Trochanteric bursitis, left hip: Secondary | ICD-10-CM | POA: Diagnosis not present

## 2020-09-06 DIAGNOSIS — M545 Low back pain, unspecified: Secondary | ICD-10-CM | POA: Diagnosis not present

## 2020-09-22 ENCOUNTER — Telehealth: Payer: Medicare Other | Admitting: Plastic Surgery

## 2020-09-28 DIAGNOSIS — M7062 Trochanteric bursitis, left hip: Secondary | ICD-10-CM | POA: Diagnosis not present

## 2020-10-02 DIAGNOSIS — H57813 Brow ptosis, bilateral: Secondary | ICD-10-CM | POA: Diagnosis not present

## 2020-10-02 DIAGNOSIS — H02431 Paralytic ptosis of right eyelid: Secondary | ICD-10-CM | POA: Diagnosis not present

## 2020-10-02 DIAGNOSIS — H53483 Generalized contraction of visual field, bilateral: Secondary | ICD-10-CM | POA: Diagnosis not present

## 2020-10-02 DIAGNOSIS — H0279 Other degenerative disorders of eyelid and periocular area: Secondary | ICD-10-CM | POA: Diagnosis not present

## 2020-10-02 DIAGNOSIS — H02423 Myogenic ptosis of bilateral eyelids: Secondary | ICD-10-CM | POA: Diagnosis not present

## 2020-10-02 DIAGNOSIS — H02831 Dermatochalasis of right upper eyelid: Secondary | ICD-10-CM | POA: Diagnosis not present

## 2020-10-02 DIAGNOSIS — H02834 Dermatochalasis of left upper eyelid: Secondary | ICD-10-CM | POA: Diagnosis not present

## 2020-10-11 ENCOUNTER — Other Ambulatory Visit: Payer: Self-pay | Admitting: Internal Medicine

## 2020-10-11 DIAGNOSIS — Z1231 Encounter for screening mammogram for malignant neoplasm of breast: Secondary | ICD-10-CM

## 2020-10-19 DIAGNOSIS — H16231 Neurotrophic keratoconjunctivitis, right eye: Secondary | ICD-10-CM | POA: Diagnosis not present

## 2020-10-23 DIAGNOSIS — H16231 Neurotrophic keratoconjunctivitis, right eye: Secondary | ICD-10-CM | POA: Diagnosis not present

## 2020-10-30 DIAGNOSIS — Z09 Encounter for follow-up examination after completed treatment for conditions other than malignant neoplasm: Secondary | ICD-10-CM | POA: Diagnosis not present

## 2020-10-30 DIAGNOSIS — H16231 Neurotrophic keratoconjunctivitis, right eye: Secondary | ICD-10-CM | POA: Diagnosis not present

## 2020-10-30 DIAGNOSIS — E1169 Type 2 diabetes mellitus with other specified complication: Secondary | ICD-10-CM | POA: Diagnosis not present

## 2020-10-30 DIAGNOSIS — E210 Primary hyperparathyroidism: Secondary | ICD-10-CM | POA: Diagnosis not present

## 2020-10-30 DIAGNOSIS — K219 Gastro-esophageal reflux disease without esophagitis: Secondary | ICD-10-CM | POA: Diagnosis not present

## 2020-10-30 DIAGNOSIS — I10 Essential (primary) hypertension: Secondary | ICD-10-CM | POA: Diagnosis not present

## 2020-11-24 ENCOUNTER — Other Ambulatory Visit: Payer: Self-pay

## 2020-11-24 ENCOUNTER — Ambulatory Visit: Payer: Medicare Other | Admitting: Podiatry

## 2020-11-24 DIAGNOSIS — L84 Corns and callosities: Secondary | ICD-10-CM | POA: Diagnosis not present

## 2020-11-24 DIAGNOSIS — B351 Tinea unguium: Secondary | ICD-10-CM

## 2020-11-24 DIAGNOSIS — E119 Type 2 diabetes mellitus without complications: Secondary | ICD-10-CM

## 2020-11-24 DIAGNOSIS — M79676 Pain in unspecified toe(s): Secondary | ICD-10-CM

## 2020-11-28 ENCOUNTER — Encounter: Payer: Self-pay | Admitting: Podiatry

## 2020-11-28 NOTE — Progress Notes (Signed)
Subjective: Cynthia Torres is a 75 y.o. female patient seen today for preventative diabetic foot care. She is seen for follow up of  painful lesion left 2nd toe and painful thick toenails that are difficult to trim. Pain interferes with ambulation. Aggravating factors include wearing enclosed shoe gear. Pain is relieved with periodic professional debridement.  New problems reported today: None.  PCP is Lavone Orn, MD. Last visit was: 06/27/2020.  No Known Allergies   Objective: Physical Exam  General: Patient is a pleasant 75 y.o. African American female in NAD. AAO x 3.   Neurovascular Examination: Capillary fill time to digits <3 seconds b/l lower extremities. Palpable DP pulse(s) b/l lower extremities Palpable PT pulse(s) b/l lower extremities Pedal hair absent. Lower extremity skin temperature gradient within normal limits. No pain with calf compression b/l. No edema noted b/l lower extremities.  Protective sensation intact 5/5 intact bilaterally with 10g monofilament b/l.  Dermatological:  Skin warm and supple b/l lower extremities. No open wounds b/l lower extremities. No interdigital macerations b/l lower extremities. Toenails 1-5 b/l elongated, discolored, dystrophic, thickened, crumbly with subungual debris and tenderness to dorsal palpation. Hyperkeratotic lesion(s) L 2nd toe at medial DIPJ.  No erythema, no edema, no drainage, no fluctuance.  Musculoskeletal:  Normal muscle strength 5/5 to all lower extremity muscle groups bilaterally. Hallux valgus with bunion deformity noted b/l lower extremities. Hammertoe(s) noted to the L 2nd toe.  Assessment: 1. Pain due to onychomycosis of toenail   2. Corns   3. Diabetes mellitus without complication ([place])    Plan: Patient was evaluated and treated and all questions answered. Consent given for treatment as described below: -No new findings. No new orders. -Continue diabetic foot care principles: inspect feet daily, monitor  glucose as recommended by PCP and/or Endocrinologist, and follow prescribed diet per PCP, Endocrinologist and/or dietician. -Patient to continue soft, supportive shoe gear daily. -Toenails 1-5 b/l were debrided in length and girth with sterile nail nippers and dremel without iatrogenic bleeding.  -Corn(s) L 2nd toe pared utilizing sterile scalpel blade without complication or incident. Total number debrided=1. -Patient to report any pedal injuries to medical professional immediately. -Patient/POA to call should there be question/concern in the interim.  Return in about 3 months (around 02/23/2021).  Marzetta Board, DPM

## 2020-11-30 ENCOUNTER — Ambulatory Visit
Admission: RE | Admit: 2020-11-30 | Discharge: 2020-11-30 | Disposition: A | Discharge status: Home / Self Care | Payer: Medicare Other | Source: Ambulatory Visit | Attending: Internal Medicine | Admitting: Internal Medicine

## 2020-11-30 ENCOUNTER — Other Ambulatory Visit: Payer: Self-pay

## 2020-11-30 DIAGNOSIS — Z1231 Encounter for screening mammogram for malignant neoplasm of breast: Secondary | ICD-10-CM | POA: Diagnosis not present

## 2020-12-20 DIAGNOSIS — Z23 Encounter for immunization: Secondary | ICD-10-CM | POA: Diagnosis not present

## 2021-01-01 DIAGNOSIS — Z09 Encounter for follow-up examination after completed treatment for conditions other than malignant neoplasm: Secondary | ICD-10-CM | POA: Diagnosis not present

## 2021-01-01 DIAGNOSIS — H16231 Neurotrophic keratoconjunctivitis, right eye: Secondary | ICD-10-CM | POA: Diagnosis not present

## 2021-01-01 DIAGNOSIS — H5010 Unspecified exotropia: Secondary | ICD-10-CM | POA: Diagnosis not present

## 2021-03-09 ENCOUNTER — Other Ambulatory Visit: Payer: Self-pay

## 2021-03-09 ENCOUNTER — Ambulatory Visit: Payer: Medicare Other | Admitting: Podiatry

## 2021-03-09 ENCOUNTER — Encounter: Payer: Self-pay | Admitting: Podiatry

## 2021-03-09 DIAGNOSIS — M2012 Hallux valgus (acquired), left foot: Secondary | ICD-10-CM | POA: Diagnosis not present

## 2021-03-09 DIAGNOSIS — B351 Tinea unguium: Secondary | ICD-10-CM | POA: Diagnosis not present

## 2021-03-09 DIAGNOSIS — M79676 Pain in unspecified toe(s): Secondary | ICD-10-CM | POA: Diagnosis not present

## 2021-03-09 DIAGNOSIS — M2011 Hallux valgus (acquired), right foot: Secondary | ICD-10-CM

## 2021-03-09 DIAGNOSIS — E119 Type 2 diabetes mellitus without complications: Secondary | ICD-10-CM

## 2021-03-09 DIAGNOSIS — M2042 Other hammer toe(s) (acquired), left foot: Secondary | ICD-10-CM

## 2021-03-09 DIAGNOSIS — L989 Disorder of the skin and subcutaneous tissue, unspecified: Secondary | ICD-10-CM

## 2021-03-09 DIAGNOSIS — M2041 Other hammer toe(s) (acquired), right foot: Secondary | ICD-10-CM

## 2021-03-09 NOTE — Progress Notes (Signed)
ANNUAL DIABETIC FOOT EXAM  Subjective: Cynthia Torres presents today for for annual diabetic foot examination, follow up painful lesion left 2nd toe, and painful elongated mycotic toenails 1-5 bilaterally which are tender when wearing enclosed shoe gear. Pain is relieved with periodic professional debridement..  Patient relates 20 year h/o diabetes.  Patient denies any h/o foot wounds.  Patient states she is not required to check blood glucose daily.  Lavone Orn, MD is patient's PCP. Last visit was 10/30/2020.  Past Medical History:  Diagnosis Date   Diabetes mellitus without complication ([place])    Gout    HTN (hypertension)    Hyperlipidemia    Patient Active Problem List   Diagnosis Date Noted   Chronic pain 08/24/2020   Myofascial pain 08/24/2020   Sacroiliitis, not elsewhere classified ([place]) 08/24/2020   Sciatica 08/24/2020   Acquired spondylolisthesis 05/05/2020   Displacement of lumbar intervertebral disc without myelopathy 05/05/2020   Acquired ptosis of eyelid of both eyes 09/03/2019   Primary osteoarthritis of first carpometacarpal joint of right hand 05/21/2019   Trigger middle finger of right hand 05/21/2019   Cranial neuritis 02/23/2018   History reviewed. No pertinent surgical history. Current Outpatient Medications on File Prior to Visit  Medication Sig Dispense Refill   allopurinol (ZYLOPRIM) 300 MG tablet      amLODipine (NORVASC) 5 MG tablet Take 5 mg by mouth daily.     atenolol (TENORMIN) 100 MG tablet Take 100 mg by mouth daily.     atorvastatin (LIPITOR) 40 MG tablet Take 40 mg by mouth daily.     fosinopril (MONOPRIL) 40 MG tablet Take 40 mg by mouth daily.     HYDROcodone-acetaminophen (NORCO/VICODIN) 5-325 MG tablet Take 1 tablet by mouth every 6 (six) hours as needed for moderate pain. 30 tablet 0   hydrocortisone 1 % ointment Apply 1 application topically 2 (two) times daily. 30 g 0   lisinopril-hydrochlorothiazide (PRINZIDE,ZESTORETIC) 20-25 MG  tablet      MEDROL 4 MG TBPK tablet Take as directed on pack over 6 days     meloxicam (MOBIC) 15 MG tablet Take 1 tablet (15 mg total) by mouth daily. 30 tablet 0   metFORMIN (GLUCOPHAGE) 500 MG tablet Take by mouth 2 (two) times daily with a meal.     metFORMIN (GLUCOPHAGE-XR) 500 MG 24 hr tablet Take 1,000 mg by mouth 2 (two) times daily.     methylPREDNISolone acetate (DEPO-MEDROL) 40 MG/ML injection Inject 80 mg into the muscle once.     moxifloxacin (VIGAMOX) 0.5 % ophthalmic solution Place 1 drop into the right eye 4 (four) times daily.     neomycin-polymyxin b-dexamethasone (MAXITROL) 3.5-10000-0.1 OINT      NONFORMULARY OR COMPOUNDED ITEM Apply 1-2 g topically 4 (four) times daily. 120 each 2   ONETOUCH VERIO test strip      potassium chloride (MICRO-K) 10 MEQ CR capsule Take by mouth 2 (two) times daily.     prednisoLONE acetate (PRED FORTE) 1 % ophthalmic suspension      PROLENSA 0.07 % SOLN Place 1 drop into the right eye daily.     rosuvastatin (CRESTOR) 10 MG tablet Take 5 mg by mouth once.     rosuvastatin (CRESTOR) 5 MG tablet      Current Facility-Administered Medications on File Prior to Visit  Medication Dose Route Frequency Provider Last Rate Last Admin   betamethasone acetate-betamethasone sodium phosphate (CELESTONE) injection 3 mg  3 mg Intramuscular Once Edrick Kins, DPM  No Known Allergies Social History   Occupational History   Not on file  Tobacco Use   Smoking status: Never   Smokeless tobacco: Never  Substance and Sexual Activity   Alcohol use: No    Alcohol/week: 0.0 standard drinks   Drug use: No   Sexual activity: Not on file   Family History  Problem Relation Age of Onset   Breast cancer Neg Hx    Immunization History  Administered Date(s) Administered   Influenza, High Dose Seasonal PF 12/17/2017   PFIZER(Purple Top)SARS-COV-2 Vaccination 05/09/2019, 06/08/2019     Review of Systems: Negative except as noted in the HPI.    Objective: There were no vitals filed for this visit.  Cynthia Torres is a pleasant 76 y.o. female in NAD. AAO X 3.  Vascular Examination: CFT <3 seconds b/l LE. Palpable DP/PT pulses b/l LE. Digital hair absent b/l. Skin temperature gradient WNL b/l. No pain with calf compression b/l. No edema noted b/l. No cyanosis or clubbing noted b/l LE.  Dermatological Examination: Pedal integument with normal turgor, texture and tone BLE. No open wounds b/l LE. No interdigital macerations noted b/l LE. Toenails 1-5 b/l elongated, discolored, dystrophic, thickened, crumbly with subungual debris and tenderness to dorsal palpation. Raised hyperkeratotic lesion noted medial aspect left 2nd toe DIPJ. No erythema, no edema, no drainage, no fluctuance.  Musculoskeletal Examination: Muscle strength 5/5 to all lower extremity muscle groups bilaterally. No pain, crepitus or joint limitation noted with ROM bilateral LE. HAV with bunion deformity noted b/l LE. Hammertoe(s) noted to the L 2nd toe.  Footwear Assessment: Does the patient wear appropriate shoes? Yes. Does the patient need inserts/orthotics? No.  Neurological Examination: Protective sensation intact 5/5 intact bilaterally with 10g monofilament b/l. Vibratory sensation intact b/l. Deep tendon reflexes normal b/l.   Assessment: 1. Pain due to onychomycosis of toenail   2. Skin lesion of foot   3. Acquired hammertoes of both feet   4. Hallux valgus, acquired, bilateral   5. Diabetes mellitus without complication ([place])   6. Encounter for diabetic foot exam ([place])     ADA Risk Categorization: Low Risk :  Patient has all of the following: Intact protective sensation No prior foot ulcer  No severe deformity Pedal pulses present  Plan: -Lesion left 2nd toe pared. We discussed biopsy of lesion and she would like to proceed at her next scheduled visit. Will schedule her with Dr.Sikora for biopsy of lesion left 2nd toe . -Diabetic foot  examination performed today. -Continue foot and shoe inspections daily. Monitor blood glucose per PCP/Endocrinologist's recommendations. -Mycotic toenails 1-5 bilaterally were debrided in length and girth with sterile nail nippers and dremel without incident. -Patient/POA to call should there be question/concern in the interim.  Return in about 3 months (around 06/07/2021).  Marzetta Board, DPM

## 2021-04-02 DIAGNOSIS — G8929 Other chronic pain: Secondary | ICD-10-CM | POA: Diagnosis not present

## 2021-04-02 DIAGNOSIS — K219 Gastro-esophageal reflux disease without esophagitis: Secondary | ICD-10-CM | POA: Diagnosis not present

## 2021-04-02 DIAGNOSIS — E1169 Type 2 diabetes mellitus with other specified complication: Secondary | ICD-10-CM | POA: Diagnosis not present

## 2021-04-02 DIAGNOSIS — I10 Essential (primary) hypertension: Secondary | ICD-10-CM | POA: Diagnosis not present

## 2021-04-02 DIAGNOSIS — E119 Type 2 diabetes mellitus without complications: Secondary | ICD-10-CM | POA: Diagnosis not present

## 2021-04-02 DIAGNOSIS — E7800 Pure hypercholesterolemia, unspecified: Secondary | ICD-10-CM | POA: Diagnosis not present

## 2021-04-10 DIAGNOSIS — M5416 Radiculopathy, lumbar region: Secondary | ICD-10-CM | POA: Diagnosis not present

## 2021-04-19 DIAGNOSIS — M5416 Radiculopathy, lumbar region: Secondary | ICD-10-CM | POA: Diagnosis not present

## 2021-04-23 ENCOUNTER — Ambulatory Visit: Payer: Medicare Other | Admitting: Podiatry

## 2021-04-24 ENCOUNTER — Ambulatory Visit (INDEPENDENT_AMBULATORY_CARE_PROVIDER_SITE_OTHER): Payer: Medicare Other

## 2021-04-24 ENCOUNTER — Other Ambulatory Visit: Payer: Self-pay

## 2021-04-24 ENCOUNTER — Ambulatory Visit: Payer: Medicare Other | Admitting: Podiatry

## 2021-04-24 ENCOUNTER — Encounter: Payer: Self-pay | Admitting: Podiatry

## 2021-04-24 DIAGNOSIS — D2372 Other benign neoplasm of skin of left lower limb, including hip: Secondary | ICD-10-CM

## 2021-04-24 DIAGNOSIS — L603 Nail dystrophy: Secondary | ICD-10-CM | POA: Diagnosis not present

## 2021-04-24 DIAGNOSIS — E119 Type 2 diabetes mellitus without complications: Secondary | ICD-10-CM

## 2021-04-24 DIAGNOSIS — M7989 Other specified soft tissue disorders: Secondary | ICD-10-CM

## 2021-04-24 DIAGNOSIS — B351 Tinea unguium: Secondary | ICD-10-CM | POA: Diagnosis not present

## 2021-04-24 NOTE — Progress Notes (Signed)
°  Subjective:  Patient ID: Cynthia Torres, female    DOB: [birth year],   MRN: [id number]  Chief Complaint  Patient presents with   Toe Pain     lesion left 2nd toe    76 y.o. female presents for follow-up of left second toe lesion that has been treated by Dr. Elisha Ponder. Here today to obtain biopsy to evaluate soft tissue mass area.  . Denies any other pedal complaints. Denies n/v/f/c.   Past Medical History:  Diagnosis Date   Diabetes mellitus without complication ([place])    Gout    HTN (hypertension)    Hyperlipidemia     Objective:  Physical Exam: Vascular: DP/PT pulses 2/4 bilateral. CFT <3 seconds. Normal hair growth on digits. No edema.  Skin. No lacerations or abrasions bilateral feet. Dorsal left second digit with 1 cm lesion raised with hyperkeratotic appearance and some discoloration. Upon debridement bleeding noted throughout lesion.  Musculoskeletal: MMT 5/5 bilateral lower extremities in DF, PF, Inversion and Eversion. Deceased ROM in DF of ankle joint.  Neurological: Sensation intact to light touch.   Assessment:   1. Mass of soft tissue of foot   2. Diabetes mellitus without complication ([place])      Plan:  Patient was evaluated and treated and all questions answered. -Discussed lesions and their etiology with patient and treatment options.  X-rays with no acute fracutes or bony changes noted.  -Shave biopsy was taken of left second digit lesion. No anesthesia needed due to neuropathy. Patient tolerated procedure well. Bandaid and neosporin applied to area and post procedure protocol discussed.  -Biopsy sent for pathology.   -Patient to return to office in 3 weeks or sooner if condition worsens.   Lorenda Peck, DPM

## 2021-04-24 NOTE — Progress Notes (Signed)
left

## 2021-04-26 DIAGNOSIS — M25532 Pain in left wrist: Secondary | ICD-10-CM | POA: Diagnosis not present

## 2021-04-26 DIAGNOSIS — M25512 Pain in left shoulder: Secondary | ICD-10-CM | POA: Diagnosis not present

## 2021-04-26 DIAGNOSIS — M542 Cervicalgia: Secondary | ICD-10-CM | POA: Diagnosis not present

## 2021-05-08 DIAGNOSIS — M25552 Pain in left hip: Secondary | ICD-10-CM | POA: Diagnosis not present

## 2021-05-15 ENCOUNTER — Ambulatory Visit: Payer: Medicare Other

## 2021-05-15 ENCOUNTER — Other Ambulatory Visit: Payer: Self-pay

## 2021-05-15 ENCOUNTER — Encounter: Payer: Self-pay | Admitting: Podiatry

## 2021-05-15 ENCOUNTER — Ambulatory Visit: Payer: Medicare Other | Admitting: Podiatry

## 2021-05-15 DIAGNOSIS — L989 Disorder of the skin and subcutaneous tissue, unspecified: Secondary | ICD-10-CM

## 2021-05-15 DIAGNOSIS — M79675 Pain in left toe(s): Secondary | ICD-10-CM | POA: Diagnosis not present

## 2021-05-15 DIAGNOSIS — M7989 Other specified soft tissue disorders: Secondary | ICD-10-CM

## 2021-05-15 NOTE — Progress Notes (Signed)
?  Subjective:  ?Patient ID: Cynthia Torres, female    DOB: Aug 20, 1945,   MRN: [id number] ? ?Chief Complaint  ?Patient presents with  ? Ulcer  ?  Left foot ulcer  ? ? ?76 y.o. female presents for follow-up of left second toe lesion that has been treated by Dr. Elisha Ponder. Here today to obtain biopsy tresults. Denies any other pedal complaints. Denies n/v/f/c.  ? ?Past Medical History:  ?Diagnosis Date  ? Diabetes mellitus without complication ([place])   ? Gout   ? HTN (hypertension)   ? Hyperlipidemia   ? ? ?Objective:  ?Physical Exam: ?Vascular: DP/PT pulses 2/4 bilateral. CFT <3 seconds. Normal hair growth on digits. No edema.  ?Skin. No lacerations or abrasions bilateral feet. Dorsal left second digit area well healed. No lesion present today.  ?Musculoskeletal: MMT 5/5 bilateral lower extremities in DF, PF, Inversion and Eversion. Deceased ROM in DF of ankle joint.  ?Neurological: Sensation intact to light touch.  ? ?Assessment:  ? ?1. Mass of soft tissue of foot   ?2. Pain of toe of left foot   ?3. Skin lesion of foot   ? ? ? ?Plan:  ?Patient was evaluated and treated and all questions answered. ?-Discussed lesions and their etiology with patient and treatment options.  ?X-rays with no acute fracutes or bony changes noted.  ?-Have not received results of biopsy yet. Will inquire as to wear it is.  ?-Patient doing well and area healed.  ?-Patient to return in 2 months for re-evaluation and possible redo of biopsy if unable to find specimen and results.  ? ? ?Lorenda Peck, DPM  ? ? ?

## 2021-05-16 DIAGNOSIS — Z0000 Encounter for general adult medical examination without abnormal findings: Secondary | ICD-10-CM | POA: Diagnosis not present

## 2021-05-16 DIAGNOSIS — E1169 Type 2 diabetes mellitus with other specified complication: Secondary | ICD-10-CM | POA: Diagnosis not present

## 2021-05-16 DIAGNOSIS — Z1389 Encounter for screening for other disorder: Secondary | ICD-10-CM | POA: Diagnosis not present

## 2021-05-16 DIAGNOSIS — M109 Gout, unspecified: Secondary | ICD-10-CM | POA: Diagnosis not present

## 2021-05-16 DIAGNOSIS — E7800 Pure hypercholesterolemia, unspecified: Secondary | ICD-10-CM | POA: Diagnosis not present

## 2021-05-16 DIAGNOSIS — I10 Essential (primary) hypertension: Secondary | ICD-10-CM | POA: Diagnosis not present

## 2021-05-16 DIAGNOSIS — E210 Primary hyperparathyroidism: Secondary | ICD-10-CM | POA: Diagnosis not present

## 2021-05-16 DIAGNOSIS — M48061 Spinal stenosis, lumbar region without neurogenic claudication: Secondary | ICD-10-CM | POA: Diagnosis not present

## 2021-05-17 DIAGNOSIS — D320 Benign neoplasm of cerebral meninges: Secondary | ICD-10-CM | POA: Diagnosis not present

## 2021-06-12 ENCOUNTER — Encounter: Payer: Self-pay | Admitting: Podiatry

## 2021-06-12 ENCOUNTER — Ambulatory Visit: Payer: Medicare Other | Admitting: Podiatry

## 2021-06-12 DIAGNOSIS — M79676 Pain in unspecified toe(s): Secondary | ICD-10-CM | POA: Diagnosis not present

## 2021-06-12 DIAGNOSIS — M48061 Spinal stenosis, lumbar region without neurogenic claudication: Secondary | ICD-10-CM | POA: Insufficient documentation

## 2021-06-12 DIAGNOSIS — F5101 Primary insomnia: Secondary | ICD-10-CM | POA: Insufficient documentation

## 2021-06-12 DIAGNOSIS — K219 Gastro-esophageal reflux disease without esophagitis: Secondary | ICD-10-CM | POA: Insufficient documentation

## 2021-06-12 DIAGNOSIS — L84 Corns and callosities: Secondary | ICD-10-CM

## 2021-06-12 DIAGNOSIS — M1990 Unspecified osteoarthritis, unspecified site: Secondary | ICD-10-CM | POA: Insufficient documentation

## 2021-06-12 DIAGNOSIS — E119 Type 2 diabetes mellitus without complications: Secondary | ICD-10-CM | POA: Diagnosis not present

## 2021-06-12 DIAGNOSIS — K581 Irritable bowel syndrome with constipation: Secondary | ICD-10-CM | POA: Insufficient documentation

## 2021-06-12 DIAGNOSIS — B351 Tinea unguium: Secondary | ICD-10-CM

## 2021-06-12 DIAGNOSIS — M109 Gout, unspecified: Secondary | ICD-10-CM | POA: Insufficient documentation

## 2021-06-12 DIAGNOSIS — E1169 Type 2 diabetes mellitus with other specified complication: Secondary | ICD-10-CM | POA: Insufficient documentation

## 2021-06-12 DIAGNOSIS — Z6841 Body Mass Index (BMI) 40.0 and over, adult: Secondary | ICD-10-CM | POA: Insufficient documentation

## 2021-06-12 DIAGNOSIS — E785 Hyperlipidemia, unspecified: Secondary | ICD-10-CM | POA: Insufficient documentation

## 2021-06-12 DIAGNOSIS — I10 Essential (primary) hypertension: Secondary | ICD-10-CM | POA: Insufficient documentation

## 2021-06-12 DIAGNOSIS — E7800 Pure hypercholesterolemia, unspecified: Secondary | ICD-10-CM | POA: Insufficient documentation

## 2021-06-12 DIAGNOSIS — R600 Localized edema: Secondary | ICD-10-CM | POA: Diagnosis not present

## 2021-06-12 DIAGNOSIS — E210 Primary hyperparathyroidism: Secondary | ICD-10-CM | POA: Insufficient documentation

## 2021-06-12 NOTE — Patient Instructions (Signed)
Compression Stockings: ? ?[facility] ?[address] ?[place], [place] [address] ? ?[phone]  ?

## 2021-06-14 ENCOUNTER — Other Ambulatory Visit (HOSPITAL_COMMUNITY): Payer: Self-pay | Admitting: Orthopaedic Surgery

## 2021-06-14 ENCOUNTER — Other Ambulatory Visit: Payer: Self-pay | Admitting: Orthopaedic Surgery

## 2021-06-14 DIAGNOSIS — M79662 Pain in left lower leg: Secondary | ICD-10-CM

## 2021-06-14 DIAGNOSIS — M25552 Pain in left hip: Secondary | ICD-10-CM | POA: Diagnosis not present

## 2021-06-14 DIAGNOSIS — T84039A Mechanical loosening of unspecified internal prosthetic joint, initial encounter: Secondary | ICD-10-CM

## 2021-06-15 ENCOUNTER — Ambulatory Visit (HOSPITAL_COMMUNITY)
Admission: RE | Admit: 2021-06-15 | Discharge: 2021-06-15 | Disposition: A | Discharge status: Home / Self Care | Payer: Medicare Other | Source: Ambulatory Visit | Attending: Orthopaedic Surgery | Admitting: Orthopaedic Surgery

## 2021-06-15 DIAGNOSIS — M79662 Pain in left lower leg: Secondary | ICD-10-CM | POA: Insufficient documentation

## 2021-06-15 NOTE — Progress Notes (Signed)
Left lower extremity venous duplex completed. ?Refer to "CV Proc" under chart review to view preliminary results. ? ?06/15/2021 8:41 AM ?Kelby Aline., MHA, RVT, RDCS, RDMS   ?

## 2021-06-17 NOTE — Progress Notes (Signed)
?  Subjective:  ?Patient ID: Cynthia Torres, female    DOB: 1945-03-20,  MRN: [id number] ? ?Cynthia Torres presents to clinic today for preventative diabetic foot care with painful lesion left 2nd digit and painful thick toenails that are difficult to trim. Pain interferes with ambulation. Aggravating factors include wearing enclosed shoe gear. Pain is relieved with periodic professional debridement. ? ?Patient did not check blood glucose today. ? ?Patient states she did have biopsy performed, but did not receive results to the specimen being lost. She is concerned she was charged a copay for that visit. ? ?She relates she has chronic edema of LLE. She does not wear compression hose.  ? ?PCP is Lavone Orn, MD , and last visit was April 02, 2021. ? ?Allergies  ?Allergen Reactions  ? Atorvastatin   ?  Other reaction(s): myalgias  ? ?Review of Systems: Negative except as noted in the HPI. ? ?Objective: No changes noted in today's physical examination. ?Cynthia Torres is a pleasant 76 y.o. female in NAD. AAO X 3. ? ?Vascular Examination: ?CFT <3 seconds b/l LE. Palpable DP/PT pulses b/l LE. Digital hair absent b/l. Skin temperature gradient WNL b/l. No pain with calf compression b/l. No edema noted b/l. No cyanosis or clubbing noted b/l LE. ? ?Dermatological Examination: ?Pedal integument with normal turgor, texture and tone BLE. No open wounds b/l LE. No interdigital macerations noted b/l LE. Toenails 1-5 b/l elongated, discolored, dystrophic, thickened, crumbly with subungual debris and tenderness to dorsal palpation. Raised hyperkeratotic lesion noted medial aspect left 2nd toe DIPJ. Left lower extremity with +1 edema LLE. No pain on palpation of calf. ? ?Musculoskeletal Examination: ?Muscle strength 5/5 to all lower extremity muscle groups bilaterally. No pain, crepitus or joint limitation noted with ROM bilateral LE. HAV with bunion deformity noted b/l LE. Hammertoe(s) noted to the L 2nd toe. ? ?Neurological  Examination: ?Protective sensation intact 5/5 intact bilaterally with 10g monofilament b/l. Vibratory sensation intact b/l. Deep tendon reflexes normal b/l.  ? ?Assessment/Plan: ?1. Pain due to onychomycosis of toenail   ?2. Corns   ?3. Localized edema   ?4. Diabetes mellitus without complication ([place])   ?  ?-Patient was evaluated and treated. All patient's and/or POA's questions/concerns answered on today's visit.Will discuss charge for copay at last visit with management. ?-Lesion left 2nd toe pared. ?-Mycotic toenails 1-5 bilaterally were debrided in length and girth with sterile nail nippers and dremel without incident. ?-Rx written for compression hose 15-20 mmHg, level knee high. Request that patient's left leg be measured.Patient given information for [employer] in [place], [place], for stockings ?-Patient/POA to call should there be question/concern in the interim.  ? ?Return in about 3 months (around 09/11/2021). ? ?Marzetta Board, DPM  ?

## 2021-06-19 DIAGNOSIS — M25552 Pain in left hip: Secondary | ICD-10-CM | POA: Diagnosis not present

## 2021-06-22 ENCOUNTER — Encounter (HOSPITAL_COMMUNITY)
Admission: RE | Admit: 2021-06-22 | Discharge: 2021-06-22 | Disposition: A | Discharge status: Home / Self Care | Payer: Medicare Other | Source: Ambulatory Visit | Attending: Orthopaedic Surgery | Admitting: Orthopaedic Surgery

## 2021-06-22 DIAGNOSIS — T84039A Mechanical loosening of unspecified internal prosthetic joint, initial encounter: Secondary | ICD-10-CM | POA: Insufficient documentation

## 2021-06-22 DIAGNOSIS — G8929 Other chronic pain: Secondary | ICD-10-CM | POA: Diagnosis not present

## 2021-06-22 DIAGNOSIS — M25552 Pain in left hip: Secondary | ICD-10-CM | POA: Diagnosis not present

## 2021-06-22 MED ORDER — TECHNETIUM TC 99M MEDRONATE IV KIT
20.0000 | PACK | Freq: Once | INTRAVENOUS | Status: AC | PRN
  Administered 2021-06-22: 20 via INTRAVENOUS

## 2021-07-25 ENCOUNTER — Ambulatory Visit: Payer: Medicare Other | Admitting: Podiatry

## 2021-08-03 DIAGNOSIS — M898X5 Other specified disorders of bone, thigh: Secondary | ICD-10-CM | POA: Diagnosis not present

## 2021-08-14 DIAGNOSIS — H4901 Third [oculomotor] nerve palsy, right eye: Secondary | ICD-10-CM | POA: Diagnosis not present

## 2021-08-14 DIAGNOSIS — H524 Presbyopia: Secondary | ICD-10-CM | POA: Diagnosis not present

## 2021-08-14 DIAGNOSIS — H353132 Nonexudative age-related macular degeneration, bilateral, intermediate dry stage: Secondary | ICD-10-CM | POA: Diagnosis not present

## 2021-08-14 DIAGNOSIS — M25552 Pain in left hip: Secondary | ICD-10-CM | POA: Diagnosis not present

## 2021-08-14 DIAGNOSIS — E119 Type 2 diabetes mellitus without complications: Secondary | ICD-10-CM | POA: Diagnosis not present

## 2021-08-20 DIAGNOSIS — R791 Abnormal coagulation profile: Secondary | ICD-10-CM | POA: Diagnosis not present

## 2021-08-20 DIAGNOSIS — I10 Essential (primary) hypertension: Secondary | ICD-10-CM | POA: Diagnosis not present

## 2021-08-20 DIAGNOSIS — R2243 Localized swelling, mass and lump, lower limb, bilateral: Secondary | ICD-10-CM | POA: Diagnosis not present

## 2021-08-21 ENCOUNTER — Other Ambulatory Visit: Payer: Self-pay | Admitting: Internal Medicine

## 2021-08-21 ENCOUNTER — Other Ambulatory Visit: Payer: Medicare Other

## 2021-08-21 ENCOUNTER — Ambulatory Visit
Admission: RE | Admit: 2021-08-21 | Discharge: 2021-08-21 | Disposition: A | Discharge status: Home / Self Care | Payer: Medicare Other | Source: Ambulatory Visit | Attending: Internal Medicine | Admitting: Internal Medicine

## 2021-08-21 DIAGNOSIS — R7989 Other specified abnormal findings of blood chemistry: Secondary | ICD-10-CM

## 2021-08-21 DIAGNOSIS — M7989 Other specified soft tissue disorders: Secondary | ICD-10-CM | POA: Diagnosis not present

## 2021-08-22 ENCOUNTER — Ambulatory Visit: Payer: Medicare Other | Admitting: Podiatry

## 2021-08-22 ENCOUNTER — Encounter: Payer: Self-pay | Admitting: Podiatry

## 2021-08-22 DIAGNOSIS — L84 Corns and callosities: Secondary | ICD-10-CM

## 2021-08-22 DIAGNOSIS — E119 Type 2 diabetes mellitus without complications: Secondary | ICD-10-CM | POA: Diagnosis not present

## 2021-08-22 NOTE — Progress Notes (Signed)
  Subjective:  Patient ID: Cynthia Torres, female    DOB: 14-Jan-1946,   MRN: [id number]  Chief Complaint  Patient presents with   Foot Pain    Left foot pain- patient states foot pain has not gotten any better since the last visit    76 y.o. female presents for follow-up of left second toe lesion Here to discuss biopsy results. Relates the swelling in her ankle has been painful. Denies any other pedal complaints. Denies n/v/f/c.   Past Medical History:  Diagnosis Date   Diabetes mellitus without complication ([place])    Gout    HTN (hypertension)    Hyperlipidemia     Objective:  Physical Exam: Vascular: DP/PT pulses 2/4 bilateral. CFT <3 seconds. Normal hair growth on digits. No edema.  Skin. No lacerations or abrasions bilateral feet. Dorsal left second digit area well healed. No lesion present today.  Musculoskeletal: MMT 5/5 bilateral lower extremities in DF, PF, Inversion and Eversion. Deceased ROM in DF of ankle joint.  Neurological: Sensation intact to light touch.   Assessment:   1. Diabetes mellitus without complication ([place])   2. Corns       Plan:  Patient was evaluated and treated and all questions answered. -Discussed lesions and their etiology with patient and treatment options.  X-rays with no acute fracutes or bony changes noted.  -Biopsy results showing some nail unit present and possible fungus other wise normal skin tissue and nothing of note.  -Hyperkeratosis debrided as courtesy today.  -Patient to return as scheduled with Dr. Elisha Ponder for rfc.    Lorenda Peck, DPM

## 2021-09-14 ENCOUNTER — Ambulatory Visit: Payer: Medicare Other | Admitting: Podiatry

## 2021-09-14 ENCOUNTER — Encounter: Payer: Self-pay | Admitting: Podiatry

## 2021-09-14 DIAGNOSIS — E119 Type 2 diabetes mellitus without complications: Secondary | ICD-10-CM | POA: Diagnosis not present

## 2021-09-14 DIAGNOSIS — M79675 Pain in left toe(s): Secondary | ICD-10-CM | POA: Diagnosis not present

## 2021-09-14 DIAGNOSIS — M79676 Pain in unspecified toe(s): Secondary | ICD-10-CM

## 2021-09-14 DIAGNOSIS — B351 Tinea unguium: Secondary | ICD-10-CM

## 2021-09-14 DIAGNOSIS — L84 Corns and callosities: Secondary | ICD-10-CM

## 2021-09-14 DIAGNOSIS — M79674 Pain in right toe(s): Secondary | ICD-10-CM

## 2021-09-17 DIAGNOSIS — M545 Low back pain, unspecified: Secondary | ICD-10-CM | POA: Diagnosis not present

## 2021-09-19 DIAGNOSIS — M545 Low back pain, unspecified: Secondary | ICD-10-CM | POA: Insufficient documentation

## 2021-09-19 NOTE — Progress Notes (Signed)
  Subjective:  Patient ID: Cynthia Torres, female    DOB: August 03, 1945,  MRN: [id number]  Cynthia Torres presents to clinic today for preventative diabetic foot care and corn(s) L 2nd toe and painful thick toenails that are difficult to trim. Painful toenails interfere with ambulation. Aggravating factors include wearing enclosed shoe gear. Pain is relieved with periodic professional debridement. Painful corns are aggravated when weightbearing when wearing enclosed shoe gear. Pain is relieved with periodic professional debridement.  Last A1c was in the 5% range.  Patient did not check blood glucose today.  Patient states she hit her right foot on the side of a table last week. She states she has a bruise that is resolving. Relates no pain and she is able to wiggle her toes and ambulate with no problem. She declines xray on today.  PCP is Lavone Orn, MD , and last visit was April, 2023.  Allergies  Allergen Reactions   Atorvastatin     Other reaction(s): myalgias    Review of Systems: Negative except as noted in the HPI.  ObjectiveFrances D Torres is a pleasant 76 y.o. female in NAD. AAO X 3.  Vascular Examination: CFT <3 seconds b/l LE. Palpable DP/PT pulses b/l LE. Digital hair absent b/l. Skin temperature gradient WNL b/l. No pain with calf compression b/l. No edema noted b/l. No cyanosis or clubbing noted b/l LE.  Dermatological Examination: Pedal integument with normal turgor, texture and tone BLE. No open wounds b/l LE. No interdigital macerations noted b/l LE. Toenails 1-5 b/l elongated, discolored, dystrophic, thickened, crumbly with subungual debris and tenderness to dorsal palpation. Raised hyperkeratotic lesion noted medial aspect left 2nd toe DIPJ. Left lower extremity with +1 edema LLE. No pain on palpation of calf.   Resolving ecchymosis noted dorsally at the base of right 2nd and 3rd digits. Mild edema. No pain on palpation.  Musculoskeletal Examination: Muscle strength  5/5 to all lower extremity muscle groups bilaterally. No pain, crepitus or joint limitation noted with ROM bilateral LE. HAV with bunion deformity noted b/l LE. Hammertoe(s) noted to the L 2nd toe.  No pain on passive/active ROM right 2nd/3rd digits.  Neurological Examination: Protective sensation intact 5/5 intact bilaterally with 10g monofilament b/l. Vibratory sensation intact b/l. Deep tendon reflexes normal b/l.   Assessment/Plan: 1. Pain due to onychomycosis of toenail   2. Corns   3. Diabetes mellitus without complication ([place])     -Examined patient. -Remote h/o hitting right foot on table. Asymptomatic and ecchymosis resolving. Monitor right 2nd/3rd digits. Patient declines xray on today's visit. Return for xray if it become symptomatic. -Continue foot and shoe inspections daily. Monitor blood glucose per PCP/Endocrinologist's recommendations. -Patient to continue soft, supportive shoe gear daily. -Mycotic toenails 1-5 bilaterally were debrided in length and girth with sterile nail nippers and dremel without incident. -Corn(s) L 2nd toe pared utilizing sterile scalpel blade without complication or incident. Total number debrided=1. -Patient/POA to call should there be question/concern in the interim.   Return in about 3 months (around 12/15/2021).  Marzetta Board, DPM

## 2021-10-08 DIAGNOSIS — M2569 Stiffness of other specified joint, not elsewhere classified: Secondary | ICD-10-CM | POA: Diagnosis not present

## 2021-10-08 DIAGNOSIS — M545 Low back pain, unspecified: Secondary | ICD-10-CM | POA: Diagnosis not present

## 2021-10-08 DIAGNOSIS — M62551 Muscle wasting and atrophy, not elsewhere classified, right thigh: Secondary | ICD-10-CM | POA: Diagnosis not present

## 2021-10-08 DIAGNOSIS — M62552 Muscle wasting and atrophy, not elsewhere classified, left thigh: Secondary | ICD-10-CM | POA: Diagnosis not present

## 2021-10-08 DIAGNOSIS — R269 Unspecified abnormalities of gait and mobility: Secondary | ICD-10-CM | POA: Diagnosis not present

## 2021-10-10 DIAGNOSIS — R2681 Unsteadiness on feet: Secondary | ICD-10-CM | POA: Diagnosis not present

## 2021-10-10 DIAGNOSIS — I10 Essential (primary) hypertension: Secondary | ICD-10-CM | POA: Diagnosis not present

## 2021-10-10 DIAGNOSIS — H81399 Other peripheral vertigo, unspecified ear: Secondary | ICD-10-CM | POA: Diagnosis not present

## 2021-10-15 DIAGNOSIS — M62552 Muscle wasting and atrophy, not elsewhere classified, left thigh: Secondary | ICD-10-CM | POA: Diagnosis not present

## 2021-10-15 DIAGNOSIS — M545 Low back pain, unspecified: Secondary | ICD-10-CM | POA: Diagnosis not present

## 2021-10-15 DIAGNOSIS — R269 Unspecified abnormalities of gait and mobility: Secondary | ICD-10-CM | POA: Diagnosis not present

## 2021-10-15 DIAGNOSIS — M2569 Stiffness of other specified joint, not elsewhere classified: Secondary | ICD-10-CM | POA: Diagnosis not present

## 2021-10-15 DIAGNOSIS — M62551 Muscle wasting and atrophy, not elsewhere classified, right thigh: Secondary | ICD-10-CM | POA: Diagnosis not present

## 2021-10-22 DIAGNOSIS — M545 Low back pain, unspecified: Secondary | ICD-10-CM | POA: Diagnosis not present

## 2021-10-22 DIAGNOSIS — M62552 Muscle wasting and atrophy, not elsewhere classified, left thigh: Secondary | ICD-10-CM | POA: Diagnosis not present

## 2021-10-22 DIAGNOSIS — M2569 Stiffness of other specified joint, not elsewhere classified: Secondary | ICD-10-CM | POA: Diagnosis not present

## 2021-10-22 DIAGNOSIS — R269 Unspecified abnormalities of gait and mobility: Secondary | ICD-10-CM | POA: Diagnosis not present

## 2021-10-22 DIAGNOSIS — M62551 Muscle wasting and atrophy, not elsewhere classified, right thigh: Secondary | ICD-10-CM | POA: Diagnosis not present

## 2021-11-14 ENCOUNTER — Other Ambulatory Visit: Payer: Self-pay | Admitting: Internal Medicine

## 2021-11-14 DIAGNOSIS — Z1231 Encounter for screening mammogram for malignant neoplasm of breast: Secondary | ICD-10-CM

## 2021-11-22 DIAGNOSIS — M109 Gout, unspecified: Secondary | ICD-10-CM | POA: Diagnosis not present

## 2021-11-22 DIAGNOSIS — E210 Primary hyperparathyroidism: Secondary | ICD-10-CM | POA: Diagnosis not present

## 2021-11-22 DIAGNOSIS — I10 Essential (primary) hypertension: Secondary | ICD-10-CM | POA: Diagnosis not present

## 2021-11-22 DIAGNOSIS — E119 Type 2 diabetes mellitus without complications: Secondary | ICD-10-CM | POA: Diagnosis not present

## 2021-11-22 DIAGNOSIS — Z23 Encounter for immunization: Secondary | ICD-10-CM | POA: Diagnosis not present

## 2021-11-22 DIAGNOSIS — M48061 Spinal stenosis, lumbar region without neurogenic claudication: Secondary | ICD-10-CM | POA: Diagnosis not present

## 2021-12-04 ENCOUNTER — Ambulatory Visit
Admission: RE | Admit: 2021-12-04 | Discharge: 2021-12-04 | Disposition: A | Discharge status: Home / Self Care | Payer: Medicare Other | Source: Ambulatory Visit | Attending: Internal Medicine | Admitting: Internal Medicine

## 2021-12-04 DIAGNOSIS — Z1231 Encounter for screening mammogram for malignant neoplasm of breast: Secondary | ICD-10-CM

## 2021-12-12 ENCOUNTER — Ambulatory Visit
Admission: RE | Admit: 2021-12-12 | Discharge: 2021-12-12 | Disposition: A | Discharge status: Home / Self Care | Payer: Medicare Other | Source: Ambulatory Visit | Attending: Nurse Practitioner | Admitting: Nurse Practitioner

## 2021-12-12 ENCOUNTER — Other Ambulatory Visit: Payer: Self-pay | Admitting: Nurse Practitioner

## 2021-12-12 DIAGNOSIS — M545 Low back pain, unspecified: Secondary | ICD-10-CM

## 2021-12-12 DIAGNOSIS — M533 Sacrococcygeal disorders, not elsewhere classified: Secondary | ICD-10-CM

## 2021-12-12 DIAGNOSIS — M4316 Spondylolisthesis, lumbar region: Secondary | ICD-10-CM | POA: Diagnosis not present

## 2021-12-12 DIAGNOSIS — M5136 Other intervertebral disc degeneration, lumbar region: Secondary | ICD-10-CM | POA: Diagnosis not present

## 2021-12-28 ENCOUNTER — Encounter: Payer: Self-pay | Admitting: Podiatry

## 2021-12-28 ENCOUNTER — Ambulatory Visit: Payer: Medicare Other | Admitting: Podiatry

## 2021-12-28 DIAGNOSIS — M7989 Other specified soft tissue disorders: Secondary | ICD-10-CM

## 2021-12-28 DIAGNOSIS — B351 Tinea unguium: Secondary | ICD-10-CM | POA: Diagnosis not present

## 2021-12-28 DIAGNOSIS — M79676 Pain in unspecified toe(s): Secondary | ICD-10-CM

## 2021-12-28 DIAGNOSIS — M533 Sacrococcygeal disorders, not elsewhere classified: Secondary | ICD-10-CM | POA: Insufficient documentation

## 2021-12-28 DIAGNOSIS — E119 Type 2 diabetes mellitus without complications: Secondary | ICD-10-CM

## 2021-12-28 NOTE — Progress Notes (Unsigned)
  Subjective:  Patient ID: Cynthia Torres, female    DOB: 02-Apr-1945,  MRN: [id number]  Cynthia Torres presents to clinic today for  Chief Complaint  Patient presents with   Nail Problem    Thick painful toenails, 3 month follow up   . New problem(s): She has had biopsy of left 2nd digit lesion performed by Dr. Blenda Mounts. Since lesion biopsied, it has continued to grow. Today, she states she has been applying Neosporin to lesion once daily.  PCP is Lavone Orn, MD , and last visit was  April 02, 2021.  Allergies  Allergen Reactions   Atorvastatin     Other reaction(s): myalgias    Review of Systems: Negative except as noted in the HPI.  Objective: No changes noted in today's physical examination.  Cynthia Torres is a pleasant 76 y.o. female in NAD. AAO x 3. Vascular Examination: CFT <3 seconds b/l LE. Palpable DP/PT pulses b/l LE. Digital hair absent b/l. Skin temperature gradient WNL b/l. No pain with calf compression b/l. No edema noted b/l. No cyanosis or clubbing noted b/l LE.  Dermatological Examination: Pedal integument with normal turgor, texture and tone BLE. No open wounds b/l LE. No interdigital macerations noted b/l LE. Toenails 1-5 b/l elongated, discolored, dystrophic, thickened, crumbly with subungual debris and tenderness to dorsal palpation.   Raised hyperkeratotic lesion noted medial aspect left 2nd toe DIPJ. Left lower extremity with +1 edema LLE. No pain on palpation of calf.   Resolving ecchymosis noted dorsally at the base of right 2nd and 3rd digits. Mild edema. No pain on palpation.  Musculoskeletal Examination: Muscle strength 5/5 to all lower extremity muscle groups bilaterally. No pain, crepitus or joint limitation noted with ROM bilateral LE. HAV with bunion deformity noted b/l LE. Hammertoe(s) noted to the L 2nd toe.  No pain on passive/active ROM right 2nd/3rd digits.  Neurological Examination: Protective sensation intact 5/5 intact bilaterally  with 10g monofilament b/l. Vibratory sensation intact b/l. Deep tendon reflexes normal b/l.   Assessment/Plan: 1. Pain due to onychomycosis of toenail   2. Mass of soft tissue of foot   3. Diabetes mellitus without complication ([place])     No orders of the defined types were placed in this encounter.   {Jgplan:23602::"-Patient/POA to call should there be question/concern in the interim."}   Return in about 3 months (around 03/30/2022).  Marzetta Board, DPM

## 2022-01-02 ENCOUNTER — Telehealth: Payer: Self-pay | Admitting: Podiatry

## 2022-01-02 NOTE — Telephone Encounter (Signed)
Pt would like to speak with nurse about a cream that Dr. Elisha Ponder recommended for her feet.

## 2022-01-03 NOTE — Telephone Encounter (Signed)
Left voicemail for the patient too call the office back.

## 2022-01-14 DIAGNOSIS — M47816 Spondylosis without myelopathy or radiculopathy, lumbar region: Secondary | ICD-10-CM | POA: Diagnosis not present

## 2022-02-06 DIAGNOSIS — M47816 Spondylosis without myelopathy or radiculopathy, lumbar region: Secondary | ICD-10-CM | POA: Diagnosis not present

## 2022-02-28 ENCOUNTER — Ambulatory Visit: Payer: Medicare Other | Admitting: Podiatry

## 2022-02-28 DIAGNOSIS — L858 Other specified epidermal thickening: Secondary | ICD-10-CM | POA: Diagnosis not present

## 2022-03-03 NOTE — Progress Notes (Signed)
  Subjective:  Patient ID: Cynthia Torres, female    DOB: [birth year],  MRN: [id number]  Chief Complaint  Patient presents with   skin lesion    Dr. Sherryle Lis for pigmented skin lesion left 2nd toe (s/p biopsy by Dr. Blenda Mounts)    76 y.o. female presents with the above complaint. History confirmed with patient.  This was previously biopsied and February of this year.  The results showed it was likely onychomycosis which is odd because it does not arise from the nail plate.  It is still bothering her and occasionally gets painful.  Her diabetes is well-controlled.  He does not know her last A1c but she says she will get me her most recent lab results from her PCP  Objective:  Physical Exam: warm, good capillary refill, no trophic changes or ulcerative lesions, normal DP and PT pulses, normal monofilament exam, normal sensory exam, and left second toe shows a small hyperkeratotic painful nail like lesion proximal to the nail fold over the medial DIPJ  Assessment:   1. Cutaneous horn      Plan:  Patient was evaluated and treated and all questions answered.  I discussed with her I suspect this is likely cutaneous horn or abnormal formation of the nail matrix proximal to the nailbed.  Biopsy has been completed before and did not show any malignancy.  I recommended excisional of the lesion if it continues to bother her which it does.  We discussed the risk and benefits of this including but limited to  pain, swelling, infection, scar, numbness which may be temporary or permanent, chronic pain, stiffness, nerve pain or damage, wound healing problems.  She understands and wishes to proceed.  Informed consent was signed and reviewed.  All questions addressed.  She will get me her most recent lab results I discussed with her an A1c of 7.5% or higher would not be amenable to surgery.  Surgery be scheduled as an outpatient   Surgical plan:  Procedure: -Excision benign skin lesion left second  toe  Location: -[facility]  Anesthesia plan: -IV sedation with local  Postoperative pain plan: - Tylenol 1000 mg every 6 hours, tramadol 50 mg every 6 hours as needed  DVT prophylaxis: -None required  WB Restrictions / DME needs: -WBAT in surgical shoe to be dispensed at surgery   No follow-ups on file.

## 2022-03-15 ENCOUNTER — Telehealth: Payer: Self-pay | Admitting: Podiatry

## 2022-03-15 NOTE — Telephone Encounter (Signed)
DOS: 04/15/2022  [employer] Medicare Effective 03/04/2022  Exc. Benign Lesion 1.0 cm Lt (36438)  Deductible: $0 Out-of-Pocket: $3,600 with $0 met CoInsurance: 0%  Notification or Prior Authorization is not required for the requested services  Decision ID #:[id number]  The number above acknowledges your inquiry and our response. Please write this number down and refer to it for future inquiries. Coverage and payment for an item or service is governed by the member's benefit plan document, and, if applicable, the provider's participation agreement with the Health Plan.

## 2022-04-08 DIAGNOSIS — M47816 Spondylosis without myelopathy or radiculopathy, lumbar region: Secondary | ICD-10-CM | POA: Diagnosis not present

## 2022-04-23 ENCOUNTER — Encounter: Payer: Medicare Other | Admitting: Podiatry

## 2022-04-26 ENCOUNTER — Encounter: Payer: Self-pay | Admitting: Podiatry

## 2022-04-26 ENCOUNTER — Ambulatory Visit: Payer: Medicare Other | Admitting: Podiatry

## 2022-04-26 VITALS — BP 169/98

## 2022-04-26 DIAGNOSIS — M2012 Hallux valgus (acquired), left foot: Secondary | ICD-10-CM | POA: Diagnosis not present

## 2022-04-26 DIAGNOSIS — B351 Tinea unguium: Secondary | ICD-10-CM

## 2022-04-26 DIAGNOSIS — M2011 Hallux valgus (acquired), right foot: Secondary | ICD-10-CM | POA: Diagnosis not present

## 2022-04-26 DIAGNOSIS — E119 Type 2 diabetes mellitus without complications: Secondary | ICD-10-CM

## 2022-04-26 DIAGNOSIS — M2041 Other hammer toe(s) (acquired), right foot: Secondary | ICD-10-CM

## 2022-04-26 DIAGNOSIS — L858 Other specified epidermal thickening: Secondary | ICD-10-CM

## 2022-04-26 DIAGNOSIS — M79676 Pain in unspecified toe(s): Secondary | ICD-10-CM | POA: Diagnosis not present

## 2022-04-26 DIAGNOSIS — M2042 Other hammer toe(s) (acquired), left foot: Secondary | ICD-10-CM

## 2022-04-26 NOTE — Progress Notes (Signed)
ANNUAL DIABETIC FOOT EXAM  Subjective: Cynthia Torres presents today for annual diabetic foot examination.  Chief Complaint  Patient presents with   Nail Problem    [facility] BS-did not check A1C-did not know PCP [facility]-have not met her yet   Patient confirms h/o diabetes.  Patient relates 30 year h/o diabetes.  Patient denies any h/o foot wounds.  Patient denies any numbness, tingling, burning, or pins/needle sensation in feet.  Patient states she would like to postpone her toe procedure due to currently undergoing treatment for her back.  Risk factors: diabetes, history of gout, HTN, hypercholesterolemia.  Thana Ates, MD is patient's PCP.   Past Medical History:  Diagnosis Date   Diabetes mellitus without complication (HCC)    Gout    HTN (hypertension)    Hyperlipidemia    Patient Active Problem List   Diagnosis Date Noted   Sacroiliac joint pain 12/28/2021   Low back pain, unspecified 09/19/2021   Body mass index (BMI) 40.0-44.9, adult (HCC) 06/12/2021   Essential hypertension 06/12/2021   Gastroesophageal reflux disease 06/12/2021   Gout 06/12/2021   Hypercalcemia 06/12/2021   Hypercholesterolemia 06/12/2021   Irritable bowel syndrome with constipation 06/12/2021   Lumbar spinal stenosis 06/12/2021   Morbid obesity (HCC) 06/12/2021   Osteoarthritis 06/12/2021   Primary insomnia 06/12/2021   Type 2 diabetes mellitus with other specified complication (HCC) 06/12/2021   Primary hyperparathyroidism (HCC) 06/12/2021   Chronic pain 08/24/2020   Myofascial pain 08/24/2020   Sacroiliitis, not elsewhere classified (HCC) 08/24/2020   Sciatica 08/24/2020   Acquired spondylolisthesis 05/05/2020   Displacement of lumbar intervertebral disc without myelopathy 05/05/2020   Acquired ptosis of eyelid of both eyes 09/03/2019   Primary osteoarthritis of first carpometacarpal joint of right hand 05/21/2019   Trigger middle finger of right hand 05/21/2019   Cranial neuritis  02/23/2018   History reviewed. No pertinent surgical history. Current Outpatient Medications on File Prior to Visit  Medication Sig Dispense Refill   allopurinol (ZYLOPRIM) 300 MG tablet      allopurinol (ZYLOPRIM) 300 MG tablet Take 1 tablet by mouth daily.     amLODipine (NORVASC) 5 MG tablet Take 5 mg by mouth daily.     amLODipine (NORVASC) 5 MG tablet Take 1 tablet by mouth 2 (two) times daily.     atenolol (TENORMIN) 100 MG tablet Take 100 mg by mouth daily.     atorvastatin (LIPITOR) 40 MG tablet Take 40 mg by mouth daily.     CELEBREX 100 MG capsule Take 100 mg by mouth 2 (two) times daily.     fosinopril (MONOPRIL) 40 MG tablet Take 40 mg by mouth daily.     furosemide (LASIX) 20 MG tablet 1 tablet     gabapentin (NEURONTIN) 100 MG capsule 3 capsules     HYDROcodone-acetaminophen (NORCO/VICODIN) 5-325 MG tablet Take 1 tablet by mouth every 6 (six) hours as needed for moderate pain. 30 tablet 0   hydrocortisone 1 % ointment Apply 1 application topically 2 (two) times daily. 30 g 0   KENALOG 40 MG/ML injection Inject 1 mL into the muscle once.     ketorolac (TORADOL) 60 MG/2ML SOLN injection Inject 60 mg into the muscle once.     lisinopril-hydrochlorothiazide (PRINZIDE,ZESTORETIC) 20-25 MG tablet      MEDROL 4 MG TBPK tablet Take as directed on pack over 6 days     meloxicam (MOBIC) 15 MG tablet Take 1 tablet (15 mg total) by mouth daily. 30 tablet 0  metFORMIN (GLUCOPHAGE) 500 MG tablet Take by mouth 2 (two) times daily with a meal.     metFORMIN (GLUCOPHAGE-XR) 500 MG 24 hr tablet Take 1,000 mg by mouth 2 (two) times daily.     methocarbamol (ROBAXIN) 500 MG tablet Take 1,000 mg by mouth 2 (two) times daily.     methylPREDNISolone acetate (DEPO-MEDROL) 40 MG/ML injection Inject 80 mg into the muscle once.     moxifloxacin (VIGAMOX) 0.5 % ophthalmic solution Place 1 drop into the right eye 4 (four) times daily.     neomycin-polymyxin b-dexamethasone (MAXITROL) 3.5-10000-0.1 OINT       NONFORMULARY OR COMPOUNDED ITEM Apply 1-2 g topically 4 (four) times daily. 120 each 2   ONETOUCH VERIO test strip      potassium chloride (MICRO-K) 10 MEQ CR capsule Take by mouth 2 (two) times daily.     prednisoLONE acetate (PRED FORTE) 1 % ophthalmic suspension      PROLENSA 0.07 % SOLN Place 1 drop into the right eye daily.     rosuvastatin (CRESTOR) 10 MG tablet Take 5 mg by mouth once.     rosuvastatin (CRESTOR) 5 MG tablet      Current Facility-Administered Medications on File Prior to Visit  Medication Dose Route Frequency Provider Last Rate Last Admin   betamethasone acetate-betamethasone sodium phosphate (CELESTONE) injection 3 mg  3 mg Intramuscular Once Felecia Shelling, DPM        Allergies  Allergen Reactions   Atorvastatin     Other reaction(s): myalgias   Social History   Occupational History   Not on file  Tobacco Use   Smoking status: Never   Smokeless tobacco: Never  Substance and Sexual Activity   Alcohol use: No    Alcohol/week: 0.0 standard drinks of alcohol   Drug use: No   Sexual activity: Not on file   Family History  Problem Relation Age of Onset   Breast cancer Neg Hx    Immunization History  Administered Date(s) Administered   Influenza, High Dose Seasonal PF 12/17/2017   PFIZER(Purple Top)SARS-COV-2 Vaccination 05/09/2019, 06/08/2019     Review of Systems: Negative except as noted in the HPI.   Objective: Vitals:   04/26/22 0751  BP: (!) 169/98   Cynthia Torres is a pleasant 77 y.o. female in NAD. AAO X 3.  Vascular Examination: Capillary refill time immediate b/l. Vascular status intact b/l with palpable pedal pulses. Pedal hair present b/l. No pain with calf compression b/l. Skin temperature gradient WNL b/l. Nonpitting edema noted bilateral ankles.  Neurological Examination: Sensation grossly intact b/l with 10 gram monofilament. Vibratory sensation intact b/l.   Dermatological Examination: Pedal skin with normal turgor,  texture and tone b/l.  No open wounds. No interdigital macerations.   Toenails 1-5 b/l thick, discolored, elongated with subungual debris and pain on dorsal palpation.   Pedal skin is warm and supple b/l LE. No open wounds b/l LE. No interdigital macerations noted b/l LE. Toenails 1-5 bilaterally elongated, discolored, dystrophic, thickened, and crumbly with subungual debris and tenderness to dorsal palpation. Raised hyperkeratotic lesion noted dorsomedial IPJ of left 2nd digit. Firm, fixed and solid. No erythema, no edema, no drainage, no fluctuance.  Musculoskeletal Examination: Normal muscle strength 5/5 to all lower extremity muscle groups bilaterally. HAV with bunion deformity noted b/l LE. Hammertoe(s) noted to the left second digit.Marland Kitchen No pain, crepitus or joint limitation noted with ROM b/l LE.  Patient ambulates independently without assistive aids.  Radiographs: None  Footwear  Assessment: Does the patient wear appropriate shoes? Yes. Does the patient need inserts/orthotics? No.  ADA Risk Categorization: Low Risk :  Patient has all of the following: Intact protective sensation No prior foot ulcer  No severe deformity Pedal pulses present  Assessment: 1. Pain due to onychomycosis of toenail   2. Cutaneous horn   3. Hallux valgus, acquired, bilateral   4. Acquired hammertoes of both feet   5. Diabetes mellitus without complication (HCC)   6. Encounter for diabetic foot exam [facility])     Plan: -Patient was evaluated and treated. All patient's and/or POA's questions/concerns answered on today's visit. -Patient desires to postpone further workup of cutaneous horn at this time. She would like to concentrate on her back issues for now . -Diabetic foot examination performed today. -Continue diabetic foot care principles: inspect feet daily, monitor glucose as recommended by PCP and/or Endocrinologist, and follow prescribed diet per PCP, Endocrinologist and/or dietician. -Mycotic  toenails 1-5 bilaterally were debrided in length and girth with sterile nail nippers and dremel without incident. -Dispensed tube foam. Apply to L 2nd toe every morning. Remove every evening. -Patient/POA to call should there be question/concern in the interim. Return in about 3 months (around 07/25/2022).  Freddie Breech, DPM

## 2022-05-07 ENCOUNTER — Other Ambulatory Visit: Payer: Medicare Other

## 2022-05-09 DIAGNOSIS — M47816 Spondylosis without myelopathy or radiculopathy, lumbar region: Secondary | ICD-10-CM | POA: Diagnosis not present

## 2022-05-20 ENCOUNTER — Telehealth: Payer: Self-pay | Admitting: Urology

## 2022-05-20 NOTE — Telephone Encounter (Signed)
DOS - 06/14/22  EXC BENIGN LESION LEFT --- 11421  [employer]   PER [employer] WEBSITE FOR CPT CODE 29562 Notification or Prior Authorization is not required for the requested services   Decision ID #: [id number]

## 2022-05-23 DIAGNOSIS — Z79899 Other long term (current) drug therapy: Secondary | ICD-10-CM | POA: Diagnosis not present

## 2022-05-23 DIAGNOSIS — Z0000 Encounter for general adult medical examination without abnormal findings: Secondary | ICD-10-CM | POA: Diagnosis not present

## 2022-05-23 DIAGNOSIS — M85839 Other specified disorders of bone density and structure, unspecified forearm: Secondary | ICD-10-CM | POA: Diagnosis not present

## 2022-05-23 DIAGNOSIS — M109 Gout, unspecified: Secondary | ICD-10-CM | POA: Diagnosis not present

## 2022-05-23 DIAGNOSIS — E559 Vitamin D deficiency, unspecified: Secondary | ICD-10-CM | POA: Diagnosis not present

## 2022-05-23 DIAGNOSIS — E119 Type 2 diabetes mellitus without complications: Secondary | ICD-10-CM | POA: Diagnosis not present

## 2022-05-23 DIAGNOSIS — E7800 Pure hypercholesterolemia, unspecified: Secondary | ICD-10-CM | POA: Diagnosis not present

## 2022-05-23 DIAGNOSIS — K219 Gastro-esophageal reflux disease without esophagitis: Secondary | ICD-10-CM | POA: Diagnosis not present

## 2022-05-23 DIAGNOSIS — M85832 Other specified disorders of bone density and structure, left forearm: Secondary | ICD-10-CM | POA: Diagnosis not present

## 2022-05-23 DIAGNOSIS — I10 Essential (primary) hypertension: Secondary | ICD-10-CM | POA: Diagnosis not present

## 2022-05-23 DIAGNOSIS — E210 Primary hyperparathyroidism: Secondary | ICD-10-CM | POA: Diagnosis not present

## 2022-05-23 DIAGNOSIS — M48061 Spinal stenosis, lumbar region without neurogenic claudication: Secondary | ICD-10-CM | POA: Diagnosis not present

## 2022-05-28 ENCOUNTER — Encounter: Payer: Medicare Other | Admitting: Podiatry

## 2022-06-03 DIAGNOSIS — M7062 Trochanteric bursitis, left hip: Secondary | ICD-10-CM | POA: Diagnosis not present

## 2022-06-03 DIAGNOSIS — G8929 Other chronic pain: Secondary | ICD-10-CM | POA: Diagnosis not present

## 2022-06-10 DIAGNOSIS — J029 Acute pharyngitis, unspecified: Secondary | ICD-10-CM | POA: Diagnosis not present

## 2022-06-10 DIAGNOSIS — Z03818 Encounter for observation for suspected exposure to other biological agents ruled out: Secondary | ICD-10-CM | POA: Diagnosis not present

## 2022-06-13 ENCOUNTER — Telehealth: Payer: Self-pay | Admitting: Podiatry

## 2022-06-13 NOTE — Telephone Encounter (Signed)
Cynthia Torres called to cancel her surgery with Dr. Lilian Kapur on 06/14/2022. She stated her foot is much better. Notified Dr. Lilian Kapur and Aram Beecham at [facility]

## 2022-06-14 ENCOUNTER — Other Ambulatory Visit: Payer: Self-pay | Admitting: Nurse Practitioner

## 2022-06-14 ENCOUNTER — Ambulatory Visit
Admission: RE | Admit: 2022-06-14 | Discharge: 2022-06-14 | Disposition: A | Discharge status: Home / Self Care | Payer: Medicare Other | Source: Ambulatory Visit | Attending: Nurse Practitioner | Admitting: Nurse Practitioner

## 2022-06-14 DIAGNOSIS — M25552 Pain in left hip: Secondary | ICD-10-CM

## 2022-06-20 ENCOUNTER — Encounter: Payer: Medicare Other | Admitting: Podiatry

## 2022-06-27 DIAGNOSIS — M7062 Trochanteric bursitis, left hip: Secondary | ICD-10-CM | POA: Diagnosis not present

## 2022-07-04 ENCOUNTER — Encounter: Payer: Medicare Other | Admitting: Podiatry

## 2022-07-12 DIAGNOSIS — H353132 Nonexudative age-related macular degeneration, bilateral, intermediate dry stage: Secondary | ICD-10-CM | POA: Diagnosis not present

## 2022-07-12 DIAGNOSIS — H16231 Neurotrophic keratoconjunctivitis, right eye: Secondary | ICD-10-CM | POA: Diagnosis not present

## 2022-07-12 DIAGNOSIS — H524 Presbyopia: Secondary | ICD-10-CM | POA: Diagnosis not present

## 2022-07-12 DIAGNOSIS — H5212 Myopia, left eye: Secondary | ICD-10-CM | POA: Diagnosis not present

## 2022-07-12 DIAGNOSIS — E119 Type 2 diabetes mellitus without complications: Secondary | ICD-10-CM | POA: Diagnosis not present

## 2022-07-12 DIAGNOSIS — H4901 Third [oculomotor] nerve palsy, right eye: Secondary | ICD-10-CM | POA: Diagnosis not present

## 2022-07-16 DIAGNOSIS — G8929 Other chronic pain: Secondary | ICD-10-CM | POA: Diagnosis not present

## 2022-07-16 DIAGNOSIS — M7062 Trochanteric bursitis, left hip: Secondary | ICD-10-CM | POA: Diagnosis not present

## 2022-07-24 DIAGNOSIS — G8929 Other chronic pain: Secondary | ICD-10-CM | POA: Diagnosis not present

## 2022-07-24 DIAGNOSIS — M7062 Trochanteric bursitis, left hip: Secondary | ICD-10-CM | POA: Diagnosis not present

## 2022-07-24 DIAGNOSIS — Z79891 Long term (current) use of opiate analgesic: Secondary | ICD-10-CM | POA: Diagnosis not present

## 2022-07-25 ENCOUNTER — Encounter: Payer: Medicare Other | Admitting: Podiatry

## 2022-08-05 DIAGNOSIS — Z79891 Long term (current) use of opiate analgesic: Secondary | ICD-10-CM | POA: Diagnosis not present

## 2022-08-14 ENCOUNTER — Ambulatory Visit: Payer: Medicare Other | Admitting: Podiatry

## 2022-08-14 DIAGNOSIS — L84 Corns and callosities: Secondary | ICD-10-CM

## 2022-08-14 DIAGNOSIS — E119 Type 2 diabetes mellitus without complications: Secondary | ICD-10-CM

## 2022-08-14 DIAGNOSIS — M79676 Pain in unspecified toe(s): Secondary | ICD-10-CM | POA: Diagnosis not present

## 2022-08-14 DIAGNOSIS — B351 Tinea unguium: Secondary | ICD-10-CM | POA: Diagnosis not present

## 2022-08-18 ENCOUNTER — Encounter: Payer: Self-pay | Admitting: Podiatry

## 2022-08-18 NOTE — Progress Notes (Signed)
  Subjective:  Patient ID: Cynthia Torres, female    DOB: 04/13/1945,  MRN: [id number]  Cynthia Torres presents to clinic today for preventative diabetic foot care and corn(s) L 2nd toe and painful thick toenails that are difficult to trim. Painful toenails interfere with ambulation. Aggravating factors include wearing enclosed shoe gear. Pain is relieved with periodic professional debridement. Painful corns are aggravated when weightbearing when wearing enclosed shoe gear. Pain is relieved with periodic professional debridement.  Chief Complaint  Patient presents with   Diabetes    DFC BS - DIDN'T CHECK IT A1C - DK  [facility]P - 07/2022   New problem(s): None.   PCP is Thana Ates, MD.  Allergies  Allergen Reactions   Atorvastatin     Other reaction(s): myalgias    Review of Systems: Negative except as noted in the HPI.  Objective: No changes noted in today's physical examination. There were no vitals filed for this visit. Cynthia Torres is a pleasant 77 y.o. female in NAD. AAO x 3.  Vascular Examination: Capillary refill time immediate b/l. Vascular status intact b/l with palpable pedal pulses. Pedal hair present b/l. No pain with calf compression b/l. Skin temperature gradient WNL b/l. No cyanosis or clubbing b/l. No ischemia or gangrene noted b/l.   Neurological Examination: Sensation grossly intact b/l with 10 gram monofilament. Vibratory sensation intact b/l.   Dermatological Examination: Pedal skin with normal turgor, texture and tone b/l.  No open wounds. No interdigital macerations.   Toenails 1-5 b/l thick, discolored, elongated with subungual debris and pain on dorsal palpation.   Hyperkeratotic lesion(s) L 2nd toe.  No erythema, no edema, no drainage, no fluctuance.  Musculoskeletal Examination: Muscle strength 5/5 to all lower extremity muscle groups bilaterally. HAV with bunion deformity noted b/l LE. Hammertoe(s) noted to the L 2nd toe.  Assessment/Plan: 1.  Pain due to onychomycosis of toenail   2. Corns   3. Diabetes mellitus without complication (HCC)    -Consent given for treatment as described below: -Examined patient. -Patient to continue soft, supportive shoe gear daily. -Toenails 1-5 b/l were debrided in length and girth with sterile nail nippers and dremel without iatrogenic bleeding.  -Continue padding to afftected digits daily for protection. -Patient/POA to call should there be question/concern in the interim.   Return in about 3 months (around 11/14/2022).  Freddie Breech, DPM

## 2022-08-21 ENCOUNTER — Other Ambulatory Visit: Payer: Self-pay | Admitting: Nurse Practitioner

## 2022-08-21 DIAGNOSIS — Z79891 Long term (current) use of opiate analgesic: Secondary | ICD-10-CM | POA: Diagnosis not present

## 2022-08-21 DIAGNOSIS — M47816 Spondylosis without myelopathy or radiculopathy, lumbar region: Secondary | ICD-10-CM | POA: Diagnosis not present

## 2022-08-27 DIAGNOSIS — E559 Vitamin D deficiency, unspecified: Secondary | ICD-10-CM | POA: Diagnosis not present

## 2022-09-14 ENCOUNTER — Ambulatory Visit
Admission: RE | Admit: 2022-09-14 | Discharge: 2022-09-14 | Disposition: A | Discharge status: Home / Self Care | Payer: Medicare Other | Source: Ambulatory Visit | Attending: Nurse Practitioner | Admitting: Nurse Practitioner

## 2022-09-14 DIAGNOSIS — M48061 Spinal stenosis, lumbar region without neurogenic claudication: Secondary | ICD-10-CM | POA: Diagnosis not present

## 2022-09-14 DIAGNOSIS — M47816 Spondylosis without myelopathy or radiculopathy, lumbar region: Secondary | ICD-10-CM

## 2022-09-20 ENCOUNTER — Other Ambulatory Visit: Payer: Medicare Other

## 2022-10-01 DIAGNOSIS — G8929 Other chronic pain: Secondary | ICD-10-CM | POA: Diagnosis not present

## 2022-10-01 DIAGNOSIS — Z79891 Long term (current) use of opiate analgesic: Secondary | ICD-10-CM | POA: Diagnosis not present

## 2022-10-01 DIAGNOSIS — M545 Low back pain, unspecified: Secondary | ICD-10-CM | POA: Diagnosis not present

## 2022-10-01 DIAGNOSIS — M48061 Spinal stenosis, lumbar region without neurogenic claudication: Secondary | ICD-10-CM | POA: Diagnosis not present

## 2022-10-21 ENCOUNTER — Emergency Department (HOSPITAL_COMMUNITY): Payer: Medicare Other

## 2022-10-21 ENCOUNTER — Other Ambulatory Visit: Payer: Self-pay

## 2022-10-21 ENCOUNTER — Encounter (HOSPITAL_COMMUNITY): Payer: Self-pay

## 2022-10-21 ENCOUNTER — Emergency Department (HOSPITAL_COMMUNITY)
Admission: EM | Admit: 2022-10-21 | Discharge: 2022-10-21 | Disposition: A | Discharge status: Home / Self Care | Payer: Medicare Other | Attending: Emergency Medicine | Admitting: Emergency Medicine

## 2022-10-21 DIAGNOSIS — R42 Dizziness and giddiness: Secondary | ICD-10-CM | POA: Insufficient documentation

## 2022-10-21 DIAGNOSIS — Z79899 Other long term (current) drug therapy: Secondary | ICD-10-CM | POA: Insufficient documentation

## 2022-10-21 DIAGNOSIS — I517 Cardiomegaly: Secondary | ICD-10-CM | POA: Insufficient documentation

## 2022-10-21 DIAGNOSIS — S4992XA Unspecified injury of left shoulder and upper arm, initial encounter: Secondary | ICD-10-CM | POA: Diagnosis not present

## 2022-10-21 DIAGNOSIS — Q670 Congenital facial asymmetry: Secondary | ICD-10-CM | POA: Diagnosis not present

## 2022-10-21 DIAGNOSIS — R29818 Other symptoms and signs involving the nervous system: Secondary | ICD-10-CM | POA: Diagnosis not present

## 2022-10-21 DIAGNOSIS — W19XXXA Unspecified fall, initial encounter: Secondary | ICD-10-CM | POA: Diagnosis not present

## 2022-10-21 DIAGNOSIS — I10 Essential (primary) hypertension: Secondary | ICD-10-CM | POA: Insufficient documentation

## 2022-10-21 DIAGNOSIS — S0990XA Unspecified injury of head, initial encounter: Secondary | ICD-10-CM | POA: Diagnosis not present

## 2022-10-21 DIAGNOSIS — E7800 Pure hypercholesterolemia, unspecified: Secondary | ICD-10-CM | POA: Diagnosis not present

## 2022-10-21 DIAGNOSIS — I252 Old myocardial infarction: Secondary | ICD-10-CM | POA: Diagnosis not present

## 2022-10-21 DIAGNOSIS — S79912A Unspecified injury of left hip, initial encounter: Secondary | ICD-10-CM | POA: Diagnosis not present

## 2022-10-21 LAB — DIFFERENTIAL
Abs Immature Granulocytes: 0.04 10*3/uL (ref 0.00–0.07)
Basophils Absolute: 0.1 10*3/uL (ref 0.0–0.1)
Basophils Relative: 1 %
Eosinophils Absolute: 0.1 10*3/uL (ref 0.0–0.5)
Eosinophils Relative: 1 %
Immature Granulocytes: 0 %
Lymphocytes Relative: 10 %
Lymphs Abs: 0.9 10*3/uL (ref 0.7–4.0)
Monocytes Absolute: 0.6 10*3/uL (ref 0.1–1.0)
Monocytes Relative: 7 %
Neutro Abs: 7.5 10*3/uL (ref 1.7–7.7)
Neutrophils Relative %: 81 %

## 2022-10-21 LAB — CBC
HCT: 38.7 % (ref 36.0–46.0)
Hemoglobin: 12.6 g/dL (ref 12.0–15.0)
MCH: 29.8 pg (ref 26.0–34.0)
MCHC: 32.6 g/dL (ref 30.0–36.0)
MCV: 91.5 fL (ref 80.0–100.0)
Platelets: 257 10*3/uL (ref 150–400)
RBC: 4.23 MIL/uL (ref 3.87–5.11)
RDW: 12.6 % (ref 11.5–15.5)
WBC: 9.2 10*3/uL (ref 4.0–10.5)
nRBC: 0 % (ref 0.0–0.2)

## 2022-10-21 LAB — BASIC METABOLIC PANEL
Anion gap: 12 (ref 5–15)
BUN: 13 mg/dL (ref 8–23)
CO2: 23 mmol/L (ref 22–32)
Calcium: 11.4 mg/dL — ABNORMAL HIGH (ref 8.9–10.3)
Chloride: 104 mmol/L (ref 98–111)
Creatinine, Ser: 0.58 mg/dL (ref 0.44–1.00)
GFR, Estimated: >60 mL/min (ref >60)
Glucose, Bld: 159 mg/dL — ABNORMAL HIGH (ref 70–99)
Potassium: 3.7 mmol/L (ref 3.5–5.1)
Sodium: 139 mmol/L (ref 135–145)

## 2022-10-21 LAB — URINALYSIS, ROUTINE W REFLEX MICROSCOPIC
Bacteria, UA: NONE SEEN
Bilirubin Urine: NEGATIVE
Glucose, UA: NEGATIVE mg/dL
Hgb urine dipstick: NEGATIVE
Ketones, ur: NEGATIVE mg/dL
Nitrite: NEGATIVE
Protein, ur: NEGATIVE mg/dL
Specific Gravity, Urine: 1.008 (ref 1.005–1.030)
pH: 7 (ref 5.0–8.0)

## 2022-10-21 LAB — ETHANOL: Alcohol, Ethyl (B): <10 mg/dL (ref <10)

## 2022-10-21 LAB — APTT: aPTT: 30 seconds (ref 24–36)

## 2022-10-21 LAB — CBG MONITORING, ED: Glucose-Capillary: 135 mg/dL — ABNORMAL HIGH (ref 70–99)

## 2022-10-21 LAB — RAPID URINE DRUG SCREEN, HOSP PERFORMED
Amphetamines: NOT DETECTED
Barbiturates: NOT DETECTED
Benzodiazepines: NOT DETECTED
Cocaine: NOT DETECTED
Opiates: NOT DETECTED
Tetrahydrocannabinol: NOT DETECTED

## 2022-10-21 LAB — PROTIME-INR
INR: 1 (ref 0.8–1.2)
Prothrombin Time: 13.2 seconds (ref 11.4–15.2)

## 2022-10-21 MED ORDER — MECLIZINE HCL 25 MG PO TABS: 25.0000 mg | ORAL_TABLET | Freq: Three times a day (TID) | ORAL | 0 refills | Status: DC | PRN

## 2022-10-21 MED ORDER — ONDANSETRON HCL 4 MG PO TABS: 4.0000 mg | ORAL_TABLET | Freq: Four times a day (QID) | ORAL | 0 refills | Status: DC

## 2022-10-21 MED ORDER — SODIUM CHLORIDE 0.9 % IV SOLN
100.0000 mL/h | INTRAVENOUS | Status: DC
  Administered 2022-10-21: 100 mL/h via INTRAVENOUS

## 2022-10-21 MED ORDER — SODIUM CHLORIDE 0.9 % IV BOLUS
500.0000 mL | Freq: Once | INTRAVENOUS | Status: AC
  Administered 2022-10-21: 500 mL via INTRAVENOUS

## 2022-10-21 NOTE — ED Notes (Signed)
Unable to give urine sample at this time.  

## 2022-10-21 NOTE — ED Provider Notes (Signed)
Patient seen after prior ED provider.  Patient is comfortable.  She desires discharge home.   All questions answered.  Importance of close follow-up stressed.  Strict return precautions given and understood.     Wynetta Fines, MD 10/21/22 [phone]

## 2022-10-21 NOTE — Discharge Instructions (Signed)
Return for any problem.  ?

## 2022-10-21 NOTE — ED Triage Notes (Addendum)
Patient woke up feeling dizzy and fell on her way to the bathroom. Hit the left side of her head, left arm, left hip. Not on a blood thinner. Feels dizzy at rest and nauseous.

## 2022-10-21 NOTE — ED Provider Notes (Signed)
[place] EMERGENCY DEPARTMENT AT [facility] Provider Note   CSN: [id number] Arrival date & time: 10/21/22  1206     History {Add pertinent medical, surgical, social history, OB history to HPI:1} Chief Complaint  Patient presents with   Dizziness    Cynthia Torres is a 77 y.o. female.  HPI Patient presents with family members to assist with the history.  She presents with concern for dizziness that began yesterday about 18 hours prior to my evaluation.  Initially she only describes dizziness, without weakness, but on discussing patient's possible changes from baseline patient seemingly has facial asymmetry with decreased right facial tone as well as slurred speech which I do not appreciate but family members note is a concern.  Patient is unaware of when those may have occurred. She denies focal pain, discomfort, states that she has been taking her medication as directed.    Home Medications Prior to Admission medications   Medication Sig Start Date End Date Taking? Authorizing Provider  allopurinol (ZYLOPRIM) 300 MG tablet  03/05/18   [provider]  allopurinol (ZYLOPRIM) 300 MG tablet Take 1 tablet by mouth daily.    [provider]  amLODipine (NORVASC) 5 MG tablet Take 5 mg by mouth daily.    [provider]  amLODipine (NORVASC) 5 MG tablet Take 1 tablet by mouth 2 (two) times daily.    [provider]  atenolol (TENORMIN) 100 MG tablet Take 100 mg by mouth daily.    [provider]  atorvastatin (LIPITOR) 40 MG tablet Take 40 mg by mouth daily.    [provider]  CELEBREX 100 MG capsule Take 100 mg by mouth 2 (two) times daily. 04/26/21   [provider]  fosinopril (MONOPRIL) 40 MG tablet Take 40 mg by mouth daily.    [provider]  furosemide (LASIX) 20 MG tablet 1 tablet 11/15/16   [provider]  gabapentin (NEURONTIN) 100 MG capsule 3 capsules 05/16/21   [provider]  HYDROcodone-acetaminophen (NORCO/VICODIN) 5-325 MG tablet Take 1 tablet by mouth every 6 (six) hours as needed for moderate pain. 12/29/15   Helane Gunther, DPM  hydrocortisone 1 % ointment Apply 1 application topically 2 (two) times daily. 09/25/18   Freddie Breech, DPM  KENALOG 40 MG/ML injection Inject 1 mL into the muscle once. 04/10/21   [provider]  ketorolac (TORADOL) 60 MG/2ML SOLN injection Inject 60 mg into the muscle once. 04/10/21   [provider]  lisinopril-hydrochlorothiazide (PRINZIDE,ZESTORETIC) 20-25 MG tablet  03/17/18   [provider]  MEDROL 4 MG TBPK tablet Take as directed on pack over 6 days 07/05/19   [provider]  meloxicam (MOBIC) 15 MG tablet Take 1 tablet (15 mg total) by mouth daily. 12/29/15   Helane Gunther, DPM  metFORMIN (GLUCOPHAGE) 500 MG tablet Take by mouth 2 (two) times daily with a meal.    [provider]  metFORMIN (GLUCOPHAGE-XR) 500 MG 24 hr tablet Take 1,000 mg by mouth 2 (two) times daily. 10/07/19   [provider]  methocarbamol (ROBAXIN) 500 MG tablet Take 1,000 mg by mouth 2 (two) times daily. 08/03/21   [provider]  methylPREDNISolone acetate (DEPO-MEDROL) 40 MG/ML injection Inject 80 mg into the muscle once. 07/05/19   [provider]  moxifloxacin (VIGAMOX) 0.5 % ophthalmic solution Place 1 drop into the right eye 4 (four) times daily. 03/16/19   [provider]  neomycin-polymyxin b-dexamethasone (MAXITROL) 3.5-10000-0.1 OINT  09/25/20   [provider]  NONFORMULARY OR COMPOUNDED ITEM Apply 1-2 g topically 4 (four) times daily. 01/03/16   Felecia Shelling, DPM  ONETOUCH VERIO test strip  05/08/18   [provider]  potassium chloride (MICRO-K) 10 MEQ CR capsule Take by mouth 2 (two) times daily. 01/20/20   [provider]  prednisoLONE acetate (PRED FORTE) 1 % ophthalmic suspension  03/16/19   [provider]  PROLENSA 0.07 %  SOLN Place 1 drop into the right eye daily. 03/16/19   [provider]  rosuvastatin (CRESTOR) 10 MG tablet Take 5 mg by mouth once. 05/04/20   [provider]  rosuvastatin (CRESTOR) 5 MG tablet  08/20/18   [provider]      Allergies    Atorvastatin    Review of Systems   Review of Systems  All other systems reviewed and are negative.   Physical Exam Updated Vital Signs BP (!) 175/63   Pulse 63   Temp 97.8 F (36.6 C) (Oral)   Resp 18   Ht 4\' 11"  (1.499 m)   Wt 79.4 kg   SpO2 100%   BMI 35.35 kg/m  Physical Exam Vitals and nursing note reviewed.  Constitutional:      General: She is not in acute distress.    Appearance: She is well-developed.  HENT:     Head: Normocephalic and atraumatic.  Eyes:     Conjunctiva/sclera: Conjunctivae normal.  Cardiovascular:     Rate and Rhythm: Normal rate and regular rhythm.  Pulmonary:     Effort: Pulmonary effort is normal. No respiratory distress.     Breath sounds: Normal breath sounds. No stridor.  Abdominal:     General: There is no distension.  Skin:    General: Skin is warm and dry.  Neurological:     Mental Status: She is alert and oriented to person, place, and time.     Cranial Nerves: Facial asymmetry present. No cranial nerve deficit.     Motor: Atrophy present. No weakness.     Comments: I do not appreciate asymmetry of upper extremity strength, no drift, nursing notes suggest pronator drift.  Slight decrease in nasolabial fold right compared to left.  Patient unsure of duration of this.  Psychiatric:        Mood and Affect: Mood normal.     ED Results / Procedures / Treatments   Labs (all labs ordered are listed, but only abnormal results are displayed) Labs Reviewed  BASIC METABOLIC PANEL - Abnormal; Notable for the following components:      Result Value   Glucose, Bld 159 (*)    Calcium 11.4 (*)    All other components within normal limits  CBG MONITORING, ED - Abnormal;  Notable for the following components:   Glucose-Capillary 135 (*)    All other components within normal limits  CBC  PROTIME-INR  APTT  ETHANOL  DIFFERENTIAL  URINALYSIS, ROUTINE W REFLEX MICROSCOPIC  RAPID URINE DRUG SCREEN, HOSP PERFORMED  CBG MONITORING, ED    EKG EKG Interpretation Date/Time:  Monday October 21 2022 12:13:17 EDT Ventricular Rate:  61 PR Interval:  111 QRS Duration:  122 QT Interval:  421 QTC Calculation: 424 R Axis:   -54  Text Interpretation: Sinus rhythm Borderline short PR interval IVCD, consider atypical RBBB Left ventricular hypertrophy Anterior infarct, old Confirmed by Gerhard Munch [phone]) on 10/21/2022 12:34:51 PM  Radiology No results found.  Procedures Procedures  {Document cardiac monitor, telemetry  assessment procedure when appropriate:1}  Medications Ordered in ED Medications  sodium chloride 0.9 % bolus 500 mL (500 mLs Intravenous New Bag/Given 10/21/22 1305)    Followed by  0.9 %  sodium chloride infusion (has no administration in time range)    ED Course/ Medical Decision Making/ A&P   {   Click here for ABCD2, HEART and other calculatorsREFRESH Note before signing :1}                              Medical Decision Making Elderly female with multiple medical problems including hypertension, hypercholesterolemia presents with dizziness at least 18 hours, and on exam is awake, alert, but has facial asymmetry, and per family speech is different from normal.  Differential includes mass, versus stroke, less likely facial nerve palsy.  Patient had MRI, labs monitoring. Cardiac 65 sinus normal Pulse ox 100% room air normal   Amount and/or Complexity of Data Reviewed Independent Historian:     Details: 2 children at bedside External Data Reviewed: notes. Labs: ordered. Decision-making details documented in ED Course. Radiology: ordered and independent interpretation performed. Decision-making details documented in ED  Course. ECG/medicine tests: independent interpretation performed. Decision-making details documented in ED Course.  Risk Prescription drug management. Decision regarding hospitalization. Diagnosis or treatment significantly limited by social determinants of health.  4:26 PM  Labs unremarkable, patient in no distress, awake, alert, speaking clearly.  I have reviewed the MRI, which is not remarkable for stroke, and absent focal neurochanges, some suspicion for dizziness secondary to vertigo, no evidence for stroke.  {Document critical care time when appropriate:1} {Document review of labs and clinical decision tools ie heart score, Chads2Vasc2 etc:1}  {Document your independent review of radiology images, and any outside records:1} {Document your discussion with family members, caretakers, and with consultants:1} {Document social determinants of health affecting pt's care:1} {Document your decision making why or why not admission, treatments were needed:1} Final Clinical Impression(s) / ED Diagnoses Final diagnoses:  None    Rx / DC Orders ED Discharge Orders     None

## 2022-10-29 ENCOUNTER — Other Ambulatory Visit: Payer: Self-pay | Admitting: Internal Medicine

## 2022-10-29 DIAGNOSIS — Z1231 Encounter for screening mammogram for malignant neoplasm of breast: Secondary | ICD-10-CM

## 2022-10-29 DIAGNOSIS — M48061 Spinal stenosis, lumbar region without neurogenic claudication: Secondary | ICD-10-CM | POA: Diagnosis not present

## 2022-10-29 DIAGNOSIS — G8929 Other chronic pain: Secondary | ICD-10-CM | POA: Diagnosis not present

## 2022-10-29 DIAGNOSIS — Z79891 Long term (current) use of opiate analgesic: Secondary | ICD-10-CM | POA: Diagnosis not present

## 2022-11-20 ENCOUNTER — Encounter: Payer: Self-pay | Admitting: Podiatry

## 2022-11-20 ENCOUNTER — Ambulatory Visit: Payer: Medicare Other | Admitting: Podiatry

## 2022-11-20 DIAGNOSIS — M79676 Pain in unspecified toe(s): Secondary | ICD-10-CM

## 2022-11-20 DIAGNOSIS — B351 Tinea unguium: Secondary | ICD-10-CM | POA: Diagnosis not present

## 2022-11-20 DIAGNOSIS — E119 Type 2 diabetes mellitus without complications: Secondary | ICD-10-CM

## 2022-11-20 DIAGNOSIS — L84 Corns and callosities: Secondary | ICD-10-CM

## 2022-11-21 DIAGNOSIS — M5416 Radiculopathy, lumbar region: Secondary | ICD-10-CM | POA: Diagnosis not present

## 2022-11-25 NOTE — Progress Notes (Signed)
Subjective:  Patient ID: Cynthia Torres, female    DOB: Jul 04, 1945,  MRN: [id number]  Cynthia Torres presents to clinic today for: preventative diabetic foot care and corn(s) left foot and painful thick toenails that are difficult to trim. Painful toenails interfere with ambulation. Aggravating factors include wearing enclosed shoe gear. Pain is relieved with periodic professional debridement. Painful corns are aggravated when weightbearing when wearing enclosed shoe gear. Pain is relieved with periodic professional debridement.   PCP is Thana Ates, MD. Theron Arista 08/27/2022.  Allergies  Allergen Reactions   Atorvastatin     Other reaction(s): myalgias    Review of Systems: Negative except as noted in the HPI.  Objective: No changes noted in today's physical examination. There were no vitals filed for this visit.  Cynthia Torres is a pleasant 77 y.o. female in NAD. AAO x 3.  Vascular Examination: Capillary refill time <3 seconds b/l LE. Palpable pedal pulses b/l LE. Digital hair present b/l. No pedal edema b/l. Skin temperature gradient WNL b/l. No varicosities b/l. Marland Kitchen  Dermatological Examination: Pedal skin with normal turgor, texture and tone b/l. No open wounds. No interdigital macerations b/l. Toenails 1-5 b/l thickened, discolored, dystrophic with subungual debris. There is pain on palpation to dorsal aspect of nailplates. Hyperkeratotic lesion(s) left second digit.  No erythema, no edema, no drainage, no fluctuance..  Neurological Examination: Protective sensation intact with 10 gram monofilament b/l LE. Vibratory sensation intact b/l LE.   Musculoskeletal Examination: Muscle strength 5/5 to all lower extremity muscle groups bilaterally. Bunion deformity b/l. Hammertoe(s) noted to the left second digit.  Assessment/Plan: 1. Pain due to onychomycosis of toenail   2. Corns   3. Diabetes mellitus without complication (HCC)    -Patient was evaluated and treated. All patient's  and/or POA's questions/concerns answered on today's visit. -Continue foot and shoe inspections daily. Monitor blood glucose per PCP/Endocrinologist's recommendations. -Continue supportive shoe gear daily. -Mycotic toenails 1-5 bilaterally were debrided in length and girth with sterile nail nippers and dremel without incident. -Corn(s) left second digit pared utilizing sterile scalpel blade without complication or incident. Total number debrided=1. -Patient/POA to call should there be question/concern in the interim.   Return in about 10 weeks (around 01/29/2023).  Freddie Breech, DPM

## 2022-11-27 DIAGNOSIS — M48061 Spinal stenosis, lumbar region without neurogenic claudication: Secondary | ICD-10-CM | POA: Diagnosis not present

## 2022-11-27 DIAGNOSIS — I10 Essential (primary) hypertension: Secondary | ICD-10-CM | POA: Diagnosis not present

## 2022-11-27 DIAGNOSIS — E210 Primary hyperparathyroidism: Secondary | ICD-10-CM | POA: Diagnosis not present

## 2022-11-27 DIAGNOSIS — Z79899 Other long term (current) drug therapy: Secondary | ICD-10-CM | POA: Diagnosis not present

## 2022-11-27 DIAGNOSIS — E119 Type 2 diabetes mellitus without complications: Secondary | ICD-10-CM | POA: Diagnosis not present

## 2022-11-27 DIAGNOSIS — Z23 Encounter for immunization: Secondary | ICD-10-CM | POA: Diagnosis not present

## 2022-11-27 DIAGNOSIS — M79652 Pain in left thigh: Secondary | ICD-10-CM | POA: Diagnosis not present

## 2022-12-04 DIAGNOSIS — M79652 Pain in left thigh: Secondary | ICD-10-CM | POA: Diagnosis not present

## 2022-12-13 ENCOUNTER — Ambulatory Visit
Admission: RE | Admit: 2022-12-13 | Discharge: 2022-12-13 | Disposition: A | Discharge status: Home / Self Care | Payer: Medicare Other | Source: Ambulatory Visit | Attending: Internal Medicine | Admitting: Internal Medicine

## 2022-12-13 ENCOUNTER — Ambulatory Visit: Payer: Medicare Other

## 2022-12-13 DIAGNOSIS — Z1231 Encounter for screening mammogram for malignant neoplasm of breast: Secondary | ICD-10-CM

## 2022-12-31 DIAGNOSIS — M5416 Radiculopathy, lumbar region: Secondary | ICD-10-CM | POA: Diagnosis not present

## 2022-12-31 DIAGNOSIS — M4316 Spondylolisthesis, lumbar region: Secondary | ICD-10-CM | POA: Diagnosis not present

## 2023-01-13 DIAGNOSIS — L989 Disorder of the skin and subcutaneous tissue, unspecified: Secondary | ICD-10-CM | POA: Diagnosis not present

## 2023-01-16 ENCOUNTER — Other Ambulatory Visit: Payer: Self-pay | Admitting: Neurological Surgery

## 2023-02-05 ENCOUNTER — Ambulatory Visit: Payer: Medicare Other | Admitting: Podiatry

## 2023-02-05 ENCOUNTER — Encounter: Payer: Self-pay | Admitting: Podiatry

## 2023-02-05 VITALS — Ht 59.0 in | Wt 175.0 lb

## 2023-02-05 DIAGNOSIS — B351 Tinea unguium: Secondary | ICD-10-CM | POA: Diagnosis not present

## 2023-02-05 DIAGNOSIS — E119 Type 2 diabetes mellitus without complications: Secondary | ICD-10-CM | POA: Diagnosis not present

## 2023-02-05 DIAGNOSIS — L84 Corns and callosities: Secondary | ICD-10-CM

## 2023-02-05 DIAGNOSIS — M79676 Pain in unspecified toe(s): Secondary | ICD-10-CM | POA: Diagnosis not present

## 2023-02-09 NOTE — Progress Notes (Signed)
  Subjective:  Patient ID: Cynthia Torres, female    DOB: 1945/09/27,  MRN: [id number]  77 y.o. female presents preventative diabetic foot care and corn(s) left foot and painful thick toenails that are difficult to trim. Painful toenails interfere with ambulation. Aggravating factors include wearing enclosed shoe gear. Pain is relieved with periodic professional debridement. Painful corns are aggravated when weightbearing when wearing enclosed shoe gear. Pain is relieved with periodic professional debridement.  She is now using a walker due to back issues. She is scheduled to have back surgery this month. Chief Complaint  Patient presents with   Nail Problem    Pt is here for [facility], unsure of last A1C, PCP is Dr Margaretann Loveless and LOV was in the summer.   New problem(s): None   PCP is Thana Ates, MD.  Allergies  Allergen Reactions   Atorvastatin     Other reaction(s): myalgias    Review of Systems: Negative except as noted in the HPI.   Objective:  Cynthia Torres is a pleasant 77 y.o. female WD, WN in NAD. AAO x 3.  Vascular Examination: Capillary refill time <3 seconds b/l LE. Palpable pedal pulses b/l LE. Digital hair present b/l. No pedal edema b/l. Skin temperature gradient WNL b/l. No varicosities b/l. Marland Kitchen  Dermatological Examination: Pedal skin with normal turgor, texture and tone b/l. No open wounds. No interdigital macerations b/l. Toenails 1-5 b/l thickened, discolored, dystrophic with subungual debris. There is pain on palpation to dorsal aspect of nailplates.   Hyperkeratotic lesion(s) left second digit.  No erythema, no edema, no drainage, no fluctuance..  Neurological Examination: Protective sensation intact with 10 gram monofilament b/l LE. Vibratory sensation intact b/l LE.   Musculoskeletal Examination: Muscle strength 5/5 to all lower extremity muscle groups bilaterally. Bunion deformity b/l. Hammertoe(s) noted to the left second digit. Using walker for gait assistance  today.  Last A1c:       No data to display         Assessment:   1. Pain due to onychomycosis of toenail   2. Corns   3. Diabetes mellitus without complication (HCC)    Plan:  -Consent given for treatment as described below: -Examined patient. -Continue foot and shoe inspections daily. Monitor blood glucose per PCP/Endocrinologist's recommendations. -Patient to continue soft, supportive shoe gear daily. -Toenails 1-5 b/l were debrided in length and girth with sterile nail nippers and dremel without iatrogenic bleeding.  -Corn(s) L 2nd toe pared utilizing sterile scalpel blade. Pinpoint bleeding addressed with Lumicain Hemostatic Solution. Cleansed with alcohol. TAO applied. No further treatment required. Total number debrided=1. -Patient/POA to call should there be question/concern in the interim.  Return in about 3 months (around 05/06/2023).  Freddie Breech, DPM      [place] LOCATION: [address], [place] [address]                   Office [phone]   [facility] LOCATION: [address] [place], [place] [address] Office [phone]

## 2023-02-18 ENCOUNTER — Other Ambulatory Visit: Payer: Self-pay

## 2023-02-18 ENCOUNTER — Encounter (HOSPITAL_COMMUNITY): Payer: Self-pay | Admitting: Neurological Surgery

## 2023-02-18 NOTE — Progress Notes (Signed)
SDW CALL  Patient was given pre-op instructions over the phone. The opportunity was given for the patient to ask questions. No further questions asked. Patient verbalized understanding of instructions given.   PCP - Electa Sniff Raju,MD Cardiologist - denies Neurologist - Elveria Rising III,MD  PPM/ICD -denies  Device Orders -  Rep Notified -   Chest x-ray - na EKG - 10/25/22 Stress Test - denies ECHO - denies Cardiac Cath - denies  Sleep Study - denies CPAP -   Fasting Blood Sugar -  Checks Blood Sugar - doesn't check it; has a meter and will check her sugar the morning of surgery.   Blood Thinner Instructions:na Aspirin Instructions:na  ERAS Protcol -no PRE-SURGERY Ensure or G2-   COVID TEST- na   Anesthesia review: no  Patient denies shortness of breath, fever, cough and chest pain over the phone call    Surgical Instructions    Your procedure is scheduled on December 18.  Report to [facility] Main Entrance "A" at 0900 A.M., then check in with the Admitting office.  Call this number if you have problems the morning of surgery:  [phone]    Remember:  Do not eat or drink anything after midnight the night before your surgery   Take these medicines the morning of surgery with A SIP OF WATER: Allopurinol,Norvasc,Tenormin,Rosuvastatin  As of today, STOP taking any Aspirin (unless otherwise instructed by your surgeon) , MELOXICAM(MOBIC),Aleve, Naproxen, Ibuprofen, Motrin, Advil, Goody's, BC's, all herbal medications, fish oil, and all vitamins.   Do not take oral diabetes medicines (pills) the morning of surgery. DO NOT TAKE METFORMIN(GLUCOPHAGE) THE MORNING OF SURGERY.    Check your blood sugar the morning of your surgery when you wake up and every 2 hours until you get to the Short Stay unit.  If your blood sugar is less than 70 mg/dL, you will need to treat for low blood sugar: Do not take insulin. Treat a low blood sugar (less than 70 mg/dL) with  cup  of clear juice (cranberry or apple), 4 glucose tablets, OR glucose gel. Recheck blood sugar in 15 minutes after treatment (to make sure it is greater than 70 mg/dL). If your blood sugar is not greater than 70 mg/dL on recheck, call [phone] for further instructions. Report your blood sugar to the short stay nurse when you get to Short Stay.    [place] is not responsible for any belongings or valuables. .   Do NOT Smoke (Tobacco/Vaping)  24 hours prior to your procedure  If you use a CPAP at night, you may bring your mask for your overnight stay.   Contacts, glasses, hearing aids, dentures or partials may not be worn into surgery, please bring cases for these belongings   Patients discharged the day of surgery will not be allowed to drive home, and someone needs to stay with them for 24 hours.     Special instructions:    Oral Hygiene is also important to reduce your risk of infection.  Remember - BRUSH YOUR TEETH THE MORNING OF SURGERY WITH YOUR REGULAR TOOTHPASTE   Day of Surgery:  Take a shower the day of or night before with antibacterial soap. Wear Clean/Comfortable clothing the morning of surgery Do not apply any deodorants/lotions.   Do not wear jewelry or makeup Do not wear lotions, powders, perfumes/colognes, or deodorant. Do not shave 48 hours prior to surgery.  Men may shave face and neck. Do not bring valuables to the hospital. Do not  wear nail polish, gel polish, artificial nails, or any other type of covering on natural nails (fingers and toes) If you have artificial nails or gel coating that need to be removed by a nail salon, please have this removed prior to surgery. Artificial nails or gel coating may interfere with anesthesia's ability to adequately monitor your vital signs. Remember to brush your teeth WITH YOUR REGULAR TOOTHPASTE.

## 2023-02-19 ENCOUNTER — Inpatient Hospital Stay (HOSPITAL_COMMUNITY): Payer: Medicare Other

## 2023-02-19 ENCOUNTER — Inpatient Hospital Stay (HOSPITAL_COMMUNITY): Payer: Medicare Other | Admitting: Anesthesiology

## 2023-02-19 ENCOUNTER — Inpatient Hospital Stay (HOSPITAL_COMMUNITY)
Admission: RE | Disposition: A | Discharge status: Home Health | Payer: Self-pay | Source: Home / Self Care | Attending: Neurological Surgery

## 2023-02-19 ENCOUNTER — Encounter (HOSPITAL_COMMUNITY): Payer: Self-pay | Admitting: Neurological Surgery

## 2023-02-19 ENCOUNTER — Inpatient Hospital Stay (HOSPITAL_COMMUNITY)
Admission: RE | Admit: 2023-02-19 | Discharge: 2023-02-22 | DRG: 428 | Disposition: A | Discharge status: Home Health | Payer: Medicare Other | Attending: Neurological Surgery | Admitting: Neurological Surgery

## 2023-02-19 ENCOUNTER — Other Ambulatory Visit: Payer: Self-pay

## 2023-02-19 DIAGNOSIS — E119 Type 2 diabetes mellitus without complications: Secondary | ICD-10-CM | POA: Diagnosis present

## 2023-02-19 DIAGNOSIS — M109 Gout, unspecified: Secondary | ICD-10-CM | POA: Diagnosis present

## 2023-02-19 DIAGNOSIS — Z96643 Presence of artificial hip joint, bilateral: Secondary | ICD-10-CM | POA: Diagnosis present

## 2023-02-19 DIAGNOSIS — Z888 Allergy status to other drugs, medicaments and biological substances status: Secondary | ICD-10-CM | POA: Diagnosis not present

## 2023-02-19 DIAGNOSIS — I10 Essential (primary) hypertension: Secondary | ICD-10-CM | POA: Diagnosis not present

## 2023-02-19 DIAGNOSIS — Z90710 Acquired absence of both cervix and uterus: Secondary | ICD-10-CM | POA: Diagnosis not present

## 2023-02-19 DIAGNOSIS — Z981 Arthrodesis status: Secondary | ICD-10-CM | POA: Diagnosis not present

## 2023-02-19 DIAGNOSIS — E785 Hyperlipidemia, unspecified: Secondary | ICD-10-CM | POA: Diagnosis not present

## 2023-02-19 DIAGNOSIS — Z79899 Other long term (current) drug therapy: Secondary | ICD-10-CM | POA: Diagnosis not present

## 2023-02-19 DIAGNOSIS — M4316 Spondylolisthesis, lumbar region: Secondary | ICD-10-CM | POA: Diagnosis not present

## 2023-02-19 DIAGNOSIS — M48061 Spinal stenosis, lumbar region without neurogenic claudication: Secondary | ICD-10-CM | POA: Diagnosis not present

## 2023-02-19 DIAGNOSIS — Z7984 Long term (current) use of oral hypoglycemic drugs: Secondary | ICD-10-CM

## 2023-02-19 DIAGNOSIS — M5116 Intervertebral disc disorders with radiculopathy, lumbar region: Secondary | ICD-10-CM | POA: Diagnosis not present

## 2023-02-19 DIAGNOSIS — M4726 Other spondylosis with radiculopathy, lumbar region: Secondary | ICD-10-CM | POA: Diagnosis not present

## 2023-02-19 HISTORY — DX: Unspecified osteoarthritis, unspecified site: M19.90

## 2023-02-19 LAB — BASIC METABOLIC PANEL
Anion gap: 8 (ref 5–15)
BUN: 22 mg/dL (ref 8–23)
CO2: 24 mmol/L (ref 22–32)
Calcium: 10.9 mg/dL — ABNORMAL HIGH (ref 8.9–10.3)
Chloride: 105 mmol/L (ref 98–111)
Creatinine, Ser: 0.85 mg/dL (ref 0.44–1.00)
GFR, Estimated: >60 mL/min (ref >60)
Glucose, Bld: 120 mg/dL — ABNORMAL HIGH (ref 70–99)
Potassium: 3.3 mmol/L — ABNORMAL LOW (ref 3.5–5.1)
Sodium: 137 mmol/L (ref 135–145)

## 2023-02-19 LAB — HEMOGLOBIN A1C
Hgb A1c MFr Bld: 5.6 % (ref 4.8–5.6)
Mean Plasma Glucose: 114.02 mg/dL

## 2023-02-19 LAB — PROTIME-INR
INR: 1 (ref 0.8–1.2)
Prothrombin Time: 13.6 s (ref 11.4–15.2)

## 2023-02-19 LAB — SURGICAL PCR SCREEN
MRSA, PCR: NEGATIVE
Staphylococcus aureus: NEGATIVE

## 2023-02-19 LAB — TYPE AND SCREEN
ABO/RH(D): B POS
Antibody Screen: NEGATIVE

## 2023-02-19 LAB — CBC
HCT: 36 % (ref 36.0–46.0)
Hemoglobin: 11.3 g/dL — ABNORMAL LOW (ref 12.0–15.0)
MCH: 29.7 pg (ref 26.0–34.0)
MCHC: 31.4 g/dL (ref 30.0–36.0)
MCV: 94.5 fL (ref 80.0–100.0)
Platelets: 228 10*3/uL (ref 150–400)
RBC: 3.81 MIL/uL — ABNORMAL LOW (ref 3.87–5.11)
RDW: 12.7 % (ref 11.5–15.5)
WBC: 7 10*3/uL (ref 4.0–10.5)
nRBC: 0 % (ref 0.0–0.2)

## 2023-02-19 LAB — GLUCOSE, CAPILLARY
Glucose-Capillary: 102 mg/dL — ABNORMAL HIGH (ref 70–99)
Glucose-Capillary: 105 mg/dL — ABNORMAL HIGH (ref 70–99)
Glucose-Capillary: 153 mg/dL — ABNORMAL HIGH (ref 70–99)

## 2023-02-19 LAB — ABO/RH: ABO/RH(D): B POS

## 2023-02-19 SURGERY — POSTERIOR LUMBAR FUSION 2 LEVEL
Anesthesia: General | Site: Back

## 2023-02-19 MED ORDER — ATENOLOL 100 MG PO TABS
100.0000 mg | ORAL_TABLET | Freq: Every day | ORAL | Status: DC
Start: 2023-02-20 — End: 2023-02-22
  Filled 2023-02-19 (×3): qty 1

## 2023-02-19 MED ORDER — BUPIVACAINE HCL (PF) 0.25 % IJ SOLN
INTRAMUSCULAR | Status: AC
  Filled 2023-02-19: qty 30

## 2023-02-19 MED ORDER — PROPOFOL 10 MG/ML IV BOLUS
INTRAVENOUS | Status: DC | PRN
  Administered 2023-02-19: 90 mg via INTRAVENOUS

## 2023-02-19 MED ORDER — CHLORHEXIDINE GLUCONATE 0.12 % MT SOLN
15.0000 mL | Freq: Once | OROMUCOSAL | Status: AC
  Administered 2023-02-19: 15 mL via OROMUCOSAL
  Filled 2023-02-19: qty 15

## 2023-02-19 MED ORDER — CEFAZOLIN SODIUM-DEXTROSE 2-4 GM/100ML-% IV SOLN
2.0000 g | Freq: Three times a day (TID) | INTRAVENOUS | Status: AC
  Administered 2023-02-19 – 2023-02-20 (×2): 2 g via INTRAVENOUS
  Filled 2023-02-19 (×2): qty 100

## 2023-02-19 MED ORDER — CHLORHEXIDINE GLUCONATE 0.12 % MT SOLN
15.0000 mL | Freq: Once | OROMUCOSAL | Status: AC
Start: 2023-02-19 — End: 2023-02-19

## 2023-02-19 MED ORDER — POTASSIUM CHLORIDE CRYS ER 10 MEQ PO TBCR
10.0000 meq | EXTENDED_RELEASE_TABLET | Freq: Every day | ORAL | Status: DC
  Administered 2023-02-19 – 2023-02-22 (×4): 10 meq via ORAL
  Filled 2023-02-19 (×4): qty 1

## 2023-02-19 MED ORDER — DEXAMETHASONE SODIUM PHOSPHATE 10 MG/ML IJ SOLN
INTRAMUSCULAR | Status: AC
  Filled 2023-02-19: qty 1

## 2023-02-19 MED ORDER — COLCHICINE 0.6 MG PO TABS
0.6000 mg | ORAL_TABLET | Freq: Every day | ORAL | Status: DC
  Administered 2023-02-20 – 2023-02-21 (×2): 0.6 mg via ORAL
  Filled 2023-02-19 (×3): qty 1

## 2023-02-19 MED ORDER — ONDANSETRON HCL 4 MG/2ML IJ SOLN
INTRAMUSCULAR | Status: AC
  Filled 2023-02-19: qty 2

## 2023-02-19 MED ORDER — LIDOCAINE 2% (20 MG/ML) 5 ML SYRINGE
INTRAMUSCULAR | Status: AC
  Filled 2023-02-19: qty 5

## 2023-02-19 MED ORDER — GABAPENTIN 300 MG PO CAPS
300.0000 mg | ORAL_CAPSULE | ORAL | Status: AC
  Administered 2023-02-19: 300 mg via ORAL
  Filled 2023-02-19: qty 1

## 2023-02-19 MED ORDER — SODIUM CHLORIDE 0.9% FLUSH
3.0000 mL | Freq: Two times a day (BID) | INTRAVENOUS | Status: DC
  Administered 2023-02-20 – 2023-02-21 (×3): 3 mL via INTRAVENOUS

## 2023-02-19 MED ORDER — MIDAZOLAM HCL 2 MG/2ML IJ SOLN
INTRAMUSCULAR | Status: AC
  Filled 2023-02-19: qty 2

## 2023-02-19 MED ORDER — MIDAZOLAM HCL 2 MG/2ML IJ SOLN
INTRAMUSCULAR | Status: DC | PRN
  Administered 2023-02-19: 1 mg via INTRAVENOUS

## 2023-02-19 MED ORDER — MENTHOL 3 MG MT LOZG: 1.0000 | LOZENGE | OROMUCOSAL | Status: DC | PRN

## 2023-02-19 MED ORDER — FENTANYL CITRATE (PF) 250 MCG/5ML IJ SOLN
INTRAMUSCULAR | Status: AC
  Filled 2023-02-19: qty 5

## 2023-02-19 MED ORDER — LIDOCAINE 2% (20 MG/ML) 5 ML SYRINGE
INTRAMUSCULAR | Status: DC | PRN
  Administered 2023-02-19: 100 mg via INTRAVENOUS

## 2023-02-19 MED ORDER — OXYCODONE HCL 5 MG PO TABS
5.0000 mg | ORAL_TABLET | Freq: Once | ORAL | Status: DC | PRN
Start: 2023-02-19 — End: 2023-02-19

## 2023-02-19 MED ORDER — METFORMIN HCL 500 MG PO TABS
500.0000 mg | ORAL_TABLET | Freq: Every day | ORAL | Status: DC
  Administered 2023-02-19 – 2023-02-21 (×3): 500 mg via ORAL
  Filled 2023-02-19 (×3): qty 1

## 2023-02-19 MED ORDER — THROMBIN 20000 UNITS EX SOLR
CUTANEOUS | Status: AC
  Filled 2023-02-19: qty 20000

## 2023-02-19 MED ORDER — METHOCARBAMOL 500 MG PO TABS
500.0000 mg | ORAL_TABLET | Freq: Four times a day (QID) | ORAL | Status: DC | PRN
  Administered 2023-02-19 – 2023-02-22 (×7): 500 mg via ORAL
  Filled 2023-02-19 (×7): qty 1

## 2023-02-19 MED ORDER — HYDROCHLOROTHIAZIDE 25 MG PO TABS
25.0000 mg | ORAL_TABLET | Freq: Every day | ORAL | Status: DC
  Administered 2023-02-20: 25 mg via ORAL
  Filled 2023-02-19: qty 1

## 2023-02-19 MED ORDER — ONDANSETRON HCL 4 MG/2ML IJ SOLN: 4.0000 mg | Freq: Four times a day (QID) | INTRAMUSCULAR | Status: DC | PRN

## 2023-02-19 MED ORDER — ROCURONIUM BROMIDE 10 MG/ML (PF) SYRINGE
PREFILLED_SYRINGE | INTRAVENOUS | Status: DC | PRN
  Administered 2023-02-19: 10 mg via INTRAVENOUS
  Administered 2023-02-19: 80 mg via INTRAVENOUS
  Administered 2023-02-19: 10 mg via INTRAVENOUS

## 2023-02-19 MED ORDER — THROMBIN 20000 UNITS EX SOLR
CUTANEOUS | Status: DC | PRN
  Administered 2023-02-19: 20 mL via TOPICAL

## 2023-02-19 MED ORDER — SODIUM CHLORIDE 0.9 % IV SOLN: 250.0000 mL | INTRAVENOUS | Status: AC

## 2023-02-19 MED ORDER — ACETAMINOPHEN 325 MG PO TABS: 650.0000 mg | ORAL_TABLET | ORAL | Status: DC | PRN

## 2023-02-19 MED ORDER — LISINOPRIL-HYDROCHLOROTHIAZIDE 20-25 MG PO TABS: 1.0000 | ORAL_TABLET | Freq: Every day | ORAL | Status: DC

## 2023-02-19 MED ORDER — ONDANSETRON HCL 4 MG PO TABS: 4.0000 mg | ORAL_TABLET | Freq: Four times a day (QID) | ORAL | Status: DC | PRN

## 2023-02-19 MED ORDER — KCL IN DEXTROSE-NACL 10-5-0.45 MEQ/L-%-% IV SOLN
INTRAVENOUS | Status: AC
  Filled 2023-02-19: qty 1000

## 2023-02-19 MED ORDER — METFORMIN HCL 500 MG PO TABS
500.0000 mg | ORAL_TABLET | Freq: Every day | ORAL | Status: DC
Start: 2023-02-20 — End: 2023-02-19

## 2023-02-19 MED ORDER — FENTANYL CITRATE (PF) 100 MCG/2ML IJ SOLN
25.0000 ug | INTRAMUSCULAR | Status: DC | PRN
  Administered 2023-02-19: 50 ug via INTRAVENOUS

## 2023-02-19 MED ORDER — 0.9 % SODIUM CHLORIDE (POUR BTL) OPTIME
TOPICAL | Status: DC | PRN
  Administered 2023-02-19: 1000 mL

## 2023-02-19 MED ORDER — THROMBIN 5000 UNITS EX SOLR
OROMUCOSAL | Status: DC | PRN
  Administered 2023-02-19 (×2): 5 mL via TOPICAL

## 2023-02-19 MED ORDER — ONDANSETRON HCL 4 MG/2ML IJ SOLN: 4.0000 mg | Freq: Once | INTRAMUSCULAR | Status: DC | PRN

## 2023-02-19 MED ORDER — ORAL CARE MOUTH RINSE: 15.0000 mL | Freq: Once | OROMUCOSAL | Status: AC

## 2023-02-19 MED ORDER — SODIUM CHLORIDE 0.9 % IV SOLN: INTRAVENOUS | Status: DC | PRN

## 2023-02-19 MED ORDER — METFORMIN HCL 500 MG PO TABS
1000.0000 mg | ORAL_TABLET | Freq: Every day | ORAL | Status: DC
  Administered 2023-02-20 – 2023-02-22 (×3): 1000 mg via ORAL
  Filled 2023-02-19 (×3): qty 2

## 2023-02-19 MED ORDER — PHENYLEPHRINE 80 MCG/ML (10ML) SYRINGE FOR IV PUSH (FOR BLOOD PRESSURE SUPPORT)
PREFILLED_SYRINGE | INTRAVENOUS | Status: AC
  Filled 2023-02-19: qty 10

## 2023-02-19 MED ORDER — OXYCODONE HCL 5 MG/5ML PO SOLN
5.0000 mg | Freq: Once | ORAL | Status: DC | PRN
Start: 2023-02-19 — End: 2023-02-19

## 2023-02-19 MED ORDER — ACETAMINOPHEN 10 MG/ML IV SOLN
1000.0000 mg | Freq: Once | INTRAVENOUS | Status: DC | PRN
  Administered 2023-02-19: 1000 mg via INTRAVENOUS

## 2023-02-19 MED ORDER — ALBUMIN HUMAN 5 % IV SOLN: INTRAVENOUS | Status: DC | PRN

## 2023-02-19 MED ORDER — SURGIRINSE WOUND IRRIGATION SYSTEM - OPTIME
TOPICAL | Status: DC | PRN
  Administered 2023-02-19: 450 mL via TOPICAL

## 2023-02-19 MED ORDER — INSULIN ASPART 100 UNIT/ML IJ SOLN
0.0000 [IU] | Freq: Three times a day (TID) | INTRAMUSCULAR | Status: DC
  Administered 2023-02-20 (×2): 3 [IU] via SUBCUTANEOUS
  Administered 2023-02-21: 2 [IU] via SUBCUTANEOUS
  Administered 2023-02-21: 3 [IU] via SUBCUTANEOUS
  Administered 2023-02-22: 2 [IU] via SUBCUTANEOUS

## 2023-02-19 MED ORDER — MORPHINE SULFATE (PF) 2 MG/ML IV SOLN: 2.0000 mg | INTRAVENOUS | Status: DC | PRN

## 2023-02-19 MED ORDER — THROMBIN 5000 UNITS EX SOLR
CUTANEOUS | Status: AC
  Filled 2023-02-19: qty 5000

## 2023-02-19 MED ORDER — DEXAMETHASONE SODIUM PHOSPHATE 10 MG/ML IJ SOLN
INTRAMUSCULAR | Status: DC | PRN
  Administered 2023-02-19: 5 mg via INTRAVENOUS

## 2023-02-19 MED ORDER — SODIUM CHLORIDE 0.9% FLUSH
3.0000 mL | INTRAVENOUS | Status: DC | PRN
Start: 2023-02-19 — End: 2023-02-22

## 2023-02-19 MED ORDER — PROPOFOL 10 MG/ML IV BOLUS
INTRAVENOUS | Status: AC
  Filled 2023-02-19: qty 20

## 2023-02-19 MED ORDER — EPHEDRINE SULFATE-NACL 50-0.9 MG/10ML-% IV SOSY
PREFILLED_SYRINGE | INTRAVENOUS | Status: DC | PRN
  Administered 2023-02-19 (×2): 5 mg via INTRAVENOUS
  Administered 2023-02-19: 7.5 mg via INTRAVENOUS
  Administered 2023-02-19: 5 mg via INTRAVENOUS
  Administered 2023-02-19: 2.5 mg via INTRAVENOUS

## 2023-02-19 MED ORDER — PHENOL 1.4 % MT LIQD: 1.0000 | OROMUCOSAL | Status: DC | PRN

## 2023-02-19 MED ORDER — ACETAMINOPHEN 500 MG PO TABS
1000.0000 mg | ORAL_TABLET | ORAL | Status: AC
  Administered 2023-02-19: 1000 mg via ORAL
  Filled 2023-02-19: qty 2

## 2023-02-19 MED ORDER — METHOCARBAMOL 1000 MG/10ML IJ SOLN
500.0000 mg | Freq: Four times a day (QID) | INTRAMUSCULAR | Status: DC | PRN
Start: 2023-02-19 — End: 2023-02-22

## 2023-02-19 MED ORDER — EPHEDRINE 5 MG/ML INJ
INTRAVENOUS | Status: AC
  Filled 2023-02-19: qty 5

## 2023-02-19 MED ORDER — FENTANYL CITRATE (PF) 250 MCG/5ML IJ SOLN
INTRAMUSCULAR | Status: DC | PRN
  Administered 2023-02-19 (×2): 50 ug via INTRAVENOUS
  Administered 2023-02-19: 100 ug via INTRAVENOUS

## 2023-02-19 MED ORDER — PHENYLEPHRINE 80 MCG/ML (10ML) SYRINGE FOR IV PUSH (FOR BLOOD PRESSURE SUPPORT)
PREFILLED_SYRINGE | INTRAVENOUS | Status: DC | PRN
  Administered 2023-02-19 (×4): 80 ug via INTRAVENOUS

## 2023-02-19 MED ORDER — FUROSEMIDE 20 MG PO TABS
20.0000 mg | ORAL_TABLET | Freq: Every day | ORAL | Status: DC
  Administered 2023-02-19 – 2023-02-22 (×4): 20 mg via ORAL
  Filled 2023-02-19 (×4): qty 1

## 2023-02-19 MED ORDER — CELECOXIB 200 MG PO CAPS
200.0000 mg | ORAL_CAPSULE | Freq: Two times a day (BID) | ORAL | Status: DC
  Administered 2023-02-19 – 2023-02-22 (×6): 200 mg via ORAL
  Filled 2023-02-19 (×6): qty 1

## 2023-02-19 MED ORDER — SUGAMMADEX SODIUM 200 MG/2ML IV SOLN
INTRAVENOUS | Status: DC | PRN
  Administered 2023-02-19: 160 mg via INTRAVENOUS

## 2023-02-19 MED ORDER — CHLORHEXIDINE GLUCONATE CLOTH 2 % EX PADS: 6.0000 | MEDICATED_PAD | Freq: Once | CUTANEOUS | Status: DC

## 2023-02-19 MED ORDER — AMLODIPINE BESYLATE 5 MG PO TABS
5.0000 mg | ORAL_TABLET | Freq: Every day | ORAL | Status: DC
Start: 2023-02-20 — End: 2023-02-22
  Administered 2023-02-21: 5 mg via ORAL
  Filled 2023-02-19: qty 1

## 2023-02-19 MED ORDER — ACETAMINOPHEN 650 MG RE SUPP: 650.0000 mg | RECTAL | Status: DC | PRN

## 2023-02-19 MED ORDER — HYDROCODONE-ACETAMINOPHEN 10-325 MG PO TABS
1.0000 | ORAL_TABLET | ORAL | Status: DC | PRN
  Administered 2023-02-19 – 2023-02-22 (×13): 1 via ORAL
  Filled 2023-02-19 (×13): qty 1

## 2023-02-19 MED ORDER — FOSINOPRIL SODIUM 20 MG PO TABS: 40.0000 mg | ORAL_TABLET | Freq: Every day | ORAL | Status: DC

## 2023-02-19 MED ORDER — ONDANSETRON HCL 4 MG/2ML IJ SOLN
INTRAMUSCULAR | Status: DC | PRN
  Administered 2023-02-19: 4 mg via INTRAVENOUS

## 2023-02-19 MED ORDER — GATIFLOXACIN 0.5 % OP SOLN: 1.0000 [drp] | Freq: Four times a day (QID) | OPHTHALMIC | Status: DC

## 2023-02-19 MED ORDER — PHENYLEPHRINE HCL-NACL 20-0.9 MG/250ML-% IV SOLN
INTRAVENOUS | Status: DC | PRN
  Administered 2023-02-19: 25 ug/min via INTRAVENOUS

## 2023-02-19 MED ORDER — SENNA 8.6 MG PO TABS
1.0000 | ORAL_TABLET | Freq: Two times a day (BID) | ORAL | Status: DC
  Administered 2023-02-19 – 2023-02-22 (×6): 8.6 mg via ORAL
  Filled 2023-02-19 (×6): qty 1

## 2023-02-19 MED ORDER — ROCURONIUM BROMIDE 10 MG/ML (PF) SYRINGE
PREFILLED_SYRINGE | INTRAVENOUS | Status: AC
  Filled 2023-02-19: qty 10

## 2023-02-19 MED ORDER — LACTATED RINGERS IV SOLN: INTRAVENOUS | Status: DC

## 2023-02-19 MED ORDER — INSULIN ASPART 100 UNIT/ML IJ SOLN: 0.0000 [IU] | INTRAMUSCULAR | Status: DC | PRN

## 2023-02-19 MED ORDER — FENTANYL CITRATE (PF) 100 MCG/2ML IJ SOLN
INTRAMUSCULAR | Status: AC
  Filled 2023-02-19: qty 2

## 2023-02-19 MED ORDER — BUPIVACAINE HCL (PF) 0.25 % IJ SOLN
INTRAMUSCULAR | Status: DC | PRN
  Administered 2023-02-19: 6 mL

## 2023-02-19 MED ORDER — LISINOPRIL 20 MG PO TABS: 20.0000 mg | ORAL_TABLET | Freq: Every day | ORAL | Status: DC

## 2023-02-19 MED ORDER — CEFAZOLIN SODIUM-DEXTROSE 2-4 GM/100ML-% IV SOLN
2.0000 g | INTRAVENOUS | Status: AC
  Administered 2023-02-19: 2 g via INTRAVENOUS
  Filled 2023-02-19: qty 100

## 2023-02-19 MED ORDER — ALLOPURINOL 300 MG PO TABS
300.0000 mg | ORAL_TABLET | Freq: Every day | ORAL | Status: DC
Start: 2023-02-20 — End: 2023-02-22
  Administered 2023-02-20 – 2023-02-22 (×3): 300 mg via ORAL
  Filled 2023-02-19 (×3): qty 1

## 2023-02-19 SURGICAL SUPPLY — 56 items
ALLOGRAFT BONE FIBER KORE 5 (Bone Implant) IMPLANT
BAG COUNTER SPONGE SURGICOUNT (BAG) ×1 IMPLANT
BASKET BONE COLLECTION (BASKET) ×1 IMPLANT
BENZOIN TINCTURE PRP APPL 2/3 (GAUZE/BANDAGES/DRESSINGS) ×1 IMPLANT
BLADE BONE MILL MEDIUM (MISCELLANEOUS) ×1 IMPLANT
BLADE CLIPPER SURG (BLADE) IMPLANT
BUR CARBIDE MATCH 3.0 (BURR) ×1 IMPLANT
CANISTER SUCT 3000ML PPV (MISCELLANEOUS) ×1 IMPLANT
CNTNR URN SCR LID CUP LEK RST (MISCELLANEOUS) ×1 IMPLANT
COVER BACK TABLE 60X90IN (DRAPES) ×1 IMPLANT
DERMABOND ADVANCED .7 DNX12 (GAUZE/BANDAGES/DRESSINGS) ×1 IMPLANT
DRAPE C-ARM 42X72 X-RAY (DRAPES) ×2 IMPLANT
DRAPE C-ARMOR (DRAPES) ×2 IMPLANT
DRAPE LAPAROTOMY 100X72X124 (DRAPES) ×1 IMPLANT
DRAPE SURG 17X23 STRL (DRAPES) ×1 IMPLANT
DRSG OPSITE POSTOP 4X6 (GAUZE/BANDAGES/DRESSINGS) IMPLANT
DURAPREP 26ML APPLICATOR (WOUND CARE) ×1 IMPLANT
ELECT REM PT RETURN 9FT ADLT (ELECTROSURGICAL) ×1
ELECTRODE REM PT RTRN 9FT ADLT (ELECTROSURGICAL) ×1 IMPLANT
EVACUATOR 1/8 PVC DRAIN (DRAIN) ×1 IMPLANT
GAUZE 4X4 16PLY ~~LOC~~+RFID DBL (SPONGE) IMPLANT
GLOVE BIO SURGEON STRL SZ7 (GLOVE) IMPLANT
GLOVE BIO SURGEON STRL SZ8 (GLOVE) ×2 IMPLANT
GLOVE BIOGEL PI IND STRL 7.0 (GLOVE) IMPLANT
GOWN STRL REUS W/ TWL LRG LVL3 (GOWN DISPOSABLE) IMPLANT
GOWN STRL REUS W/ TWL XL LVL3 (GOWN DISPOSABLE) ×2 IMPLANT
GOWN STRL REUS W/TWL 2XL LVL3 (GOWN DISPOSABLE) IMPLANT
GRAFT BONE PROTEIOS LRG 5CC (Orthopedic Implant) IMPLANT
HEMOSTAT POWDER KIT SURGIFOAM (HEMOSTASIS) ×1 IMPLANT
KIT BASIN OR (CUSTOM PROCEDURE TRAY) ×1 IMPLANT
KIT TURNOVER KIT B (KITS) ×1 IMPLANT
MILL BONE PREP (MISCELLANEOUS) ×1 IMPLANT
NDL HYPO 25X1 1.5 SAFETY (NEEDLE) ×1 IMPLANT
NEEDLE HYPO 25X1 1.5 SAFETY (NEEDLE) ×1 IMPLANT
NS IRRIG 1000ML POUR BTL (IV SOLUTION) ×1 IMPLANT
PACK LAMINECTOMY NEURO (CUSTOM PROCEDURE TRAY) ×1 IMPLANT
PAD ARMBOARD 7.5X6 YLW CONV (MISCELLANEOUS) ×3 IMPLANT
ROD LORD LIPPED TI 5.5X50 (Rod) IMPLANT
SCREW CORT SHANK MOD 6.5X40 (Screw) IMPLANT
SCREW KODIAK 6.5X40 (Screw) IMPLANT
SCREW POLYAXIAL TULIP (Screw) IMPLANT
SET SCREW SPNE (Screw) IMPLANT
SOLUTION IRRIG SURGIPHOR (IV SOLUTION) ×1 IMPLANT
SPACER IDENT 9X10X25 5D (Spacer) IMPLANT
SPACER IDENTITI 9X8X25 6D (Spacer) IMPLANT
SPONGE SURGIFOAM ABS GEL 100 (HEMOSTASIS) ×1 IMPLANT
SPONGE T-LAP 4X18 ~~LOC~~+RFID (SPONGE) IMPLANT
STRIP CLOSURE SKIN 1/2X4 (GAUZE/BANDAGES/DRESSINGS) ×2 IMPLANT
SUT VIC AB 0 CT1 18XCR BRD8 (SUTURE) ×1 IMPLANT
SUT VIC AB 2-0 CP2 18 (SUTURE) ×1 IMPLANT
SUT VIC AB 3-0 SH 8-18 (SUTURE) ×2 IMPLANT
SYR CONTROL 10ML LL (SYRINGE) ×1 IMPLANT
TOWEL GREEN STERILE (TOWEL DISPOSABLE) ×1 IMPLANT
TOWEL GREEN STERILE FF (TOWEL DISPOSABLE) ×1 IMPLANT
TRAY FOLEY MTR SLVR 16FR STAT (SET/KITS/TRAYS/PACK) ×1 IMPLANT
WATER STERILE IRR 1000ML POUR (IV SOLUTION) ×1 IMPLANT

## 2023-02-19 NOTE — Op Note (Signed)
02/19/2023  3:59 PM  PATIENT:  Cynthia Torres  77 y.o. female  PRE-OPERATIVE DIAGNOSIS: Spondylolisthesis L4-5, severe spinal stenosis L3-4 and L4-5, back pain with radiculopathy  POST-OPERATIVE DIAGNOSIS:  same  PROCEDURE:   1. Decompressive lumbar laminectomy, hemi facetectomy and foraminotomies L3-4 and L4-5 requiring more work than would be required for a simple exposure of the disk for PLIF in order to adequately decompress the neural elements and address the spinal stenosis 2. Posterior lumbar interbody fusion L3-4 and L4-5 using PTI interbody cages packed with morcellized allograft and autograft  3. Posterior fixation L3-L5 inclusive using ATEC cortical pedicle screws.  4. Intertransverse arthrodesis L3-L5 using morcellized autograft and allograft.  SURGEON:  Marikay Alar, MD  ASSISTANTS: Verlin Dike, FNP  ANESTHESIA:  General  EBL: 400 ml  Total I/O In: 1350 [I.V.:1000; IV Piggyback:350] Out: 850 [Urine:450; Blood:400]  BLOOD ADMINISTERED:none  DRAINS: none   INDICATION FOR PROCEDURE: This patient presented with back pain with severe left leg pain consistent with radiculopathy. Imaging revealed spondylolisthesis L4-5 with severe spinal stenosis and severe spinal stenosis L3-4. The patient tried a reasonable attempt at conservative medical measures without relief. I recommended decompression and instrumented fusion to address the stenosis as well as the segmental  instability.  Patient understood the risks, benefits, and alternatives and potential outcomes and wished to proceed.  PROCEDURE DETAILS:  The patient was brought to the operating room. After induction of generalized endotracheal anesthesia the patient was rolled into the prone position on chest rolls and all pressure points were padded. The patient's lumbar region was cleaned and then prepped with DuraPrep and draped in the usual sterile fashion. Anesthesia was injected and then a dorsal midline incision was  made and carried down to the lumbosacral fascia. The fascia was opened and the paraspinous musculature was taken down in a subperiosteal fashion to expose L3-4 and L4-5. A self-retaining retractor was placed. Intraoperative fluoroscopy confirmed my level, and I started with placement of the L3 cortical pedicle screws. The pedicle screw entry zones were identified utilizing surface landmarks and  AP and lateral fluoroscopy. I scored the cortex with the high-speed drill and then used the hand drill to drill an upward and outward direction into the pedicle. I then tapped line to line. I then placed a 6.5 x 40 mm cortical pedicle screw into the pedicles of L3 bilaterally.    I then turned my attention to the decompression and complete lumbar laminectomies, hemi- facetectomies, and foraminotomies were performed at L3-4 and L4-5.  My nurse practitioner was directly involved in the decompression and exposure of the neural elements. the patient had significant spinal stenosis and this required more work than would be required for a simple exposure of the disc for posterior lumbar interbody fusion which would only require a limited laminotomy. Much more generous decompression and generous foraminotomy was undertaken in order to adequately decompress the neural elements and address the patient's leg pain. The yellow ligament was removed to expose the underlying dura and nerve roots, and generous foraminotomies were performed to adequately decompress the neural elements. Both the exiting and traversing nerve roots were decompressed on both sides until a coronary dilator passed easily along the nerve roots. Once the decompression was complete, I turned my attention to the posterior lower lumbar interbody fusion. The epidural venous vasculature was coagulated and cut sharply. Disc space was incised and the initial discectomy was performed with pituitary rongeurs. The disc space was distracted with sequential distractors to a  height  of 8 mm at L3-4 and 10 mm at L4-5. We then used a series of scrapers and shavers to prepare the endplates for fusion. The midline was prepared with Epstein curettes. Once the complete discectomy was finished, we packed an appropriate sized interbody cage with local autograft and morcellized allograft, gently retracted the nerve root, and tapped the cage into position at L3-4 and L4-5.  The midline between the cages was packed with morselized autograft and allograft.   We then turned our attention to the placement of the lower pedicle screws. The pedicle screw entry zones were identified utilizing surface landmarks and fluoroscopy. I drilled into each pedicle utilizing the hand drill, and tapped each pedicle with the appropriate tap. We palpated with a ball probe to assure no break in the cortex. We then placed 6.5 x 40 mm pedicle screws into the pedicles bilaterally at L4 and L5.  My nurse practitioner assisted in placement of the pedicle screws.  We then decorticated the transverse processes and laid a mixture of morcellized autograft and allograft out over these to perform intertransverse arthrodesis at L3-4 and L4-5. We then placed lordotic rods into the multiaxial screw heads of the pedicle screws and locked these in position with the locking caps and anti-torque device. We then checked our construct with AP and lateral fluoroscopy. Irrigated with copious amounts of 0.5% povidone iodine solution followed by saline solution. Inspected the nerve roots once again to assure adequate decompression, lined to the dura with Gelfoam,  and then we closed the muscle and the fascia with 0 Vicryl. Closed the subcutaneous tissues with 2-0 Vicryl and subcuticular tissues with 3-0 Vicryl. The skin was closed with benzoin and Steri-Strips. Dressing was then applied, the patient was awakened from general anesthesia and transported to the recovery room in stable condition. At the end of the procedure all sponge, needle  and instrument counts were correct.   PLAN OF CARE: admit to inpatient  PATIENT DISPOSITION:  PACU - hemodynamically stable.   Delay start of Pharmacological VTE agent (>24hrs) due to surgical blood loss or risk of bleeding:  yes

## 2023-02-19 NOTE — Progress Notes (Signed)
Orthopedic Tech Progress Note Patient Details:  Cynthia Torres Dec 05, 1945 [id number]  Lumbar Corsett Applied to patient  Ortho Devices Type of Ortho Device: Lumbar corsett Ortho Device/Splint Interventions: Ordered, Application, Adjustment   Post Interventions Patient Tolerated: Well Instructions Provided: Adjustment of device, Care of device  Diannia Ruder 02/19/2023, 7:46 PM

## 2023-02-19 NOTE — Transfer of Care (Signed)
Immediate Anesthesia Transfer of Care Note  Patient: Cynthia Torres  Procedure(s) Performed: POSTERIOR LUMBAR INTERBODY FUSION LUMBAR THREE-FOUR/LUMBAR FOUR-FIVE POSTERIOR LATERAL AND INTERBODY FUSION (Back)  Patient Location: PACU  Anesthesia Type:General  Level of Consciousness: awake and alert   Airway & Oxygen Therapy: Patient Spontanous Breathing and Patient connected to face mask oxygen  Post-op Assessment: Report given to RN and Post -op Vital signs reviewed and stable  Post vital signs: Reviewed and stable  Last Vitals:  Vitals Value Taken Time  BP 111/58 02/19/23 1608  Temp    Pulse 55 02/19/23 1612  Resp 15 02/19/23 1612  SpO2 100 % 02/19/23 1612  Vitals shown include unfiled device data.  Last Pain:  Vitals:   02/19/23 1010  PainSc: 7          Complications: No notable events documented.

## 2023-02-19 NOTE — Anesthesia Procedure Notes (Signed)
Procedure Name: Intubation Date/Time: 02/19/2023 12:27 PM  Performed by: Audie Pinto, CRNAPre-anesthesia Checklist: Patient identified, Emergency Drugs available, Suction available and Patient being monitored Patient Re-evaluated:Patient Re-evaluated prior to induction Oxygen Delivery Method: Circle system utilized Preoxygenation: Pre-oxygenation with 100% oxygen Induction Type: IV induction Ventilation: Mask ventilation without difficulty Laryngoscope Size: Mac and 4 Grade View: Grade II Tube type: Oral Tube size: 7.0 mm Number of attempts: 1 Airway Equipment and Method: Stylet and Oral airway Placement Confirmation: ETT inserted through vocal cords under direct vision, positive ETCO2 and breath sounds checked- equal and bilateral Secured at: 21 cm Tube secured with: Tape Dental Injury: Teeth and Oropharynx as per pre-operative assessment

## 2023-02-19 NOTE — H&P (Signed)
Subjective: Patient is a 77 y.o. female admitted for PLIF. Onset of symptoms was several months ago, gradually worsening since that time.  The pain is rated severe, and is located at the across the lower back and radiates to LLE. The pain is described as aching and occurs all day. The symptoms have been progressive. Symptoms are exacerbated by exercise, standing, and walking for more than a few minutes. MRI or CT showed spondylolisthesis with stenosis L3-4 L4-5   Past Medical History:  Diagnosis Date   Arthritis    Diabetes mellitus without complication (HCC)    Gout    HTN (hypertension)    Hyperlipidemia     Past Surgical History:  Procedure Laterality Date   ABDOMINAL HYSTERECTOMY     CESAREAN SECTION     JOINT REPLACEMENT Bilateral    hips    Prior to Admission medications   Medication Sig Start Date End Date Taking? Authorizing Provider  allopurinol (ZYLOPRIM) 300 MG tablet  03/05/18  Yes [provider]  amLODipine (NORVASC) 5 MG tablet Take 5 mg by mouth daily.   Yes [provider]  atenolol (TENORMIN) 100 MG tablet Take 100 mg by mouth daily.   Yes [provider]  colchicine 0.6 MG tablet Take 0.6 mg by mouth daily. 02/12/23  Yes [provider]  fosinopril (MONOPRIL) 40 MG tablet Take 40 mg by mouth daily.   Yes [provider]  furosemide (LASIX) 20 MG tablet Take 20 mg by mouth daily. 11/15/16  Yes [provider]  HYDROcodone-acetaminophen (NORCO/VICODIN) 5-325 MG tablet Take 1 tablet by mouth every 6 (six) hours as needed for moderate pain. 12/29/15  Yes Helane Gunther, DPM  lisinopril-hydrochlorothiazide (PRINZIDE,ZESTORETIC) 20-25 MG tablet Take 1 tablet by mouth daily. 03/17/18  Yes [provider]  meclizine (ANTIVERT) 25 MG tablet Take 1 tablet (25 mg total) by mouth 3 (three) times daily as needed for dizziness. 10/21/22  Yes Wynetta Fines, MD  metFORMIN (GLUCOPHAGE) 500 MG tablet Take 500-1,000 mg by mouth  in the morning, at noon, and at bedtime. 1,000mg  in AM and 500mg  in PM   Yes [provider]  moxifloxacin (VIGAMOX) 0.5 % ophthalmic solution Place 1 drop into the right eye 4 (four) times daily. 03/16/19  Yes [provider]  ondansetron (ZOFRAN) 4 MG tablet Take 1 tablet (4 mg total) by mouth every 6 (six) hours. 10/21/22  Yes Wynetta Fines, MD  potassium chloride (MICRO-K) 10 MEQ CR capsule Take 10 mEq by mouth 2 (two) times daily. 01/20/20  Yes [provider]  rosuvastatin (CRESTOR) 10 MG tablet Take 5 mg by mouth once. 05/04/20  Yes [provider]  ONETOUCH VERIO test strip  05/08/18   [provider]   Allergies  Allergen Reactions   Atorvastatin     Other reaction(s): myalgias    Social History   Tobacco Use   Smoking status: Never   Smokeless tobacco: Never  Substance Use Topics   Alcohol use: No    Alcohol/week: 0.0 standard drinks of alcohol    Family History  Problem Relation Age of Onset   Breast cancer Neg Hx      Review of Systems  Positive ROS: neg  All other systems have been reviewed and were otherwise negative with the exception of those mentioned in the HPI and as above.  Objective: Vital signs in last 24 hours: Temp:  [97.5 F (36.4 C)] 97.5 F (36.4 C) (12/18 0929) Pulse Rate:  [66]  66 (12/18 0929) Resp:  [20] 20 (12/18 0929) BP: (147)/(70) 147/70 (12/18 0929) SpO2:  [100 %] 100 % (12/18 0929) Weight:  [78 kg] 78 kg (12/18 0929)  General Appearance: Alert, cooperative, no distress, appears stated age Head: Normocephalic, without obvious abnormality, atraumatic Eyes: PERRL, conjunctiva/corneas clear, EOM's intact    Neck: Supple, symmetrical, trachea midline Back: Symmetric, no curvature, ROM normal, no CVA tenderness Lungs:  respirations unlabored Heart: Regular rate and rhythm Abdomen: Soft, non-tender Extremities: Extremities normal, atraumatic, no cyanosis or edema Pulses: 2+ and symmetric all  extremities Skin: Skin color, texture, turgor normal, no rashes or lesions  NEUROLOGIC:   Mental status: Alert and oriented x4,  no aphasia, good attention span, fund of knowledge, and memory Motor Exam - grossly normal Sensory Exam - grossly normal Reflexes: 1+ Coordination - grossly normal Gait - grossly normal Balance - grossly normal Cranial Nerves: I: smell Not tested  II: visual acuity  OS: nl    OD: nl  II: visual fields Full to confrontation  II: pupils Equal, round, reactive to light  III,VII: ptosis None  III,IV,VI: extraocular muscles  Full ROM  V: mastication Normal  V: facial light touch sensation  Normal  V,VII: corneal reflex  Present  VII: facial muscle function - upper  Normal  VII: facial muscle function - lower Normal  VIII: hearing Not tested  IX: soft palate elevation  Normal  IX,X: gag reflex Present  XI: trapezius strength  5/5  XI: sternocleidomastoid strength 5/5  XI: neck flexion strength  5/5  XII: tongue strength  Normal    Data Review Lab Results  Component Value Date   WBC 7.0 02/19/2023   HGB 11.3 (L) 02/19/2023   HCT 36.0 02/19/2023   MCV 94.5 02/19/2023   PLT 228 02/19/2023   Lab Results  Component Value Date   NA 137 02/19/2023   K 3.3 (L) 02/19/2023   CL 105 02/19/2023   CO2 24 02/19/2023   BUN 22 02/19/2023   CREATININE 0.85 02/19/2023   GLUCOSE 120 (H) 02/19/2023   Lab Results  Component Value Date   INR 1.0 02/19/2023    Assessment/Plan:  Estimated body mass index is 34.74 kg/m as calculated from the following:   Height as of this encounter: 4\' 11"  (1.499 m).   Weight as of this encounter: 78 kg. Patient admitted for PLIF L3-4 L4-5. Patient has failed a reasonable attempt at conservative therapy.  I explained the condition and procedure to the patient and answered any questions.  Patient wishes to proceed with procedure as planned. Understands risks/ benefits and typical outcomes of procedure.   Tia Alert 02/19/2023 11:36 AM

## 2023-02-19 NOTE — Anesthesia Preprocedure Evaluation (Signed)
Anesthesia Evaluation  Patient identified by MRN, date of birth, ID band Patient awake    Reviewed: Allergy & Precautions, NPO status , Patient's Chart, lab work & pertinent test results, reviewed documented beta blocker date and time   History of Anesthesia Complications Negative for: history of anesthetic complications  Airway Mallampati: III  TM Distance: >3 FB     Dental  (+) Missing,    Pulmonary neg COPD   breath sounds clear to auscultation       Cardiovascular hypertension, (-) angina (-) CAD, (-) Past MI, (-) Cardiac Stents and (-) CABG  Rhythm:Regular Rate:Normal     Neuro/Psych  Neuromuscular disease    GI/Hepatic ,GERD  ,,  Endo/Other  diabetes    Renal/GU      Musculoskeletal  (+) Arthritis ,    Abdominal   Peds  Hematology  (+) Blood dyscrasia, anemia   Anesthesia Other Findings   Reproductive/Obstetrics                              Anesthesia Physical Anesthesia Plan  ASA: 2  Anesthesia Plan: General   Post-op Pain Management: Dilaudid IV and Ofirmev IV (intra-op)*   Induction: Intravenous  PONV Risk Score and Plan: 2 and Ondansetron and Dexamethasone  Airway Management Planned:   Additional Equipment:   Intra-op Plan:   Post-operative Plan: Extubation in OR  Informed Consent: I have reviewed the patients History and Physical, chart, labs and discussed the procedure including the risks, benefits and alternatives for the proposed anesthesia with the patient or authorized representative who has indicated his/her understanding and acceptance.     Dental advisory given  Plan Discussed with: CRNA  Anesthesia Plan Comments:          Anesthesia Quick Evaluation

## 2023-02-20 LAB — GLUCOSE, CAPILLARY
Glucose-Capillary: 161 mg/dL — ABNORMAL HIGH (ref 70–99)
Glucose-Capillary: 158 mg/dL — ABNORMAL HIGH (ref 70–99)
Glucose-Capillary: 116 mg/dL — ABNORMAL HIGH (ref 70–99)
Glucose-Capillary: 149 mg/dL — ABNORMAL HIGH (ref 70–99)

## 2023-02-20 MED FILL — Thrombin For Soln 5000 Unit: CUTANEOUS | Qty: 5000 | Status: AC

## 2023-02-20 NOTE — Evaluation (Signed)
Physical Therapy Evaluation  Patient Details Name: Cynthia Torres MRN: [id number] DOB: 01/09/1946 Today's Date: 02/20/2023  History of Present Illness  Pt is a 77 y/o female who presents s/p L3-L5 PLIF on 02/19/2023. PMH significant for arthritis, DM, gout, HTN.  Clinical Impression  Pt admitted with above diagnosis. At the time of PT eval, pt was able to demonstrate transfers and ambulation with gross CGA and RW for support. Pt was educated on precautions, brace application/wearing schedule, appropriate activity progression, and car transfer. Pt currently with functional limitations due to the deficits listed below (see PT Problem List). Pt will benefit from skilled PT to increase their independence and safety with mobility to allow discharge to the venue listed below.          If plan is discharge home, recommend the following: A little help with walking and/or transfers;A little help with bathing/dressing/bathroom;Assistance with cooking/housework;Assist for transportation;Help with stairs or ramp for entrance   Can travel by private vehicle   Yes    Equipment Recommendations Rolling walker (2 wheels)  Recommendations for Other Services       Functional Status Assessment Patient has had a recent decline in their functional status and demonstrates the ability to make significant improvements in function in a reasonable and predictable amount of time.     Precautions / Restrictions Precautions Precautions: Fall;Back Precaution Booklet Issued: Yes (comment) Precaution Comments: Reviewed handout and pt was cued for precautions during functional mobility. Required Braces or Orthoses: Spinal Brace Spinal Brace: Lumbar corset;Applied in sitting position Restrictions Weight Bearing Restrictions Per Provider Order: No      Mobility  Bed Mobility Overal bed mobility: Needs Assistance Bed Mobility: Rolling, Sidelying to Sit Rolling: Contact guard assist Sidelying to sit: Contact  guard assist       General bed mobility comments: Close guard for safety and VC's for log roll technique. HOB elevated and use of rails required.    Transfers Overall transfer level: Needs assistance Equipment used: Rolling walker (2 wheels) Transfers: Sit to/from Stand Sit to Stand: Contact guard assist           General transfer comment: VC's for hand placement on seated surface for safety. No assist required but close guard and increased time provided to achieve full stand.    Ambulation/Gait Ambulation/Gait assistance: Contact guard assist Gait Distance (Feet): 175 Feet Assistive device: Rolling walker (2 wheels) Gait Pattern/deviations: Step-through pattern, Decreased stride length, Trunk flexed Gait velocity: Decreased Gait velocity interpretation: <1.31 ft/sec, indicative of household ambulator   General Gait Details: VC's for improved posture, closer walker proximity and forward gaze. Slow and effortful gait pattern.  Stairs            Wheelchair Mobility     Tilt Bed    Modified Rankin (Stroke Patients Only)       Balance Overall balance assessment: Needs assistance Sitting-balance support: Feet supported, No upper extremity supported Sitting balance-Leahy Scale: Good     Standing balance support: Bilateral upper extremity supported, During functional activity, Reliant on assistive device for balance Standing balance-Leahy Scale: Poor Standing balance comment: dynamically                             Pertinent Vitals/Pain Pain Assessment Pain Assessment: Faces Faces Pain Scale: Hurts little more Pain Location: back Pain Descriptors / Indicators: Sore, Operative site guarding Pain Intervention(s): Limited activity within patient's tolerance, Monitored during session, Repositioned    Home  Living Family/patient expects to be discharged to:: Skilled nursing facility Living Arrangements: Children (son) Available Help at Discharge:  Family;Available PRN/intermittently (son works) Type of Home: House Home Access: Level entry       Home Layout: One level Home Equipment: Gilmer Mor - single point      Prior Function Prior Level of Function : Independent/Modified Independent;Working/employed;Driving             Mobility Comments: Uses the cane all the time. 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       Extremity/Trunk Assessment   Upper Extremity Assessment Upper Extremity Assessment: Defer to OT evaluation    Lower Extremity Assessment Lower Extremity Assessment: Generalized weakness (Consistent with pre-op diagnosis)    Cervical / Trunk Assessment Cervical / Trunk Assessment: Back Surgery  Communication   Communication Communication: No apparent difficulties Cueing Techniques: Verbal cues;Gestural cues  Cognition Arousal: Alert Behavior During Therapy: WFL for tasks assessed/performed Overall Cognitive Status: No family/caregiver present to determine baseline cognitive functioning                                          General Comments      Exercises     Assessment/Plan    PT Assessment Patient needs continued PT services  PT Problem List Decreased strength;Decreased activity tolerance;Decreased balance;Decreased mobility;Decreased knowledge of use of DME;Decreased safety awareness;Decreased knowledge of precautions;Pain       PT Treatment Interventions DME instruction;Gait training;Functional mobility training;Therapeutic activities;Therapeutic exercise;Balance training;Patient/family education    PT Goals (Current goals can be found in the Care Plan section)  Acute Rehab PT Goals Patient Stated 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 PT Goal Formulation: With patient Time For Goal Achievement: 03/06/23 Potential to Achieve Goals: Good    Frequency Min 5X/week     Co-evaluation               AM-PAC PT "6 Clicks"  Mobility  Outcome Measure Help needed turning from your back to your side while in a flat bed without using bedrails?: A Little Help needed moving from lying on your back to sitting on the side of a flat bed without using bedrails?: A Little Help needed moving to and from a bed to a chair (including a wheelchair)?: A Little Help needed standing up from a chair using your arms (e.g., wheelchair or bedside chair)?: A Little Help needed to walk in hospital room?: A Little Help needed climbing 3-5 steps with a railing? : A Lot 6 Click Score: 17    End of Session Equipment Utilized During Treatment: Gait belt;Back brace Activity Tolerance: Patient tolerated treatment well Patient left: Other (comment) (In bathroom with OT present.) Nurse Communication: Mobility status PT Visit Diagnosis: Unsteadiness on feet (R26.81);Pain Pain - part of body:  (back)    Time: 1324-4010 PT Time Calculation (min) (ACUTE ONLY): 24 min   Charges:   PT Evaluation $PT Eval Low Complexity: 1 Low PT Treatments $Gait Training: 8-22 mins PT General Charges $$ ACUTE PT VISIT: 1 Visit         Conni Slipper, PT, DPT Acute Rehabilitation Services Secure Chat Preferred Office: [phone]   Cynthia Torres 02/20/2023, 10:20 AM

## 2023-02-20 NOTE — [place] FL2 (Signed)
MEDICAID FL2 LEVEL OF CARE FORM     IDENTIFICATION  Patient Name: Cynthia Torres Birthdate: February 12, 1946 Sex: female Admission Date (Current Location): 02/19/2023  [facility] and [place] Number:  Producer, television/film/video and Address:  [facility] [place], [address], [place], [place] [address]      Provider Number: [id number]  Attending Physician Name and Address:  Arman Bogus, MD  Relative Name and Phone Number:       Current Level of Care: Hospital Recommended Level of Care: Skilled Nursing Facility Prior Approval Number:    Date Approved/Denied:   PASRR Number: [id number]  Discharge Plan: SNF    Current Diagnoses: Patient Active Problem List   Diagnosis Date Noted   S/P lumbar fusion 02/19/2023   Sacroiliac joint pain 12/28/2021   Low back pain, unspecified 09/19/2021   Body mass index (BMI) 40.0-44.9, adult (HCC) 06/12/2021   Essential hypertension 06/12/2021   Gastroesophageal reflux disease 06/12/2021   Gout 06/12/2021   Hypercalcemia 06/12/2021   Hypercholesterolemia 06/12/2021   Irritable bowel syndrome with constipation 06/12/2021   Lumbar spinal stenosis 06/12/2021   Morbid obesity (HCC) 06/12/2021   Osteoarthritis 06/12/2021   Primary insomnia 06/12/2021   Type 2 diabetes mellitus with other specified complication (HCC) 06/12/2021   Primary hyperparathyroidism (HCC) 06/12/2021   Chronic pain 08/24/2020   Myofascial pain 08/24/2020   Sacroiliitis, not elsewhere classified (HCC) 08/24/2020   Sciatica 08/24/2020   Acquired spondylolisthesis 05/05/2020   Displacement of lumbar intervertebral disc without myelopathy 05/05/2020   Acquired ptosis of eyelid of both eyes 09/03/2019   Primary osteoarthritis of first carpometacarpal joint of right hand 05/21/2019   Trigger middle finger of right hand 05/21/2019   Cranial neuritis 02/23/2018    Orientation RESPIRATION BLADDER Height & Weight     Self, Time, Situation,  Place  Normal Continent Weight: 172 lb (78 kg) Height:  4\' 11"  (149.9 cm)  BEHAVIORAL SYMPTOMS/MOOD NEUROLOGICAL BOWEL NUTRITION STATUS      Continent Diet (carb modified)  AMBULATORY STATUS COMMUNICATION OF NEEDS Skin   Limited Assist Verbally Surgical wounds (closed back, honeycomb dressing)                       Personal Care Assistance Level of Assistance  Bathing, Feeding, Dressing Bathing Assistance: Limited assistance Feeding assistance: Independent Dressing Assistance: Limited assistance     Functional Limitations Info             SPECIAL CARE FACTORS FREQUENCY  PT (By licensed PT), OT (By licensed OT)     PT Frequency: 5x/wk OT Frequency: 5x/wk            Contractures Contractures Info: Not present    Additional Factors Info  Code Status, Allergies Code Status Info: Full Allergies Info: Atorvastatin           Current Medications (02/20/2023):  This is the current hospital active medication list Current Facility-Administered Medications  Medication Dose Route Frequency Provider Last Rate Last Admin   0.9 %  sodium chloride infusion  250 mL Intravenous Continuous Arman Bogus, MD       acetaminophen (TYLENOL) tablet 650 mg  650 mg Oral Q4H PRN Arman Bogus, MD       Or   acetaminophen (TYLENOL) suppository 650 mg  650 mg Rectal Q4H PRN Arman Bogus, MD       allopurinol (ZYLOPRIM) tablet 300 mg  300 mg Oral Daily Arman Bogus,  MD   300 mg at 02/20/23 1038   amLODipine (NORVASC) tablet 5 mg  5 mg Oral Daily Arman Bogus, MD       atenolol (TENORMIN) tablet 100 mg  100 mg Oral Daily Arman Bogus, MD       celecoxib (CELEBREX) capsule 200 mg  200 mg Oral Q12H Arman Bogus, MD   200 mg at 02/20/23 1037   colchicine tablet 0.6 mg  0.6 mg Oral Daily Arman Bogus, MD   0.6 mg at 02/20/23 1038   dextrose 5 % and 0.45 % NaCl with KCl 10 mEq/L infusion   Intravenous Continuous Arman Bogus, MD        furosemide (LASIX) tablet 20 mg  20 mg Oral Daily Arman Bogus, MD   20 mg at 02/20/23 1038   lisinopril (ZESTRIL) tablet 20 mg  20 mg Oral Daily Arman Bogus, MD       And   hydrochlorothiazide (HYDRODIURIL) tablet 25 mg  25 mg Oral Daily Arman Bogus, MD   25 mg at 02/20/23 1038   HYDROcodone-acetaminophen (NORCO) 10-325 MG per tablet 1 tablet  1 tablet Oral Q4H PRN Arman Bogus, MD   1 tablet at 02/20/23 1037   insulin aspart (novoLOG) injection 0-15 Units  0-15 Units Subcutaneous TID WC Arman Bogus, MD   3 Units at 02/20/23 1249   menthol-cetylpyridinium (CEPACOL) lozenge 3 mg  1 lozenge Oral PRN Arman Bogus, MD       Or   phenol [facility]) mouth spray 1 spray  1 spray Mouth/Throat PRN Arman Bogus, MD       metFORMIN (GLUCOPHAGE) tablet 1,000 mg  1,000 mg Oral Q breakfast Arman Bogus, MD   1,000 mg at 02/20/23 0981   And   metFORMIN (GLUCOPHAGE) tablet 500 mg  500 mg Oral Q supper Arman Bogus, MD   500 mg at 02/19/23 1809   methocarbamol (ROBAXIN) tablet 500 mg  500 mg Oral Q6H PRN Arman Bogus, MD   500 mg at 02/20/23 1914   Or   methocarbamol (ROBAXIN) injection 500 mg  500 mg Intravenous Q6H PRN Arman Bogus, MD       morphine (PF) 2 MG/ML injection 2 mg  2 mg Intravenous Q2H PRN Arman Bogus, MD       ondansetron [facility]) tablet 4 mg  4 mg Oral Q6H PRN Arman Bogus, MD       Or   ondansetron [facility]) injection 4 mg  4 mg Intravenous Q6H PRN Arman Bogus, MD       potassium chloride (KLOR-CON M) CR tablet 10 mEq  10 mEq Oral Daily Arman Bogus, MD   10 mEq at 02/20/23 1038   senna (SENOKOT) tablet 8.6 mg  1 tablet Oral BID Arman Bogus, MD   8.6 mg at 02/20/23 1038   sodium chloride flush (NS) 0.9 % injection 3 mL  3 mL Intravenous Q12H Arman Bogus, MD   3 mL at 02/20/23 1040   sodium chloride flush (NS) 0.9 % injection 3 mL  3 mL Intravenous PRN Arman Bogus, MD          Discharge Medications: Please see discharge summary for a list of discharge medications.  Relevant Imaging Results:  Relevant Lab Results:   Additional Information SS#: [id number]  Baldemar Lenis, LCSW

## 2023-02-20 NOTE — TOC Initial Note (Signed)
Transition of Care [facility]) - Initial/Assessment Note    Patient Details  Name: Cynthia Torres MRN: [id number] Date of Birth: [birth year]  Transition of Care [facility]) CM/SW Contact:    Baldemar Lenis, LCSW Phone Number: 02/20/2023, 7:45 PM  Clinical Narrative:        Patient agreeable to SNF, requesting [facility]. CSW completed referral and sent to [facility], awaiting response. Will need to start insurance authorization once SNF is determined.           Expected Discharge Plan: Skilled Nursing Facility Barriers to Discharge: Continued Medical Work up, English as a second language teacher   Patient Goals and CMS Choice Patient states their goals for this hospitalization and ongoing recovery are:: to get rehab at [facility].gov Compare Post Acute Care list provided to:: Patient Choice offered to / list presented to : Patient [place] ownership interest in [facility].provided to:: Patient    Expected Discharge Plan and Services     Post Acute Care Choice: Skilled Nursing Facility Living arrangements for the past 2 months: Single Family Home                                      Prior Living Arrangements/Services Living arrangements for the past 2 months: Single Family Home Lives with:: Adult Children Patient language and need for interpreter reviewed:: No Do you feel safe going back to the place where you live?: Yes      Need for Family Participation in Patient Care: No (Comment) Care giver support system in place?: No (comment)   Criminal Activity/Legal Involvement Pertinent to Current Situation/Hospitalization: No - Comment as needed  Activities of Daily Living   ADL Screening (condition at time of admission) Independently performs ADLs?: Yes (appropriate for developmental age) Is the patient deaf or have difficulty hearing?: No Does the patient have difficulty seeing, even when wearing glasses/contacts?: No Does the patient have difficulty concentrating,  remembering, or making decisions?: No  Permission Sought/Granted Permission sought to share information with : Facility Industrial/product designer granted to share information with : Yes, Verbal Permission Granted     Permission granted to share info w AGENCY: SNF        Emotional Assessment Appearance:: Appears stated age Attitude/Demeanor/Rapport: Engaged Affect (typically observed): Appropriate Orientation: : Oriented to Self, Oriented to Place, Oriented to Situation, Oriented to  Time Alcohol / Substance Use: Not Applicable Psych Involvement: No (comment)  Admission diagnosis:  S/P lumbar fusion [Z98.1] Patient Active Problem List   Diagnosis Date Noted   S/P lumbar fusion 02/19/2023   Sacroiliac joint pain 12/28/2021   Low back pain, unspecified 09/19/2021   Body mass index (BMI) 40.0-44.9, adult (HCC) 06/12/2021   Essential hypertension 06/12/2021   Gastroesophageal reflux disease 06/12/2021   Gout 06/12/2021   Hypercalcemia 06/12/2021   Hypercholesterolemia 06/12/2021   Irritable bowel syndrome with constipation 06/12/2021   Lumbar spinal stenosis 06/12/2021   Morbid obesity (HCC) 06/12/2021   Osteoarthritis 06/12/2021   Primary insomnia 06/12/2021   Type 2 diabetes mellitus with other specified complication (HCC) 06/12/2021   Primary hyperparathyroidism (HCC) 06/12/2021   Chronic pain 08/24/2020   Myofascial pain 08/24/2020   Sacroiliitis, not elsewhere classified (HCC) 08/24/2020   Sciatica 08/24/2020   Acquired spondylolisthesis 05/05/2020   Displacement of lumbar intervertebral disc without myelopathy 05/05/2020   Acquired ptosis of eyelid of both eyes 09/03/2019   Primary osteoarthritis of first carpometacarpal joint of  right hand 05/21/2019   Trigger middle finger of right hand 05/21/2019   Cranial neuritis 02/23/2018   PCP:  Thana Ates, MD Pharmacy:   [id number] - [place], [place] - [address] AT [facility] [address] &  [facility] [address] Phone: [phone] Fax: [phone]     Social Drivers of Health (SDOH) Social History: SDOH Screenings   Social Connections: Unknown (07/17/2021)   [facility] from [facility]  Tobacco Use: Low Risk  (02/19/2023)   SDOH Interventions:     Readmission Risk Interventions     No data to display

## 2023-02-20 NOTE — Anesthesia Postprocedure Evaluation (Signed)
Anesthesia Post Note  Patient: Cynthia Torres  Procedure(s) Performed: POSTERIOR LUMBAR INTERBODY FUSION LUMBAR THREE-FOUR/LUMBAR FOUR-FIVE POSTERIOR LATERAL AND INTERBODY FUSION (Back)     Patient location during evaluation: PACU Anesthesia Type: General Level of consciousness: awake and alert Pain management: pain level controlled Vital Signs Assessment: post-procedure vital signs reviewed and stable Respiratory status: spontaneous breathing, nonlabored ventilation, respiratory function stable and patient connected to nasal cannula oxygen Cardiovascular status: blood pressure returned to baseline and stable Postop Assessment: no apparent nausea or vomiting Anesthetic complications: no   No notable events documented.             Mariann Barter

## 2023-02-20 NOTE — Progress Notes (Signed)
She is doing well.  Has appropriate soreness.  No leg pain.  She is walking with a walker.  He has good strength.  Will plan on skilled nursing facility.

## 2023-02-20 NOTE — Evaluation (Signed)
Occupational Therapy Evaluation Patient Details Name: Cynthia Torres MRN: [id number] DOB: [birth year] Today's Date: 02/20/2023   History of Present Illness Pt is a 77 year old woman admitted on 12/18 for L3-4, L 4-5 PLIF. PMH: arthritis, DM, gout, HTN, HLD.   Clinical Impression   Pt is typically independent, drives and is the caregiver of an elderly client. She lives with her son who works during the day. Pt presents with post operative back soreness, impaired standing balance and difficulty recalling and implementing back precautions and posture. Pt requires RW and CGA for ambulation. She is heavily reliant on UEs for sit<>stand. Pt needs set up to max assist for ADLs. Patient will benefit from continued inpatient follow up therapy, <3 hours/day.        If plan is discharge home, recommend the following: A little help with walking and/or transfers;A lot of help with bathing/dressing/bathroom;Assistance with cooking/housework;Assist for transportation;Help with stairs or ramp for entrance    Functional Status Assessment  Patient has had a recent decline in their functional status and demonstrates the ability to make significant improvements in function in a reasonable and predictable amount of time.  Equipment Recommendations  BSC/3in1    Recommendations for Other Services       Precautions / Restrictions Precautions Precautions: Fall;Back Precaution Booklet Issued: Yes (comment) Required Braces or Orthoses: Spinal Brace Spinal Brace: Lumbar corset;Applied in sitting position Restrictions Weight Bearing Restrictions Per Provider Order: No      Mobility Bed Mobility               General bed mobility comments: NT    Transfers Overall transfer level: Needs assistance Equipment used: Rolling walker (2 wheels) Transfers: Sit to/from Stand Sit to Stand: Contact guard assist           General transfer comment: cues for technique, increased time, heavy reliance on  grab bar and arms of chair      Balance Overall balance assessment: Needs assistance   Sitting balance-Leahy Scale: Good       Standing balance-Leahy Scale: Poor                             ADL either performed or assessed with clinical judgement   ADL Overall ADL's : Needs assistance/impaired Eating/Feeding: Independent   Grooming: Contact guard assist;Standing;Wash/dry hands   Upper Body Bathing: Minimal assistance;Sitting   Lower Body Bathing: Maximal assistance;Sit to/from stand   Upper Body Dressing : Minimal assistance;Sitting   Lower Body Dressing: Maximal assistance;Sit to/from stand   Toilet Transfer: Contact guard assist;Ambulation;Rolling walker (2 wheels)   Toileting- Clothing Manipulation and Hygiene: Supervision/safety;Sitting/lateral lean       Functional mobility during ADLs: Contact guard assist;Rolling walker (2 wheels)       Vision Baseline Vision/History: 1 Wears glasses Ability to See in Adequate Light: 0 Adequate Patient Visual Report: No change from baseline       Perception         Praxis         Pertinent Vitals/Pain Pain Assessment Pain Assessment: Faces Faces Pain Scale: Hurts little more Pain Location: back Pain Descriptors / Indicators: Sore Pain Intervention(s): Monitored during session, Repositioned     Extremity/Trunk Assessment Upper Extremity Assessment Upper Extremity Assessment: Overall WFL for tasks assessed (intermittent intention tremor/incoordination)   Lower Extremity Assessment Lower Extremity Assessment: Defer to PT evaluation   Cervical / Trunk Assessment Cervical / Trunk Assessment: Back Surgery  Communication Communication Communication: No apparent difficulties   Cognition Arousal: Alert Behavior During Therapy: WFL for tasks assessed/performed Overall Cognitive Status: No family/caregiver present to determine baseline cognitive functioning                                  General Comments: requires repetition to generalize posture and back precautions, increased processing speed     General Comments       Exercises     Shoulder Instructions      Home Living Family/patient expects to be discharged to:: Skilled nursing facility Living Arrangements: Children (son) Available Help at Discharge: Family;Available PRN/intermittently (son works) Type of Home: House Home Access: Level entry     Home Layout: One level     Bathroom Shower/Tub: Producer, television/film/video: Standard     Home Equipment: Cane - single point          Prior Functioning/Environment Prior Level of Function : Independent/Modified Independent;Working/employed;Driving                        OT Problem List: Decreased strength;Impaired balance (sitting and/or standing);Decreased coordination;Decreased cognition;Pain;Decreased knowledge of precautions;Decreased knowledge of use of DME or AE      OT Treatment/Interventions: Self-care/ADL training;DME and/or AE instruction;Therapeutic activities;Patient/family education;Balance training    OT Goals(Current goals can be found in the care plan section) Acute Rehab OT Goals OT Goal Formulation: With patient Time For Goal Achievement: 03/06/23 Potential to Achieve Goals: Good ADL Goals Pt Will Perform Grooming: with modified independence;standing Pt Will Perform Lower Body Bathing: with modified independence;with adaptive equipment;sitting/lateral leans Pt Will Perform Lower Body Dressing: with modified independence;sit to/from stand;with adaptive equipment Pt Will Transfer to Toilet: with modified independence;bedside commode Pt Will Perform Toileting - Clothing Manipulation and hygiene: with modified independence;sit to/from stand Additional ADL Goal #1: Pt will generalize back precautions in ADLs and mobility. Additional ADL Goal #2: Pt will complete bed mobility mod I in preparation for ADLs.  OT  Frequency: Min 1X/week    Co-evaluation              AM-PAC OT "6 Clicks" Daily Activity     Outcome Measure Help from another person eating meals?: None Help from another person taking care of personal grooming?: A Little Help from another person toileting, which includes using toliet, bedpan, or urinal?: A Little Help from another person bathing (including washing, rinsing, drying)?: A Lot Help from another person to put on and taking off regular upper body clothing?: A Little Help from another person to put on and taking off regular lower body clothing?: A Lot 6 Click Score: 17   End of Session Equipment Utilized During Treatment: Rolling walker (2 wheels);Back brace Nurse Communication: Mobility status  Activity Tolerance: Patient tolerated treatment well Patient left: in chair;with call bell/phone within reach  OT Visit Diagnosis: Unsteadiness on feet (R26.81);Other abnormalities of gait and mobility (R26.89);Pain;Muscle weakness (generalized) (M62.81);Other symptoms and signs involving cognitive function                Time: 1610-9604 OT Time Calculation (min): 16 min Charges:  OT General Charges $OT Visit: 1 Visit OT Evaluation $OT Eval Moderate Complexity: 1 Mod  Berna Spare, OTR/L Acute Rehabilitation Services Office: [phone]   Evern Bio 02/20/2023, 9:37 AM

## 2023-02-21 LAB — GLUCOSE, CAPILLARY
Glucose-Capillary: 106 mg/dL — ABNORMAL HIGH (ref 70–99)
Glucose-Capillary: 165 mg/dL — ABNORMAL HIGH (ref 70–99)
Glucose-Capillary: 128 mg/dL — ABNORMAL HIGH (ref 70–99)
Glucose-Capillary: 127 mg/dL — ABNORMAL HIGH (ref 70–99)

## 2023-02-21 MED ORDER — INSULIN ASPART 100 UNIT/ML IJ SOLN: 0.0000 [IU] | Freq: Every day | INTRAMUSCULAR | Status: DC

## 2023-02-21 MED ORDER — METHOCARBAMOL 750 MG PO TABS: 750.0000 mg | ORAL_TABLET | Freq: Four times a day (QID) | ORAL | 0 refills | Status: DC

## 2023-02-21 MED ORDER — HYDROCODONE-ACETAMINOPHEN 5-325 MG PO TABS: 1.0000 | ORAL_TABLET | Freq: Four times a day (QID) | ORAL | 0 refills | Status: DC | PRN

## 2023-02-21 NOTE — Plan of Care (Signed)
Problem: Education: Goal: Knowledge of General Education information will improve Description: Including pain rating scale, medication(s)/side effects and non-pharmacologic comfort measures 02/21/2023 1811 by Vincente Liberty, RN Outcome: Completed/Met 02/21/2023 1720 by Vincente Liberty, RN Outcome: Progressing   Problem: Health Behavior/Discharge Planning: Goal: Ability to manage health-related needs will improve 02/21/2023 1811 by Vincente Liberty, RN Outcome: Completed/Met 02/21/2023 1720 by Vincente Liberty, RN Outcome: Progressing   Problem: Clinical Measurements: Goal: Ability to maintain clinical measurements within normal limits will improve 02/21/2023 1811 by Vincente Liberty, RN Outcome: Completed/Met 02/21/2023 1720 by Vincente Liberty, RN Outcome: Progressing Goal: Will remain free from infection 02/21/2023 1811 by Vincente Liberty, RN Outcome: Completed/Met 02/21/2023 1720 by Vincente Liberty, RN Outcome: Progressing Goal: Diagnostic test results will improve 02/21/2023 1811 by Vincente Liberty, RN Outcome: Completed/Met 02/21/2023 1720 by Vincente Liberty, RN Outcome: Progressing Goal: Respiratory complications will improve 02/21/2023 1811 by Vincente Liberty, RN Outcome: Completed/Met 02/21/2023 1720 by Vincente Liberty, RN Outcome: Progressing Goal: Cardiovascular complication will be avoided 02/21/2023 1811 by Vincente Liberty, RN Outcome: Completed/Met 02/21/2023 1720 by Vincente Liberty, RN Outcome: Progressing   Problem: Activity: Goal: Risk for activity intolerance will decrease 02/21/2023 1811 by Vincente Liberty, RN Outcome: Completed/Met 02/21/2023 1720 by Vincente Liberty, RN Outcome: Progressing   Problem: Nutrition: Goal: Adequate nutrition will be maintained 02/21/2023 1811 by Vincente Liberty, RN Outcome: Completed/Met 02/21/2023 1720 by Vincente Liberty, RN Outcome: Progressing   Problem: Coping: Goal: Level of anxiety will  decrease 02/21/2023 1811 by Vincente Liberty, RN Outcome: Completed/Met 02/21/2023 1720 by Vincente Liberty, RN Outcome: Progressing   Problem: Elimination: Goal: Will not experience complications related to bowel motility 02/21/2023 1811 by Vincente Liberty, RN Outcome: Completed/Met 02/21/2023 1720 by Vincente Liberty, RN Outcome: Progressing Goal: Will not experience complications related to urinary retention 02/21/2023 1811 by Vincente Liberty, RN Outcome: Completed/Met 02/21/2023 1720 by Vincente Liberty, RN Outcome: Progressing   Problem: Pain Management: Goal: General experience of comfort will improve 02/21/2023 1811 by Vincente Liberty, RN Outcome: Completed/Met 02/21/2023 1720 by Vincente Liberty, RN Outcome: Progressing   Problem: Safety: Goal: Ability to remain free from injury will improve 02/21/2023 1811 by Vincente Liberty, RN Outcome: Completed/Met 02/21/2023 1720 by Vincente Liberty, RN Outcome: Progressing   Problem: Skin Integrity: Goal: Risk for impaired skin integrity will decrease 02/21/2023 1811 by Vincente Liberty, RN Outcome: Completed/Met 02/21/2023 1720 by Vincente Liberty, RN Outcome: Progressing   Problem: Education: Goal: Ability to describe self-care measures that may prevent or decrease complications (Diabetes Survival Skills Education) will improve 02/21/2023 1811 by Vincente Liberty, RN Outcome: Completed/Met 02/21/2023 1720 by Vincente Liberty, RN Outcome: Progressing Goal: Individualized Educational Video(s) 02/21/2023 1811 by Vincente Liberty, RN Outcome: Completed/Met 02/21/2023 1720 by Vincente Liberty, RN Outcome: Progressing   Problem: Coping: Goal: Ability to adjust to condition or change in health will improve 02/21/2023 1811 by Vincente Liberty, RN Outcome: Completed/Met 02/21/2023 1720 by Vincente Liberty, RN Outcome: Progressing   Problem: Fluid Volume: Goal: Ability to maintain a balanced intake and output will  improve 02/21/2023 1811 by Vincente Liberty, RN Outcome: Completed/Met 02/21/2023 1720 by Vincente Liberty, RN Outcome: Progressing   Problem: Health Behavior/Discharge Planning: Goal: Ability to identify and utilize available resources and services will improve 02/21/2023 1811 by Vincente Liberty, RN Outcome: Completed/Met 02/21/2023 1720 by Vincente Liberty, RN Outcome: Progressing Goal: Ability to manage health-related needs will improve 02/21/2023 1811 by Vincente Liberty, RN Outcome: Completed/Met 02/21/2023 1720 by Vincente Liberty, RN Outcome: Progressing   Problem: Metabolic: Goal: Ability to maintain appropriate glucose levels will improve  02/21/2023 1811 by Vincente Liberty, RN Outcome: Completed/Met 02/21/2023 1720 by Vincente Liberty, RN Outcome: Progressing   Problem: Nutritional: Goal: Maintenance of adequate nutrition will improve 02/21/2023 1811 by Vincente Liberty, RN Outcome: Completed/Met 02/21/2023 1720 by Vincente Liberty, RN Outcome: Progressing Goal: Progress toward achieving an optimal weight will improve 02/21/2023 1811 by Vincente Liberty, RN Outcome: Completed/Met 02/21/2023 1720 by Vincente Liberty, RN Outcome: Progressing   Problem: Skin Integrity: Goal: Risk for impaired skin integrity will decrease 02/21/2023 1811 by Vincente Liberty, RN Outcome: Completed/Met 02/21/2023 1720 by Vincente Liberty, RN Outcome: Progressing   Problem: Tissue Perfusion: Goal: Adequacy of tissue perfusion will improve 02/21/2023 1811 by Vincente Liberty, RN Outcome: Completed/Met 02/21/2023 1720 by Vincente Liberty, RN Outcome: Progressing   Problem: Education: Goal: Ability to verbalize activity precautions or restrictions will improve 02/21/2023 1811 by Vincente Liberty, RN Outcome: Completed/Met 02/21/2023 1720 by Vincente Liberty, RN Outcome: Progressing Goal: Knowledge of the prescribed therapeutic regimen will improve 02/21/2023 1811 by Vincente Liberty, RN Outcome: Completed/Met 02/21/2023 1720 by Vincente Liberty, RN Outcome: Progressing Goal: Understanding of discharge needs will improve 02/21/2023 1811 by Vincente Liberty, RN Outcome: Completed/Met 02/21/2023 1720 by Vincente Liberty, RN Outcome: Progressing   Problem: Activity: Goal: Ability to avoid complications of mobility impairment will improve 02/21/2023 1811 by Vincente Liberty, RN Outcome: Completed/Met 02/21/2023 1720 by Vincente Liberty, RN Outcome: Progressing Goal: Ability to tolerate increased activity will improve 02/21/2023 1811 by Vincente Liberty, RN Outcome: Completed/Met 02/21/2023 1720 by Vincente Liberty, RN Outcome: Progressing Goal: Will remain free from falls 02/21/2023 1811 by Vincente Liberty, RN Outcome: Completed/Met 02/21/2023 1720 by Vincente Liberty, RN Outcome: Progressing   Problem: Bowel/Gastric: Goal: Gastrointestinal status for postoperative course will improve 02/21/2023 1811 by Vincente Liberty, RN Outcome: Completed/Met 02/21/2023 1720 by Vincente Liberty, RN Outcome: Progressing   Problem: Clinical Measurements: Goal: Ability to maintain clinical measurements within normal limits will improve 02/21/2023 1811 by Vincente Liberty, RN Outcome: Completed/Met 02/21/2023 1720 by Vincente Liberty, RN Outcome: Progressing Goal: Postoperative complications will be avoided or minimized 02/21/2023 1811 by Vincente Liberty, RN Outcome: Completed/Met 02/21/2023 1720 by Vincente Liberty, RN Outcome: Progressing Goal: Diagnostic test results will improve 02/21/2023 1811 by Vincente Liberty, RN Outcome: Completed/Met 02/21/2023 1720 by Vincente Liberty, RN Outcome: Progressing   Problem: Pain Management: Goal: Pain level will decrease 02/21/2023 1811 by Vincente Liberty, RN Outcome: Completed/Met 02/21/2023 1720 by Vincente Liberty, RN Outcome: Progressing   Problem: Skin Integrity: Goal: Will show signs of wound  healing 02/21/2023 1811 by Vincente Liberty, RN Outcome: Completed/Met 02/21/2023 1720 by Vincente Liberty, RN Outcome: Progressing   Problem: Health Behavior/Discharge Planning: Goal: Identification of resources available to assist in meeting health care needs will improve 02/21/2023 1811 by Vincente Liberty, RN Outcome: Completed/Met 02/21/2023 1720 by Vincente Liberty, RN Outcome: Progressing

## 2023-02-21 NOTE — Progress Notes (Signed)
Physical Therapy Treatment Patient Details Name: Cynthia Torres MRN: [id number] DOB: Oct 18, 1945 Today's Date: 02/21/2023   History of Present Illness Pt is a 77 y/o female who presents s/p L3-L5 PLIF on 02/19/2023. PMH significant for arthritis, DM, gout, HTN.    PT Comments  The pt was seen for continued mobility training, was able to make good progress with ambulation distance this morning, but continues to need cues for posture, RW proximity, and takes many standing rest breaks with longer walking distance. The pt will continue to benefit from skilled PT to progress functional strength, power, endurance, and dynamic stability to reduce risk of falls and improve independence after surgery.    If plan is discharge home, recommend the following: A little help with walking and/or transfers;A little help with bathing/dressing/bathroom;Assistance with cooking/housework;Assist for transportation;Help with stairs or ramp for entrance   Can travel by private vehicle     Yes  Equipment Recommendations  Rolling walker (2 wheels)    Recommendations for Other Services       Precautions / Restrictions Precautions Precautions: Fall;Back Precaution Booklet Issued: Yes (comment) Precaution Comments: Reviewed handout and pt was cued for precautions during functional mobility. Required Braces or Orthoses: Spinal Brace Spinal Brace: Lumbar corset;Applied in sitting position Restrictions Weight Bearing Restrictions Per Provider Order: No     Mobility  Bed Mobility Overal bed mobility: Needs Assistance Bed Mobility: Rolling, Sidelying to Sit Rolling: Contact guard assist Sidelying to sit: Contact guard assist       General bed mobility comments: Close guard for safety and VC's for log roll technique. HOB elevated and use of rails required.    Transfers Overall transfer level: Needs assistance Equipment used: Rolling walker (2 wheels) Transfers: Sit to/from Stand Sit to Stand: Contact  guard assist           General transfer comment: VC's for hand placement on seated surface for safety. No assist required but close guard and increased time provided to achieve full stand.    Ambulation/Gait Ambulation/Gait assistance: Contact guard assist Gait Distance (Feet): 250 Feet Assistive device: Rolling walker (2 wheels) Gait Pattern/deviations: Step-through pattern, Decreased stride length, Trunk flexed Gait velocity: Decreased     General Gait Details: VC's for improved posture, closer walker proximity and forward gaze. Slow and effortful gait pattern. taking multiple stading breaks     Balance Overall balance assessment: Needs assistance Sitting-balance support: Feet supported, No upper extremity supported Sitting balance-Leahy Scale: Good     Standing balance support: Bilateral upper extremity supported, During functional activity, Reliant on assistive device for balance Standing balance-Leahy Scale: Poor Standing balance comment: dynamically                            Cognition Arousal: Alert Behavior During Therapy: WFL for tasks assessed/performed Overall Cognitive Status: No family/caregiver present to determine baseline cognitive functioning                                             General Comments General comments (skin integrity, edema, etc.): VSS on RA      Pertinent Vitals/Pain Pain Assessment Pain Assessment: 0-10 Pain Score: 3  Faces Pain Scale: Hurts a little bit Pain Location: back Pain Descriptors / Indicators: Sore, Operative site guarding Pain Intervention(s): Monitored during session, Limited activity within patient's tolerance  PT Goals (current goals can now be found in the care plan section) Acute Rehab PT Goals Patient Stated 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 PT Goal Formulation: With patient Time For Goal Achievement: 03/06/23 Potential to Achieve Goals:  Good Progress towards PT goals: Progressing toward goals    Frequency    Min 5X/week       AM-PAC PT "6 Clicks" Mobility   Outcome Measure  Help needed turning from your back to your side while in a flat bed without using bedrails?: A Little Help needed moving from lying on your back to sitting on the side of a flat bed without using bedrails?: A Little Help needed moving to and from a bed to a chair (including a wheelchair)?: A Little Help needed standing up from a chair using your arms (e.g., wheelchair or bedside chair)?: A Little Help needed to walk in hospital room?: A Little Help needed climbing 3-5 steps with a railing? : A Lot 6 Click Score: 17    End of Session Equipment Utilized During Treatment: Gait belt;Back brace Activity Tolerance: Patient tolerated treatment well Patient left: in chair;with call bell/phone within reach Nurse Communication: Mobility status PT Visit Diagnosis: Unsteadiness on feet (R26.81);Pain Pain - part of body:  (back)     Time: 4098-1191 PT Time Calculation (min) (ACUTE ONLY): 23 min  Charges:    $Gait Training: 8-22 mins $Therapeutic Exercise: 8-22 mins PT General Charges $$ ACUTE PT VISIT: 1 Visit                     Vickki Muff, PT, DPT   Acute Rehabilitation Department Office [phone] Secure Chat Communication Preferred   Ronnie Derby 02/21/2023, 10:51 AM

## 2023-02-21 NOTE — Progress Notes (Signed)
Doing well, not much soreness, no leg pain or NTW, she is pleased. PT/OT/ await SNF

## 2023-02-21 NOTE — Plan of Care (Signed)
  Problem: Education: Goal: Knowledge of General Education information will improve Description: Including pain rating scale, medication(s)/side effects and non-pharmacologic comfort measures Outcome: Progressing   Problem: Health Behavior/Discharge Planning: Goal: Ability to manage health-related needs will improve Outcome: Progressing   Problem: Clinical Measurements: Goal: Ability to maintain clinical measurements within normal limits will improve Outcome: Progressing Goal: Will remain free from infection Outcome: Progressing Goal: Diagnostic test results will improve Outcome: Progressing Goal: Respiratory complications will improve Outcome: Progressing Goal: Cardiovascular complication will be avoided Outcome: Progressing   Problem: Activity: Goal: Risk for activity intolerance will decrease Outcome: Progressing   Problem: Nutrition: Goal: Adequate nutrition will be maintained Outcome: Progressing   Problem: Coping: Goal: Level of anxiety will decrease Outcome: Progressing   Problem: Elimination: Goal: Will not experience complications related to bowel motility Outcome: Progressing Goal: Will not experience complications related to urinary retention Outcome: Progressing   Problem: Pain Management: Goal: General experience of comfort will improve Outcome: Progressing   Problem: Safety: Goal: Ability to remain free from injury will improve Outcome: Progressing   Problem: Skin Integrity: Goal: Risk for impaired skin integrity will decrease Outcome: Progressing   Problem: Education: Goal: Ability to describe self-care measures that may prevent or decrease complications (Diabetes Survival Skills Education) will improve Outcome: Progressing Goal: Individualized Educational Video(s) Outcome: Progressing   Problem: Coping: Goal: Ability to adjust to condition or change in health will improve Outcome: Progressing   Problem: Fluid Volume: Goal: Ability to  maintain a balanced intake and output will improve Outcome: Progressing   Problem: Health Behavior/Discharge Planning: Goal: Ability to identify and utilize available resources and services will improve Outcome: Progressing Goal: Ability to manage health-related needs will improve Outcome: Progressing   Problem: Metabolic: Goal: Ability to maintain appropriate glucose levels will improve Outcome: Progressing   Problem: Nutritional: Goal: Maintenance of adequate nutrition will improve Outcome: Progressing Goal: Progress toward achieving an optimal weight will improve Outcome: Progressing   Problem: Skin Integrity: Goal: Risk for impaired skin integrity will decrease Outcome: Progressing   Problem: Tissue Perfusion: Goal: Adequacy of tissue perfusion will improve Outcome: Progressing   Problem: Education: Goal: Ability to verbalize activity precautions or restrictions will improve Outcome: Progressing Goal: Knowledge of the prescribed therapeutic regimen will improve Outcome: Progressing Goal: Understanding of discharge needs will improve Outcome: Progressing   Problem: Activity: Goal: Ability to avoid complications of mobility impairment will improve Outcome: Progressing Goal: Ability to tolerate increased activity will improve Outcome: Progressing Goal: Will remain free from falls Outcome: Progressing   Problem: Bowel/Gastric: Goal: Gastrointestinal status for postoperative course will improve Outcome: Progressing   Problem: Clinical Measurements: Goal: Ability to maintain clinical measurements within normal limits will improve Outcome: Progressing Goal: Postoperative complications will be avoided or minimized Outcome: Progressing Goal: Diagnostic test results will improve Outcome: Progressing   Problem: Pain Management: Goal: Pain level will decrease Outcome: Progressing   Problem: Skin Integrity: Goal: Will show signs of wound healing Outcome:  Progressing   Problem: Health Behavior/Discharge Planning: Goal: Identification of resources available to assist in meeting health care needs will improve Outcome: Progressing

## 2023-02-21 NOTE — TOC Progression Note (Addendum)
Transition of Care [facility]) - Progression Note    Patient Details  Name: Cynthia Torres MRN: [id number] Date of Birth: [birth year]  Transition of Care [facility]) CM/SW Contact  Nicanor Bake Phone Number: [phone] 02/21/2023, 10:09 AM  Clinical Narrative:   Pt was accepted at [facility] SNF, which is the pts choice. Auth started and pending.  AUTH REFERENCE: [id number].  CSW received a call from [place] at [facility]. Who stated that a peer to peer may be needed. Sent for additional review because pt is ambulating 175 ft. Stated that pt may be more appropriate for [facility] vs SNF. Will call and follow up.   TOC will continue following.     Expected Discharge Plan: Skilled Nursing Facility Barriers to Discharge: Continued Medical Work up, English as a second language teacher  Expected Discharge Plan and Services     Post Acute Care Choice: Skilled Nursing Facility Living arrangements for the past 2 months: Single Family Home                                       Social Determinants of Health (SDOH) Interventions SDOH Screenings   Social Connections: Unknown (07/17/2021)   Received from [facility]  Tobacco Use: Low Risk  (02/19/2023)    Readmission Risk Interventions     No data to display

## 2023-02-22 ENCOUNTER — Other Ambulatory Visit (HOSPITAL_COMMUNITY): Payer: Self-pay

## 2023-02-22 LAB — GLUCOSE, CAPILLARY: Glucose-Capillary: 128 mg/dL — ABNORMAL HIGH (ref 70–99)

## 2023-02-22 MED ORDER — HYDROCODONE-ACETAMINOPHEN 5-325 MG PO TABS
1.0000 | ORAL_TABLET | Freq: Four times a day (QID) | ORAL | 0 refills | Status: DC | PRN
  Filled 2023-02-22: qty 30, 8d supply, fill #0

## 2023-02-22 MED ORDER — METHOCARBAMOL 750 MG PO TABS
750.0000 mg | ORAL_TABLET | Freq: Four times a day (QID) | ORAL | 0 refills | Status: DC
  Filled 2023-02-22: qty 120, 30d supply, fill #0

## 2023-02-22 NOTE — TOC Transition Note (Signed)
Transition of Care [facility]) - Discharge Note   Patient Details  Name: Cynthia Torres MRN: [id number] Date of Birth: 15-Mar-1945  Transition of Care [facility]) CM/SW Contact:  Lawerance Sabal, RN Phone Number: 02/22/2023, 11:09 AM   Clinical Narrative:     [name] w patient and daughter at bedside. They are agreeable to [facility] services.  No preference for provider and [name] is able to service. Unit staff will supply any DME needed.  No other TOC needs identified at his time.   Final next level of care: Home w Home Health Services Barriers to Discharge: No Barriers Identified   Patient Goals and CMS Choice Patient states their goals for this hospitalization and ongoing recovery are:: patient states she will go home w family CMS Medicare.gov Compare Post Acute Care list provided to:: Patient Choice offered to / list presented to : Patient Commerce ownership interest in [facility].provided to:: Patient    Discharge Placement                       Discharge Plan and Services Additional resources added to the After Visit Summary for       Post Acute Care Choice: Skilled Nursing Facility                    HH Arranged: PT, OT [facility] Agency: [facility] Date [facility] Agency Contacted: 02/22/23 Time HH Agency Contacted: [id number] Representative spoke with at [facility] Agency: Amy  Social Drivers of Health (SDOH) Interventions SDOH Screenings   Social Connections: Unknown (07/17/2021)   Received from [facility]  Tobacco Use: Low Risk  (02/19/2023)     Readmission Risk Interventions     No data to display

## 2023-02-22 NOTE — Progress Notes (Signed)
Physical Therapy Treatment  Patient Details Name: Cynthia Torres MRN: [id number] DOB: 1945/05/12 Today's Date: 02/22/2023   History of Present Illness Pt is a 77 y/o female who presents s/p L3-L5 PLIF on 02/19/2023. PMH significant for arthritis, DM, gout, HTN.    PT Comments  Pt progressing slowly towards physical therapy goals.  Daughter present with pt and reinforced education with them both on precautions, brace application/wearing schedule, car transfer, activity progression, and safety with mobility within the home environment. Recommend pt get a tray for her walker so she can move food from the countertop to the table when home alone and managing meals. Recommend BSC use by her lift chair when home alone to minimize unsupervised mobility. Pt continues to be at a high risk for falls and would benefit most from post-acute therapy at the SNF level, however noted insurance denial. Pt anticipates d/c home today.    If plan is discharge home, recommend the following: A little help with walking and/or transfers;A little help with bathing/dressing/bathroom;Assistance with cooking/housework;Assist for transportation;Help with stairs or ramp for entrance   Can travel by private vehicle     Yes  Equipment Recommendations  Rolling walker (2 wheels)    Recommendations for Other Services       Precautions / Restrictions Precautions Precautions: Fall;Back Precaution Booklet Issued: Yes (comment) Precaution Comments: Reviewed handout and pt was cued for precautions during functional mobility. Required Braces or Orthoses: Spinal Brace Spinal Brace: Lumbar corset Restrictions Weight Bearing Restrictions Per Provider Order: No     Mobility  Bed Mobility               General bed mobility comments: Pt was received sitting up EOB when PT arrived.    Transfers Overall transfer level: Needs assistance Equipment used: Rolling walker (2 wheels) Transfers: Sit to/from Stand Sit to  Stand: Min assist           General transfer comment: Increased time and assist required to power up to full stand. Pt twisting during transfers to/from stand and unable to make corrective changes with cues.    Ambulation/Gait Ambulation/Gait assistance: Min assist Gait Distance (Feet): 150 Feet Assistive device: Rolling walker (2 wheels) Gait Pattern/deviations: Step-through pattern, Decreased stride length, Trunk flexed Gait velocity: Decreased Gait velocity interpretation: <1.31 ft/sec, indicative of household ambulator   General Gait Details: Multimodal cues throughout for improved posture, closer walker proximity and forward gaze. Slow and effortful gait pattern. Occasional assist required for walker management.   Stairs             Wheelchair Mobility     Tilt Bed    Modified Rankin (Stroke Patients Only)       Balance Overall balance assessment: Needs assistance Sitting-balance support: Feet supported, No upper extremity supported Sitting balance-Leahy Scale: Fair Sitting balance - Comments: leans posteriorly and to R when fatigued   Standing balance support: Bilateral upper extremity supported, During functional activity, Reliant on assistive device for balance Standing balance-Leahy Scale: Poor Standing balance comment: dynamically                            Cognition Arousal: Alert Behavior During Therapy: WFL for tasks assessed/performed Overall Cognitive Status: No family/caregiver present to determine baseline cognitive functioning                                 General Comments:  Multimodal cues for improved posture and maintenance of precautions.        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 PT Goal Formulation: With patient Time For Goal Achievement: 03/06/23 Potential to Achieve Goals: Good Progress towards PT goals: Progressing toward goals    Frequency    Min 5X/week      PT Plan      Co-evaluation              AM-PAC PT "6 Clicks" Mobility   Outcome Measure  Help needed turning from your back to your side while in a flat bed without using bedrails?: A Little Help needed moving from lying on your back to sitting on the side of a flat bed without using bedrails?: A Little Help needed moving to and from a bed to a chair (including a wheelchair)?: A Little Help needed standing up from a chair using your arms (e.g., wheelchair or bedside chair)?: A Little Help needed to walk in hospital room?: A Little Help needed climbing 3-5 steps with a railing? : A Lot 6 Click Score: 17    End of Session Equipment Utilized During Treatment: Gait belt;Back brace Activity Tolerance: Patient tolerated treatment well Patient left: in chair;with call bell/phone within reach Nurse Communication: Mobility status PT Visit Diagnosis: Unsteadiness on feet (R26.81);Pain Pain - part of body:  (back)     Time: 1020-1040 PT Time Calculation (min) (ACUTE ONLY): 20 min  Charges:    $Gait Training: 8-22 mins PT General Charges $$ ACUTE PT VISIT: 1 Visit                     Conni Slipper, PT, DPT Acute Rehabilitation Services Secure Chat Preferred Office: [phone]    Cynthia Torres 02/22/2023, 10:54 AM

## 2023-02-22 NOTE — Progress Notes (Signed)
Occupational Therapy Treatment Patient Details Name: Cynthia Torres MRN: [id number] DOB: 02-10-1946 Today's Date: 02/22/2023   History of present illness Pt is a 77 y/o female who presents s/p L3-L5 PLIF on 02/19/2023. PMH significant for arthritis, DM, gout, HTN.   OT comments  Pt making slow progress towards goals, needs incr cues for back precautions and up to mod A for ADLs, CGA for transfers and bed mobility. Pt educated on AE for LB ADLs and provided at end of session. Pt needs incr time and cues for sequencing. Reviewed back precautions with pt and compensatory strategies for ADLs/mobility. Pt presenting with impairments listed below, will follow acutely. Patient will benefit from continued inpatient follow up therapy, <3 hours/day, however if unable, will benefit from [facility] at d/c.        If plan is discharge home, recommend the following:  A little help with walking and/or transfers;A lot of help with bathing/dressing/bathroom;Assistance with cooking/housework;Assist for transportation;Help with stairs or ramp for entrance   Equipment Recommendations  BSC/3in1    Recommendations for Other Services      Precautions / Restrictions Precautions Precautions: Fall;Back Precaution Booklet Issued: Yes (comment) Precaution Comments: Reviewed handout and pt was cued for precautions during functional mobility. Required Braces or Orthoses: Spinal Brace Spinal Brace: Lumbar corset Restrictions Weight Bearing Restrictions Per Provider Order: No       Mobility Bed Mobility Overal bed mobility: Needs Assistance Bed Mobility: Rolling, Sidelying to Sit Rolling: Contact guard assist Sidelying to sit: Contact guard assist       General bed mobility comments: incr time and cues for sequencing    Transfers Overall transfer level: Needs assistance Equipment used: Rolling walker (2 wheels) Transfers: Sit to/from Stand Sit to Stand: Contact guard assist                  Balance Overall balance assessment: Needs assistance Sitting-balance support: Feet supported, No upper extremity supported Sitting balance-Leahy Scale: Fair Sitting balance - Comments: leans posteriorly and to R when fatigued   Standing balance support: Bilateral upper extremity supported, During functional activity, Reliant on assistive device for balance Standing balance-Leahy Scale: Poor Standing balance comment: dynamically                           ADL either performed or assessed with clinical judgement   ADL Overall ADL's : Needs assistance/impaired Eating/Feeding: Independent   Grooming: Contact guard assist;Standing;Wash/dry hands   Upper Body Bathing: Minimal assistance   Lower Body Bathing: Moderate assistance;With adaptive equipment   Upper Body Dressing : Minimal assistance   Lower Body Dressing: Moderate assistance;With adaptive equipment   Toilet Transfer: Contact guard assist   Toileting- Clothing Manipulation and Hygiene: Supervision/safety;Sitting/lateral lean       Functional mobility during ADLs: Contact guard assist;Rolling walker (2 wheels)      Extremity/Trunk Assessment Upper Extremity Assessment Upper Extremity Assessment: Generalized weakness (decr [facility])   Lower Extremity Assessment Lower Extremity Assessment: Defer to PT evaluation        Vision       Perception Perception Perception: Not tested   Praxis Praxis Praxis: Not tested    Cognition Arousal: Alert Behavior During Therapy: [facility] for tasks assessed/performed Overall Cognitive Status: No family/caregiver present to determine baseline cognitive functioning  General Comments: cues to recall back prec, incr time        Exercises      Shoulder Instructions       General Comments VSS on RA    Pertinent Vitals/ Pain       Pain Assessment Pain Assessment: Faces Pain Score: 2  Faces Pain Scale: Hurts a little  bit Pain Location: back Pain Descriptors / Indicators: Sore, Operative site guarding Pain Intervention(s): Limited activity within patient's tolerance, Monitored during session, Repositioned  Home Living                                          Prior Functioning/Environment              Frequency  Min 1X/week        Progress Toward Goals  OT Goals(current goals can now be found in the care plan section)  Progress towards OT goals: Progressing toward goals  Acute Rehab OT Goals OT Goal Formulation: With patient Time For Goal Achievement: 03/06/23 Potential to Achieve Goals: Good ADL Goals Pt Will Perform Grooming: with modified independence;standing Pt Will Perform Lower Body Bathing: with modified independence;with adaptive equipment;sitting/lateral leans Pt Will Perform Lower Body Dressing: with modified independence;sit to/from stand;with adaptive equipment Pt Will Transfer to Toilet: with modified independence;bedside commode Pt Will Perform Toileting - Clothing Manipulation and hygiene: with modified independence;sit to/from stand Additional ADL Goal #1: Pt will generalize back precautions in ADLs and mobility. Additional ADL Goal #2: Pt will complete bed mobility mod I in preparation for ADLs.  Plan      Co-evaluation                 AM-PAC OT "6 Clicks" Daily Activity     Outcome Measure   Help from another person eating meals?: A Little Help from another person taking care of personal grooming?: A Little Help from another person toileting, which includes using toliet, bedpan, or urinal?: A Little Help from another person bathing (including washing, rinsing, drying)?: A Lot Help from another person to put on and taking off regular upper body clothing?: A Little Help from another person to put on and taking off regular lower body clothing?: A Lot 6 Click Score: 16    End of Session Equipment Utilized During Treatment: Rolling walker  (2 wheels);Back brace  OT Visit Diagnosis: Unsteadiness on feet (R26.81);Other abnormalities of gait and mobility (R26.89);Pain;Muscle weakness (generalized) (M62.81);Other symptoms and signs involving cognitive function   Activity Tolerance Patient tolerated treatment well   Patient Left with call bell/phone within reach;in bed   Nurse Communication Mobility status        Time: 6962-9528 OT Time Calculation (min): 30 min  Charges: OT General Charges $OT Visit: 1 Visit OT Treatments $Self Care/Home Management : 23-37 mins  Carver Fila, OTD, OTR/L SecureChat Preferred Acute Rehab [phone]   Carver Fila Koonce 02/22/2023, 9:11 AM

## 2023-02-22 NOTE — Progress Notes (Signed)
Patient alert and oriented, mae's well, voiding adequate amount of urine, swallowing without difficulty, no c/o pain at time of discharge. Patient discharged home with family. Script and discharged instructions given to patient. Patient and family stated understanding of instructions given. Patient has an appointment with Dr. Lineback °

## 2023-02-22 NOTE — Discharge Summary (Signed)
Physician Discharge Summary  Patient ID: Cynthia Torres MRN: [id number] DOB/AGE: August 04, 1945 77 y.o.  Admit date: 02/19/2023 Discharge date: 02/22/2023  Admission Diagnoses:  Lumbar spondylosis/spondylolisthesis  Discharge Diagnoses:  Same Principal Problem:   S/P lumbar fusion   Discharged Condition: Stable  Hospital Course:  Cynthia Torres is a 77 y.o. female admitted after elective lumbar decompression and fusion L3-L5.  Patient was doing reasonably well postoperatively, ambulating with the assistance of rollator walker with physical and Occupational Therapy.  Pain was under control with oral medication.  While short-term skilled nursing facility placement was suggested, this was denied by insurance.  The patient was therefore comfortable going home with home health physical and Occupational Therapy.  Treatments: Surgery -L3-L5 decompression fusion  Discharge Exam: Blood pressure 118/64, pulse 78, temperature 98.3 F (36.8 C), temperature source Oral, resp. rate 19, height 4\' 11"  (1.499 m), weight 78 kg, SpO2 96%. Awake, alert, oriented Speech fluent, appropriate CN grossly intact 5/5 BUE/BLE Wound c/d/i  Disposition: Discharge disposition: 01-Home or Self Care       Discharge Instructions     Call MD for:  redness, tenderness, or signs of infection (pain, swelling, redness, odor or green/yellow discharge around incision site)   Complete by: As directed    Call MD for:  temperature >100.4   Complete by: As directed    Diet - low sodium heart healthy   Complete by: As directed    Discharge instructions   Complete by: As directed    Walk at home as much as possible, at least 4 times / day   Increase activity slowly   Complete by: As directed    Lifting restrictions   Complete by: As directed    No lifting > 10 lbs   May shower / Bathe   Complete by: As directed    48 hours after surgery   May walk up steps   Complete by: As directed    Other  Restrictions   Complete by: As directed    No bending/twisting at waist      Allergies as of 02/22/2023       Reactions   Atorvastatin    Other reaction(s): myalgias        Medication List     TAKE these medications    allopurinol 300 MG tablet Commonly known as: ZYLOPRIM   amLODipine 5 MG tablet Commonly known as: NORVASC Take 5 mg by mouth daily.   atenolol 100 MG tablet Commonly known as: TENORMIN Take 100 mg by mouth daily.   colchicine 0.6 MG tablet Take 0.6 mg by mouth daily.   furosemide 20 MG tablet Commonly known as: LASIX Take 20 mg by mouth daily.   HYDROcodone-acetaminophen 5-325 MG tablet Commonly known as: NORCO/VICODIN Take 1 tablet by mouth every 6 (six) hours as needed for moderate pain (pain score 4-6).   lisinopril-hydrochlorothiazide 20-25 MG tablet Commonly known as: ZESTORETIC Take 1 tablet by mouth daily.   meclizine 25 MG tablet Commonly known as: ANTIVERT Take 1 tablet (25 mg total) by mouth 3 (three) times daily as needed for dizziness.   metFORMIN 500 MG tablet Commonly known as: GLUCOPHAGE Take 500-1,000 mg by mouth in the morning, at noon, and at bedtime. 1,000mg  in AM and 500mg  in PM   methocarbamol 750 MG tablet Commonly known as: Robaxin-750 Take 1 tablet (750 mg total) by mouth 4 (four) times daily.   moxifloxacin 0.5 % ophthalmic solution Commonly known as: VIGAMOX Place 1 drop into  the right eye 4 (four) times daily.   ondansetron 4 MG tablet Commonly known as: ZOFRAN Take 1 tablet (4 mg total) by mouth every 6 (six) hours.   OneTouch Verio test strip Generic drug: glucose blood   potassium chloride 10 MEQ CR capsule Commonly known as: MICRO-K Take 10 mEq by mouth 2 (two) times daily.   rosuvastatin 10 MG tablet Commonly known as: CRESTOR Take 5 mg by mouth once.               Durable Medical Equipment  (From admission, onward)           Start     Ordered   02/19/23 1703  DME Walker rolling   Once       Question:  Patient needs a walker to treat with the following condition  Answer:  S/P lumbar fusion   02/19/23 1702   02/19/23 1703  DME 3 n 1  Once        02/19/23 1702            Follow-up Information     Arman Bogus, MD Follow up in 2 week(s).   Specialty: Neurosurgery Contact information: [address] [place] [address] [phone]                 Signed: Jackelyn Hoehn 02/22/2023, 9:52 AM

## 2023-02-22 NOTE — Discharge Instructions (Signed)
[facility]

## 2023-02-24 ENCOUNTER — Other Ambulatory Visit (HOSPITAL_COMMUNITY): Payer: Self-pay

## 2023-02-24 ENCOUNTER — Encounter (HOSPITAL_COMMUNITY): Payer: Self-pay | Admitting: Neurological Surgery

## 2023-03-18 DIAGNOSIS — M4316 Spondylolisthesis, lumbar region: Secondary | ICD-10-CM | POA: Diagnosis not present

## 2023-03-21 DIAGNOSIS — R21 Rash and other nonspecific skin eruption: Secondary | ICD-10-CM | POA: Diagnosis not present

## 2023-03-21 DIAGNOSIS — Z130 Encounter for screening for diseases of the blood and blood-forming organs and certain disorders involving the immune mechanism: Secondary | ICD-10-CM | POA: Diagnosis not present

## 2023-03-21 DIAGNOSIS — Z9889 Other specified postprocedural states: Secondary | ICD-10-CM | POA: Diagnosis not present

## 2023-03-21 DIAGNOSIS — R6889 Other general symptoms and signs: Secondary | ICD-10-CM | POA: Diagnosis not present

## 2023-03-21 DIAGNOSIS — E119 Type 2 diabetes mellitus without complications: Secondary | ICD-10-CM | POA: Diagnosis not present

## 2023-03-21 DIAGNOSIS — I10 Essential (primary) hypertension: Secondary | ICD-10-CM | POA: Diagnosis not present

## 2023-03-21 DIAGNOSIS — E559 Vitamin D deficiency, unspecified: Secondary | ICD-10-CM | POA: Diagnosis not present

## 2023-03-21 DIAGNOSIS — D649 Anemia, unspecified: Secondary | ICD-10-CM | POA: Diagnosis not present

## 2023-04-09 DIAGNOSIS — T84029A Dislocation of unspecified internal joint prosthesis, initial encounter: Secondary | ICD-10-CM | POA: Diagnosis not present

## 2023-04-09 DIAGNOSIS — S99912A Unspecified injury of left ankle, initial encounter: Secondary | ICD-10-CM | POA: Diagnosis not present

## 2023-04-09 DIAGNOSIS — W19XXXA Unspecified fall, initial encounter: Secondary | ICD-10-CM | POA: Diagnosis not present

## 2023-04-09 DIAGNOSIS — S79922A Unspecified injury of left thigh, initial encounter: Secondary | ICD-10-CM | POA: Diagnosis not present

## 2023-04-10 ENCOUNTER — Emergency Department (HOSPITAL_COMMUNITY): Payer: Medicare Other

## 2023-04-10 ENCOUNTER — Observation Stay (HOSPITAL_COMMUNITY): Payer: Medicare Other

## 2023-04-10 ENCOUNTER — Emergency Department (HOSPITAL_COMMUNITY): Payer: Medicare Other | Admitting: Anesthesiology

## 2023-04-10 ENCOUNTER — Encounter (HOSPITAL_COMMUNITY)
Admission: EM | Disposition: A | Discharge status: Skilled Nursing Facility | Payer: Self-pay | Source: Ambulatory Visit | Attending: Orthopedic Surgery

## 2023-04-10 ENCOUNTER — Encounter (HOSPITAL_COMMUNITY): Payer: Self-pay | Admitting: Emergency Medicine

## 2023-04-10 ENCOUNTER — Other Ambulatory Visit: Payer: Self-pay

## 2023-04-10 ENCOUNTER — Inpatient Hospital Stay (HOSPITAL_COMMUNITY)
Admission: EM | Admit: 2023-04-10 | Discharge: 2023-04-18 | DRG: 470 | Disposition: A | Discharge status: Skilled Nursing Facility | Payer: Medicare Other | Source: Ambulatory Visit | Attending: Internal Medicine | Admitting: Internal Medicine

## 2023-04-10 DIAGNOSIS — M25352 Other instability, left hip: Secondary | ICD-10-CM | POA: Diagnosis present

## 2023-04-10 DIAGNOSIS — S73005A Unspecified dislocation of left hip, initial encounter: Secondary | ICD-10-CM | POA: Diagnosis present

## 2023-04-10 DIAGNOSIS — Y792 Prosthetic and other implants, materials and accessory orthopedic devices associated with adverse incidents: Secondary | ICD-10-CM | POA: Diagnosis present

## 2023-04-10 DIAGNOSIS — E119 Type 2 diabetes mellitus without complications: Secondary | ICD-10-CM

## 2023-04-10 DIAGNOSIS — M109 Gout, unspecified: Secondary | ICD-10-CM | POA: Diagnosis not present

## 2023-04-10 DIAGNOSIS — M25552 Pain in left hip: Secondary | ICD-10-CM | POA: Diagnosis not present

## 2023-04-10 DIAGNOSIS — D62 Acute posthemorrhagic anemia: Secondary | ICD-10-CM | POA: Diagnosis not present

## 2023-04-10 DIAGNOSIS — Z471 Aftercare following joint replacement surgery: Secondary | ICD-10-CM | POA: Diagnosis not present

## 2023-04-10 DIAGNOSIS — Z981 Arthrodesis status: Secondary | ICD-10-CM | POA: Diagnosis not present

## 2023-04-10 DIAGNOSIS — Z79899 Other long term (current) drug therapy: Secondary | ICD-10-CM | POA: Diagnosis not present

## 2023-04-10 DIAGNOSIS — S73002A Unspecified subluxation of left hip, initial encounter: Principal | ICD-10-CM

## 2023-04-10 DIAGNOSIS — Z888 Allergy status to other drugs, medicaments and biological substances status: Secondary | ICD-10-CM

## 2023-04-10 DIAGNOSIS — E210 Primary hyperparathyroidism: Secondary | ICD-10-CM | POA: Diagnosis present

## 2023-04-10 DIAGNOSIS — I10 Essential (primary) hypertension: Secondary | ICD-10-CM

## 2023-04-10 DIAGNOSIS — Z90710 Acquired absence of both cervix and uterus: Secondary | ICD-10-CM

## 2023-04-10 DIAGNOSIS — D649 Anemia, unspecified: Secondary | ICD-10-CM | POA: Diagnosis not present

## 2023-04-10 DIAGNOSIS — N179 Acute kidney failure, unspecified: Secondary | ICD-10-CM | POA: Diagnosis not present

## 2023-04-10 DIAGNOSIS — Z6833 Body mass index (BMI) 33.0-33.9, adult: Secondary | ICD-10-CM

## 2023-04-10 DIAGNOSIS — I452 Bifascicular block: Secondary | ICD-10-CM | POA: Diagnosis not present

## 2023-04-10 DIAGNOSIS — Z7984 Long term (current) use of oral hypoglycemic drugs: Secondary | ICD-10-CM | POA: Diagnosis not present

## 2023-04-10 DIAGNOSIS — M47816 Spondylosis without myelopathy or radiculopathy, lumbar region: Secondary | ICD-10-CM | POA: Diagnosis not present

## 2023-04-10 DIAGNOSIS — W19XXXA Unspecified fall, initial encounter: Secondary | ICD-10-CM | POA: Diagnosis present

## 2023-04-10 DIAGNOSIS — T84021A Dislocation of internal left hip prosthesis, initial encounter: Secondary | ICD-10-CM | POA: Diagnosis not present

## 2023-04-10 DIAGNOSIS — E7849 Other hyperlipidemia: Secondary | ICD-10-CM | POA: Diagnosis not present

## 2023-04-10 DIAGNOSIS — K219 Gastro-esophageal reflux disease without esophagitis: Secondary | ICD-10-CM | POA: Diagnosis present

## 2023-04-10 DIAGNOSIS — Z96643 Presence of artificial hip joint, bilateral: Secondary | ICD-10-CM | POA: Diagnosis not present

## 2023-04-10 DIAGNOSIS — E669 Obesity, unspecified: Secondary | ICD-10-CM | POA: Diagnosis present

## 2023-04-10 DIAGNOSIS — M8580 Other specified disorders of bone density and structure, unspecified site: Secondary | ICD-10-CM | POA: Diagnosis not present

## 2023-04-10 DIAGNOSIS — E785 Hyperlipidemia, unspecified: Secondary | ICD-10-CM | POA: Insufficient documentation

## 2023-04-10 DIAGNOSIS — Z96642 Presence of left artificial hip joint: Secondary | ICD-10-CM | POA: Diagnosis not present

## 2023-04-10 DIAGNOSIS — T84031A Mechanical loosening of internal left hip prosthetic joint, initial encounter: Secondary | ICD-10-CM | POA: Diagnosis not present

## 2023-04-10 HISTORY — PX: HIP CLOSED REDUCTION: SHX983

## 2023-04-10 LAB — CBC WITH DIFFERENTIAL/PLATELET
Abs Immature Granulocytes: 0.05 10*3/uL (ref 0.00–0.07)
Basophils Absolute: 0.1 10*3/uL (ref 0.0–0.1)
Basophils Relative: 1 %
Eosinophils Absolute: 0.1 10*3/uL (ref 0.0–0.5)
Eosinophils Relative: 1 %
HCT: 32.7 % — ABNORMAL LOW (ref 36.0–46.0)
Hemoglobin: 10.7 g/dL — ABNORMAL LOW (ref 12.0–15.0)
Immature Granulocytes: 1 %
Lymphocytes Relative: 13 %
Lymphs Abs: 1.3 10*3/uL (ref 0.7–4.0)
MCH: 29.1 pg (ref 26.0–34.0)
MCHC: 32.7 g/dL (ref 30.0–36.0)
MCV: 88.9 fL (ref 80.0–100.0)
Monocytes Absolute: 0.6 10*3/uL (ref 0.1–1.0)
Monocytes Relative: 6 %
Neutro Abs: 7.7 10*3/uL (ref 1.7–7.7)
Neutrophils Relative %: 78 %
Platelets: 246 10*3/uL (ref 150–400)
RBC: 3.68 MIL/uL — ABNORMAL LOW (ref 3.87–5.11)
RDW: 13 % (ref 11.5–15.5)
WBC: 9.7 10*3/uL (ref 4.0–10.5)
nRBC: 0 % (ref 0.0–0.2)

## 2023-04-10 LAB — BASIC METABOLIC PANEL
Anion gap: 16 — ABNORMAL HIGH (ref 5–15)
BUN: 26 mg/dL — ABNORMAL HIGH (ref 8–23)
CO2: 19 mmol/L — ABNORMAL LOW (ref 22–32)
Calcium: 11.7 mg/dL — ABNORMAL HIGH (ref 8.9–10.3)
Chloride: 104 mmol/L (ref 98–111)
Creatinine, Ser: 1.2 mg/dL — ABNORMAL HIGH (ref 0.44–1.00)
GFR, Estimated: 47 mL/min — ABNORMAL LOW (ref >60)
Glucose, Bld: 134 mg/dL — ABNORMAL HIGH (ref 70–99)
Potassium: 3.7 mmol/L (ref 3.5–5.1)
Sodium: 139 mmol/L (ref 135–145)

## 2023-04-10 LAB — GLUCOSE, CAPILLARY
Glucose-Capillary: 104 mg/dL — ABNORMAL HIGH (ref 70–99)
Glucose-Capillary: 165 mg/dL — ABNORMAL HIGH (ref 70–99)

## 2023-04-10 LAB — CBG MONITORING, ED: Glucose-Capillary: 94 mg/dL (ref 70–99)

## 2023-04-10 SURGERY — CLOSED REDUCTION, HIP
Anesthesia: General | Site: Hip | Laterality: Left

## 2023-04-10 MED ORDER — FENTANYL CITRATE (PF) 100 MCG/2ML IJ SOLN
25.0000 ug | INTRAMUSCULAR | Status: DC | PRN
  Administered 2023-04-10: 50 ug via INTRAVENOUS
  Administered 2023-04-10: 25 ug via INTRAVENOUS

## 2023-04-10 MED ORDER — PANTOPRAZOLE SODIUM 40 MG PO TBEC
40.0000 mg | DELAYED_RELEASE_TABLET | Freq: Every day | ORAL | Status: DC
  Administered 2023-04-10 – 2023-04-18 (×8): 40 mg via ORAL
  Filled 2023-04-10 (×8): qty 1

## 2023-04-10 MED ORDER — PROPOFOL 10 MG/ML IV BOLUS
INTRAVENOUS | Status: AC | PRN
  Administered 2023-04-10: 20 ug via INTRAVENOUS

## 2023-04-10 MED ORDER — ONDANSETRON HCL 4 MG/2ML IJ SOLN: 4.0000 mg | Freq: Once | INTRAMUSCULAR | Status: DC | PRN

## 2023-04-10 MED ORDER — DEXAMETHASONE SODIUM PHOSPHATE 10 MG/ML IJ SOLN
INTRAMUSCULAR | Status: DC | PRN
  Administered 2023-04-10: 5 mg via INTRAVENOUS

## 2023-04-10 MED ORDER — SUCCINYLCHOLINE CHLORIDE 200 MG/10ML IV SOSY
PREFILLED_SYRINGE | INTRAVENOUS | Status: DC | PRN
  Administered 2023-04-10: 160 mg via INTRAVENOUS

## 2023-04-10 MED ORDER — MORPHINE SULFATE (PF) 4 MG/ML IV SOLN
4.0000 mg | Freq: Once | INTRAVENOUS | Status: AC
  Administered 2023-04-10: 4 mg via INTRAVENOUS
  Filled 2023-04-10: qty 1

## 2023-04-10 MED ORDER — ACETAMINOPHEN 10 MG/ML IV SOLN: 1000.0000 mg | Freq: Once | INTRAVENOUS | Status: DC | PRN

## 2023-04-10 MED ORDER — OXYCODONE HCL 5 MG PO TABS
5.0000 mg | ORAL_TABLET | ORAL | Status: DC | PRN
  Administered 2023-04-11 – 2023-04-14 (×13): 10 mg via ORAL
  Administered 2023-04-14: 5 mg via ORAL
  Administered 2023-04-15 – 2023-04-16 (×3): 10 mg via ORAL
  Filled 2023-04-10 (×18): qty 2
  Filled 2023-04-10: qty 1
  Filled 2023-04-10: qty 2

## 2023-04-10 MED ORDER — KETOROLAC TROMETHAMINE 15 MG/ML IJ SOLN
7.5000 mg | Freq: Four times a day (QID) | INTRAMUSCULAR | Status: AC
  Administered 2023-04-10 – 2023-04-11 (×4): 7.5 mg via INTRAVENOUS
  Filled 2023-04-10 (×4): qty 1

## 2023-04-10 MED ORDER — METHOCARBAMOL 1000 MG/10ML IJ SOLN
500.0000 mg | Freq: Four times a day (QID) | INTRAMUSCULAR | Status: DC | PRN
Start: 2023-04-10 — End: 2023-04-18

## 2023-04-10 MED ORDER — LACTATED RINGERS IV SOLN: INTRAVENOUS | Status: DC

## 2023-04-10 MED ORDER — PROPOFOL 10 MG/ML IV BOLUS
0.5000 mg/kg | Freq: Once | INTRAVENOUS | Status: AC
  Administered 2023-04-10: 40 mg via INTRAVENOUS
  Filled 2023-04-10: qty 20

## 2023-04-10 MED ORDER — FENTANYL CITRATE (PF) 100 MCG/2ML IJ SOLN: 25.0000 ug | INTRAMUSCULAR | Status: DC | PRN

## 2023-04-10 MED ORDER — FENTANYL CITRATE (PF) 250 MCG/5ML IJ SOLN
INTRAMUSCULAR | Status: AC
  Filled 2023-04-10: qty 5

## 2023-04-10 MED ORDER — ACETAMINOPHEN 325 MG PO TABS
325.0000 mg | ORAL_TABLET | Freq: Four times a day (QID) | ORAL | Status: DC | PRN
  Administered 2023-04-12 – 2023-04-15 (×2): 650 mg via ORAL
  Filled 2023-04-10 (×2): qty 2

## 2023-04-10 MED ORDER — HYDROMORPHONE HCL 1 MG/ML IJ SOLN
0.5000 mg | INTRAMUSCULAR | Status: DC | PRN
  Administered 2023-04-14: 0.5 mg via INTRAVENOUS
  Administered 2023-04-15 – 2023-04-16 (×5): 1 mg via INTRAVENOUS
  Filled 2023-04-10 (×6): qty 1

## 2023-04-10 MED ORDER — MORPHINE SULFATE (PF) 2 MG/ML IV SOLN
INTRAVENOUS | Status: AC
  Filled 2023-04-10: qty 2

## 2023-04-10 MED ORDER — DIPHENHYDRAMINE HCL 12.5 MG/5ML PO ELIX: 12.5000 mg | ORAL_SOLUTION | ORAL | Status: DC | PRN

## 2023-04-10 MED ORDER — ASPIRIN 81 MG PO CHEW
81.0000 mg | CHEWABLE_TABLET | Freq: Two times a day (BID) | ORAL | Status: DC
  Administered 2023-04-10 – 2023-04-13 (×7): 81 mg via ORAL
  Filled 2023-04-10 (×7): qty 1

## 2023-04-10 MED ORDER — MORPHINE SULFATE (PF) 2 MG/ML IV SOLN
4.0000 mg | Freq: Once | INTRAVENOUS | Status: AC
  Administered 2023-04-10: 4 mg via INTRAVENOUS

## 2023-04-10 MED ORDER — OXYCODONE HCL 5 MG/5ML PO SOLN: 5.0000 mg | Freq: Once | ORAL | Status: DC | PRN

## 2023-04-10 MED ORDER — MENTHOL 3 MG MT LOZG: 1.0000 | LOZENGE | OROMUCOSAL | Status: DC | PRN

## 2023-04-10 MED ORDER — LIDOCAINE 2% (20 MG/ML) 5 ML SYRINGE
INTRAMUSCULAR | Status: DC | PRN
  Administered 2023-04-10: 100 mg via INTRAVENOUS

## 2023-04-10 MED ORDER — FUROSEMIDE 20 MG PO TABS
20.0000 mg | ORAL_TABLET | Freq: Every day | ORAL | Status: DC
  Administered 2023-04-11 – 2023-04-12 (×2): 20 mg via ORAL
  Filled 2023-04-10 (×2): qty 1

## 2023-04-10 MED ORDER — COLCHICINE 0.6 MG PO TABS
0.6000 mg | ORAL_TABLET | Freq: Every day | ORAL | Status: DC
  Administered 2023-04-11 – 2023-04-18 (×7): 0.6 mg via ORAL
  Filled 2023-04-10 (×7): qty 1

## 2023-04-10 MED ORDER — METHOCARBAMOL 500 MG PO TABS
500.0000 mg | ORAL_TABLET | Freq: Four times a day (QID) | ORAL | Status: DC | PRN
  Administered 2023-04-13 – 2023-04-17 (×6): 500 mg via ORAL
  Filled 2023-04-10 (×7): qty 1

## 2023-04-10 MED ORDER — ONDANSETRON HCL 4 MG PO TABS: 4.0000 mg | ORAL_TABLET | Freq: Four times a day (QID) | ORAL | Status: DC | PRN

## 2023-04-10 MED ORDER — PHENOL 1.4 % MT LIQD: 1.0000 | OROMUCOSAL | Status: DC | PRN

## 2023-04-10 MED ORDER — CHLORHEXIDINE GLUCONATE 0.12 % MT SOLN: 15.0000 mL | Freq: Once | OROMUCOSAL | Status: AC

## 2023-04-10 MED ORDER — DOCUSATE SODIUM 100 MG PO CAPS
100.0000 mg | ORAL_CAPSULE | Freq: Two times a day (BID) | ORAL | Status: DC
  Administered 2023-04-10 – 2023-04-18 (×15): 100 mg via ORAL
  Filled 2023-04-10 (×15): qty 1

## 2023-04-10 MED ORDER — MORPHINE SULFATE (PF) 4 MG/ML IV SOLN: 4.0000 mg | Freq: Once | INTRAVENOUS | Status: DC

## 2023-04-10 MED ORDER — AMLODIPINE BESYLATE 5 MG PO TABS
5.0000 mg | ORAL_TABLET | Freq: Every day | ORAL | Status: DC
  Administered 2023-04-12: 5 mg via ORAL
  Filled 2023-04-10 (×2): qty 1

## 2023-04-10 MED ORDER — PROPOFOL 10 MG/ML IV BOLUS
INTRAVENOUS | Status: AC
  Filled 2023-04-10: qty 20

## 2023-04-10 MED ORDER — PROPOFOL 10 MG/ML IV BOLUS
INTRAVENOUS | Status: DC | PRN
  Administered 2023-04-10: 90 mg via INTRAVENOUS

## 2023-04-10 MED ORDER — LIDOCAINE 2% (20 MG/ML) 5 ML SYRINGE
INTRAMUSCULAR | Status: AC
  Filled 2023-04-10: qty 5

## 2023-04-10 MED ORDER — SENNA 8.6 MG PO TABS
1.0000 | ORAL_TABLET | Freq: Two times a day (BID) | ORAL | Status: DC
  Administered 2023-04-10 – 2023-04-18 (×14): 8.6 mg via ORAL
  Filled 2023-04-10 (×15): qty 1

## 2023-04-10 MED ORDER — ORAL CARE MOUTH RINSE: 15.0000 mL | Freq: Once | OROMUCOSAL | Status: AC

## 2023-04-10 MED ORDER — ACETAMINOPHEN 500 MG PO TABS
1000.0000 mg | ORAL_TABLET | Freq: Four times a day (QID) | ORAL | Status: AC
  Administered 2023-04-10 – 2023-04-11 (×4): 1000 mg via ORAL
  Filled 2023-04-10 (×4): qty 2

## 2023-04-10 MED ORDER — SODIUM CHLORIDE 0.9 % IV BOLUS
1000.0000 mL | Freq: Once | INTRAVENOUS | Status: AC
  Administered 2023-04-10: 1000 mL via INTRAVENOUS

## 2023-04-10 MED ORDER — ONDANSETRON HCL 4 MG/2ML IJ SOLN
INTRAMUSCULAR | Status: DC | PRN
  Administered 2023-04-10: 4 mg via INTRAVENOUS

## 2023-04-10 MED ORDER — FENTANYL CITRATE PF 50 MCG/ML IJ SOSY
50.0000 ug | PREFILLED_SYRINGE | Freq: Once | INTRAMUSCULAR | Status: AC
  Administered 2023-04-10: 50 ug via INTRAVENOUS

## 2023-04-10 MED ORDER — LACTATED RINGERS IV SOLN: INTRAVENOUS | Status: AC

## 2023-04-10 MED ORDER — FENTANYL CITRATE (PF) 100 MCG/2ML IJ SOLN
INTRAMUSCULAR | Status: AC
  Administered 2023-04-10: 25 ug via INTRAVENOUS
  Filled 2023-04-10: qty 2

## 2023-04-10 MED ORDER — POLYETHYLENE GLYCOL 3350 17 G PO PACK
17.0000 g | PACK | Freq: Every day | ORAL | Status: DC | PRN
Start: 2023-04-10 — End: 2023-04-14

## 2023-04-10 MED ORDER — ROCURONIUM BROMIDE 10 MG/ML (PF) SYRINGE
PREFILLED_SYRINGE | INTRAVENOUS | Status: AC
  Filled 2023-04-10: qty 10

## 2023-04-10 MED ORDER — ONDANSETRON HCL 4 MG/2ML IJ SOLN
4.0000 mg | Freq: Four times a day (QID) | INTRAMUSCULAR | Status: DC | PRN
Start: 2023-04-10 — End: 2023-04-18

## 2023-04-10 MED ORDER — FENTANYL CITRATE (PF) 250 MCG/5ML IJ SOLN
INTRAMUSCULAR | Status: DC | PRN
  Administered 2023-04-10 (×2): 50 ug via INTRAVENOUS

## 2023-04-10 MED ORDER — CHLORHEXIDINE GLUCONATE 0.12 % MT SOLN
OROMUCOSAL | Status: AC
  Administered 2023-04-10: 15 mL via OROMUCOSAL
  Filled 2023-04-10: qty 15

## 2023-04-10 MED ORDER — OXYCODONE HCL 5 MG PO TABS
5.0000 mg | ORAL_TABLET | Freq: Once | ORAL | Status: DC | PRN
Start: 2023-04-10 — End: 2023-04-10

## 2023-04-10 MED ORDER — OXYCODONE HCL 5 MG PO TABS: 5.0000 mg | ORAL_TABLET | Freq: Once | ORAL | Status: DC | PRN

## 2023-04-10 MED ORDER — FENTANYL CITRATE PF 50 MCG/ML IJ SOSY
PREFILLED_SYRINGE | INTRAMUSCULAR | Status: AC
  Filled 2023-04-10: qty 1

## 2023-04-10 SURGICAL SUPPLY — 1 items: PILLOW ABDUCTION MEDIUM (MISCELLANEOUS) IMPLANT

## 2023-04-10 NOTE — ED Notes (Signed)
 Ortho MD at bedside.

## 2023-04-10 NOTE — Sedation Documentation (Signed)
 Pt transitioned to NR, head tilt chin lift to improve oxygenation. Oxygen saturations briefly decreased, but improving now

## 2023-04-10 NOTE — H&P (Signed)
 ORTHOPAEDIC H&P   Chief Complaint: Left hip dislocation  HPI: Cynthia Torres is a 78 y.o. female who had undergone left hip total arthroplasty with Dr. Rolan Chancy about 20 years ago.  She had just had spine surgery about 2 months ago.  She reports a week ago she had a fall at home significant pain in the left hip difficulty ambulate.  Given continued pain and discomfort she did go to see [facility] yesterday where for x-rays demonstrated a dislocated hip replacement.  She then presented today to our office and given the presence of dislocation recommended she proceed to the emergency room for closed reduction attempt.  This was done in the ER and was unsuccessful so discussed plan for proceeding to the OR.  Past Medical History:  Diagnosis Date   Arthritis    Diabetes mellitus without complication (HCC)    Gout    HTN (hypertension)    Hyperlipidemia    Past Surgical History:  Procedure Laterality Date   ABDOMINAL HYSTERECTOMY     CESAREAN SECTION     JOINT REPLACEMENT Bilateral    hips   Social History   Socioeconomic History   Marital status: Widowed    Spouse name: Not on file   Number of children: Not on file   Years of education: Not on file   Highest education level: Not on file  Occupational History   Not on file  Tobacco Use   Smoking status: Never   Smokeless tobacco: Never  Vaping Use   Vaping status: Never Used  Substance and Sexual Activity   Alcohol  use: No    Alcohol /week: 0.0 standard drinks of alcohol    Drug use: No   Sexual activity: Not on file  Other Topics Concern   Not on file  Social History Narrative   Not on file   Social Drivers of Health   Financial Resource Strain: Not on file  Food Insecurity: Not on file  Transportation Needs: Not on file  Physical Activity: Not on file  Stress: Not on file  Social Connections: Unknown (07/17/2021)   [facility]   Social Network    Social Network: Not on file    Family History  Problem Relation Age of Onset   Breast cancer Neg Hx    Allergies  Allergen Reactions   Atorvastatin     Other reaction(s): myalgias     Positive ROS: All other systems have been reviewed and were otherwise negative with the exception of those mentioned in the HPI and as above.  Physical Exam: General: Alert, no acute distress Cardiovascular: No pedal edema Respiratory: No cyanosis, no use of accessory musculature Skin: No lesions in the area of chief complaint Neurologic: Sensation intact distally Psychiatric: Patient is competent for consent with normal mood and affect  MUSCULOSKELETAL:  LLE well-healed left lateral hip incision  Left leg is shortened with swelling around the hip   groin pain with log roll  No knee or ankle effusion  Knee stable to varus/ valgus stress  Sens DPN, SPN, TN intact  Motor EHL, ext, flex 5/5  DP 2+, PT 2+, No significant edema   IMAGING: X-rays reviewed demonstrated posterior dislocated left total hip prosthesis no acute fracture or osseous abnormalities  Assessment:  Dislocated left total hip prosthesis 1 week ago unsuccessful close reduction attempt  Plan: Given failure of close reduction in the ER will attempt closed reduction now in the operating room.  Discussed if unsuccessful  patient will need an open reduction with possible revision of components.  Will take some time to obtain the revision components so would plan to do this sometime next week if we fail to do the close reduction.  Ultimately if reduction is successful we will plan for admission overnight immobilization with therapy in the morning.    TORIBIO DELENA HIGASHI, MD  Contact information:   Tzzxijbd 7am-5pm epic message Dr. Higashi, or call office for patient follow up: [phone] After hours and holidays please check [email or web address] for group call information for [facility]

## 2023-04-10 NOTE — Progress Notes (Signed)
 Orthopedic Tech Progress Note Patient Details:  Cynthia Torres 05-17-1945 [id number]  Order for a L hip abduction brace with a standard flexion/abduction of 60/15 called into [facility].  Patient ID: Cynthia Torres, female   DOB: [birth year], 78 y.o.   MRN: [id number]  Tinnie Ronal Brasil 04/10/2023, 7:13 PM

## 2023-04-10 NOTE — Progress Notes (Signed)
 Orthopedic Tech Progress Note Patient Details:  [name] Feb 27, 1946 [id number]  Ortho techs assisted Ozell Ned, PA-C with a conscious sedation/attempted closed reduction of the L hip.   Patient ID: Cynthia Torres, female   DOB: 05/17/1945, 78 y.o.   MRN: [id number]  Tinnie Ronal Brasil 04/10/2023, 5:10 PM

## 2023-04-10 NOTE — Consult Note (Signed)
 Reason for Consult:Left hip dislocation Referring Physician: Richerd Later Time called: 1259 Time at bedside: 1447   Cynthia Torres is an 78 y.o. female.  HPI: Cynthia Torres fell last Wednesday and hurt her left hip. She was able to continue to ambulate albeit with pain. She went to [facility] who dx a dislocation. She was sent to [facility] who sent her to the ED for a CR attempt. She has not had significant problems with the hip since the THA.  Past Medical History:  Diagnosis Date   Arthritis    Diabetes mellitus without complication (HCC)    Gout    HTN (hypertension)    Hyperlipidemia     Past Surgical History:  Procedure Laterality Date   ABDOMINAL HYSTERECTOMY     CESAREAN SECTION     JOINT REPLACEMENT Bilateral    hips    Family History  Problem Relation Age of Onset   Breast cancer Neg Hx     Social History:  reports that she has never smoked. She has never used smokeless tobacco. She reports that she does not drink alcohol  and does not use drugs.  Allergies:  Allergies  Allergen Reactions   Atorvastatin     Other reaction(s): myalgias    Medications: I have reviewed the patient's current medications.  Results for orders placed or performed during the hospital encounter of 04/10/23 (from the past 48 hours)  CBC with Differential     Status: Abnormal   Collection Time: 04/10/23 12:36 PM  Result Value Ref Range   WBC 9.7 4.0 - 10.5 K/uL   RBC 3.68 (L) 3.87 - 5.11 MIL/uL   Hemoglobin 10.7 (L) 12.0 - 15.0 g/dL   HCT 67.2 (L) 63.9 - 53.9 %   MCV 88.9 80.0 - 100.0 fL   MCH 29.1 26.0 - 34.0 pg   MCHC 32.7 30.0 - 36.0 g/dL   RDW 86.9 88.4 - 84.4 %   Platelets 246 150 - 400 K/uL   nRBC 0.0 0.0 - 0.2 %   Neutrophils Relative % 78 %   Neutro Abs 7.7 1.7 - 7.7 K/uL   Lymphocytes Relative 13 %   Lymphs Abs 1.3 0.7 - 4.0 K/uL   Monocytes Relative 6 %   Monocytes Absolute 0.6 0.1 - 1.0 K/uL   Eosinophils Relative 1 %   Eosinophils Absolute 0.1 0.0 - 0.5 K/uL   Basophils  Relative 1 %   Basophils Absolute 0.1 0.0 - 0.1 K/uL   Immature Granulocytes 1 %   Abs Immature Granulocytes 0.05 0.00 - 0.07 K/uL    Comment: Performed at [facility], [address], [place], [place] [address]  Basic metabolic panel     Status: Abnormal   Collection Time: 04/10/23 12:36 PM  Result Value Ref Range   Sodium 139 135 - 145 mmol/L   Potassium 3.7 3.5 - 5.1 mmol/L   Chloride 104 98 - 111 mmol/L   CO2 19 (L) 22 - 32 mmol/L   Glucose, Bld 134 (H) 70 - 99 mg/dL    Comment: Glucose reference range applies only to samples taken after fasting for at least 8 hours.   BUN 26 (H) 8 - 23 mg/dL   Creatinine, Ser 8.79 (H) 0.44 - 1.00 mg/dL   Calcium  11.7 (H) 8.9 - 10.3 mg/dL   GFR, Estimated 47 (L) >60 mL/min    Comment: (NOTE) Calculated using the CKD-EPI Creatinine Equation (2021)    Anion gap 16 (H) 5 - 15  Comment: Performed at [facility], [address], [place], [place] [address]    DG Hip Burnis ORN or [place] Pelvis 2-3 Views Left Result Date: 04/10/2023 CLINICAL DATA:  Fall and left hip pain. EXAM: DG HIP (WITH OR WITHOUT PELVIS) 2-3V LEFT COMPARISON:  None Available. FINDINGS: Total bilateral hip arthroplasty. There is superior subluxation of the femoral component of the left hip arthroplasty in relation to the acetabular cup. No definite acute fracture. The bones are osteopenic which limits evaluation for fracture. Degenerative changes of the lower lumbar spine and fusion hardware. The soft tissues are unremarkable. IMPRESSION: Superior subluxation of the femoral component of the left hip arthroplasty. Electronically Signed   By: Vanetta Chou M.D.   On: 04/10/2023 14:33   DG Chest Port 1 View Result Date: 04/10/2023 CLINICAL DATA:  Left hip dislocation after fall last week. EXAM: PORTABLE CHEST 1 VIEW COMPARISON:  None Available. FINDINGS: The heart size and mediastinal contours are within normal limits. Both lungs are clear. The visualized skeletal structures are  unremarkable. IMPRESSION: No active disease. Electronically Signed   By: Lynwood Landy Raddle M.D.   On: 04/10/2023 14:29    Review of Systems  HENT:  Negative for ear discharge, ear pain, hearing loss and tinnitus.   Eyes:  Negative for photophobia and pain.  Respiratory:  Negative for cough and shortness of breath.   Cardiovascular:  Negative for chest pain.  Gastrointestinal:  Negative for abdominal pain, nausea and vomiting.  Genitourinary:  Negative for dysuria, flank pain, frequency and urgency.  Musculoskeletal:  Positive for arthralgias (Bilateral hips, L>R). Negative for back pain, myalgias and neck pain.  Neurological:  Negative for dizziness and headaches.  Hematological:  Does not bruise/bleed easily.  Psychiatric/Behavioral:  The patient is not nervous/anxious.    Blood pressure (!) 146/59, pulse (!) 58, temperature 98 F (36.7 C), resp. rate (!) 27, height 4' 11 (1.499 m), weight 74.8 kg, SpO2 100%. Physical Exam Constitutional:      General: She is not in acute distress.    Appearance: She is well-developed. She is not diaphoretic.  HENT:     Head: Normocephalic and atraumatic.  Eyes:     General: No scleral icterus.       Right eye: No discharge.        Left eye: No discharge.     Conjunctiva/sclera: Conjunctivae normal.  Cardiovascular:     Rate and Rhythm: Normal rate and regular rhythm.  Pulmonary:     Effort: Pulmonary effort is normal. No respiratory distress.  Musculoskeletal:     Cervical back: Normal range of motion.     Comments: LLE No traumatic wounds, ecchymosis, or rash  Mild TTP  No knee or ankle effusion  Knee stable to varus/ valgus and anterior/posterior stress  Sens DPN, SPN, TN intact  Motor EHL, ext, flex, evers 5/5  DP 2+, PT 2+, No significant edema  Skin:    General: Skin is warm and dry.  Neurological:     Mental Status: She is alert.  Psychiatric:        Mood and Affect: Mood normal.        Behavior: Behavior normal.      Assessment/Plan: Left hip dislocation -- Will attempt CR under CS via EDP.    Cynthia DOROTHA Ned, PA-C Orthopedic Surgery [phone] 04/10/2023, 2:57 PM

## 2023-04-10 NOTE — Procedures (Signed)
 Procedure: Left hip closed reduction   Indication: Left hip dislocation   Surgeon: Ozell Ned, PA-C   Assist: None   Anesthesia: Propofol  via EDP   EBL: None   Complications: Unsuccessful   Findings: After risks/benefits explained patient desires to undergo procedure. Consent obtained and time out performed. Sedation given and efficacy confirmed. Attempted reduction but was unsuccessful. Pt tolerated the procedure well.       Ozell DOROTHA Ned, PA-C Orthopedic Surgery [phone]

## 2023-04-10 NOTE — Op Note (Signed)
 04/10/2023  6:04 PM  PATIENT:  Cynthia Torres    PRE-OPERATIVE DIAGNOSIS:  Left Hip Dislocation  POST-OPERATIVE DIAGNOSIS:  Same  PROCEDURE:  CLOSED REDUCTION HIP  SURGEON:  Louie Meaders A Alexandar Weisenberger, MD  PHYSICIAN ASSISTANT: none   ANESTHESIA:   General  PREOPERATIVE INDICATIONS:  Cynthia Torres is a  78 y.o. female with a diagnosis of Left Hip Dislocation who failed conservative measures and elected for surgical management.    The risks benefits and alternatives were discussed with the patient preoperatively including but not limited to the risks of infection, bleeding, nerve injury, cardiopulmonary complications, the need for revision surgery, among others, and the patient was willing to proceed.  ESTIMATED BLOOD LOSS: 0cc  OPERATIVE IMPLANTS: none  OPERATIVE FINDINGS: left posterior hip dislocation  OPERATIVE PROCEDURE:  The patient was brought to the OR and transferred to the operative table.  General anesthesia and relaxation were induced.  Timeout was performed.  Antibiotics were not indicated.  Once the patient was adequately relaxed the hip was flexed, adducted, and internally rotated.  Traction was applied and a clunk was appreciated.  The leg was brought back into extension and fluoroscopy confirmed reduction of the hip replacement.  AP and lateral views were obtained which confirmed appropriate reduction.  The hip was gently taken through range of motion and no instability was appreciated.  A hip abduction pillow was inserted.  The patient was awoken from anesthesia in stable condition and taken to the recovery room.  Post op recs: WB: Weightbearing as tolerated, posterior hip precautions, hip abduction pillow in bed, will work on getting the patient a hip abduction orthosis Abx: Not indicated Imaging: PACU xrays Dressing: None DVT prophylaxis: Not indicated Follow up: 2 weeks in the office Address: [address], [place], [place] [address]  Office Phone: [phone]  Toribio Higashi, MD Orthopaedic Surgery

## 2023-04-10 NOTE — Transfer of Care (Signed)
 Immediate Anesthesia Transfer of Care Note  Patient: Cynthia Torres  Procedure(s) Performed: CLOSED REDUCTION HIP (Left: Hip)  Patient Location: PACU  Anesthesia Type:General  Level of Consciousness: awake, alert , and oriented  Airway & Oxygen Therapy: Patient Spontanous Breathing  Post-op Assessment: Report given to RN and Post -op Vital signs reviewed and stable  Post vital signs: Reviewed and stable  Last Vitals:  Vitals Value Taken Time  BP 135/70 04/10/23 1815  Temp    Pulse 82 04/10/23 1821  Resp 19 04/10/23 1821  SpO2 90 % 04/10/23 1821  Vitals shown include unfiled device data.  Last Pain:  Vitals:   04/10/23 1724  TempSrc: Oral  PainSc: 7          Complications: No notable events documented.

## 2023-04-10 NOTE — ED Triage Notes (Signed)
 Pt arrived via POV from [place] [facility] [place] for hip dislocation. Pt fell last week with pain/injury/reduced mobility to left hip (prior replacement) since then. Pt went to UC and had xray yesterday showing dislocation and sent to [facility] today. After reviewing imaging, pt sent to ED.

## 2023-04-10 NOTE — ED Provider Notes (Signed)
  EMERGENCY DEPARTMENT AT [facility] Provider Note   CSN: [id number] Arrival date & time: 04/10/23  1205     History  Chief Complaint  Patient presents with   Hip Pain    Cynthia Torres is a 78 y.o. female.  Patient was sent to the emergency department for evaluation of pain to her left hip.  Patient reports that she fell a week ago and injured her hip.  Patient was seen at an urgent care and told that her hip was out of place.  Patient was advised by the orthopedist office to come to the emergency department for evaluation.  Patient complains of difficulty walking due to pain in her left hip.  Patient reports the pain is severe with any movement.  Patient reports that she had a hip replacement over 20 years ago.  Patient denies any other area of injury she did not hit her head she did not lose consciousness  The history is provided by the patient and a relative.  Hip Pain       Home Medications Prior to Admission medications   Medication Sig Start Date End Date Taking? Authorizing Provider  allopurinol  (ZYLOPRIM ) 300 MG tablet  03/05/18   [provider]  amLODipine  (NORVASC ) 5 MG tablet Take 5 mg by mouth daily.    [provider]  atenolol  (TENORMIN ) 100 MG tablet Take 100 mg by mouth daily.    [provider]  colchicine  0.6 MG tablet Take 0.6 mg by mouth daily. 02/12/23   [provider]  furosemide  (LASIX ) 20 MG tablet Take 20 mg by mouth daily. 11/15/16   [provider]  lisinopril -hydrochlorothiazide  (PRINZIDE ,ZESTORETIC ) 20-25 MG tablet Take 1 tablet by mouth daily. 03/17/18   [provider]  meclizine  (ANTIVERT ) 25 MG tablet Take 1 tablet (25 mg total) by mouth 3 (three) times daily as needed for dizziness. 10/21/22   Laurice Maude BROCKS, MD  metFORMIN  (GLUCOPHAGE ) 500 MG tablet Take 500-1,000 mg by mouth in the morning, at noon, and at bedtime. 1,000mg  in AM and 500mg  in PM    [provider]   moxifloxacin (VIGAMOX) 0.5 % ophthalmic solution Place 1 drop into the right eye 4 (four) times daily. 03/16/19   [provider]  ondansetron  (ZOFRAN ) 4 MG tablet Take 1 tablet (4 mg total) by mouth every 6 (six) hours. 10/21/22   Laurice Maude BROCKS, MD  ONETOUCH VERIO test strip  05/08/18   [provider]  potassium chloride  (MICRO-K ) 10 MEQ CR capsule Take 10 mEq by mouth 2 (two) times daily. 01/20/20   [provider]  rosuvastatin  (CRESTOR ) 10 MG tablet Take 5 mg by mouth once. 05/04/20   [provider]      Allergies    Atorvastatin    Review of Systems   Review of Systems  All other systems reviewed and are negative.   Physical Exam Updated Vital Signs BP (!) 146/59   Pulse (!) 58   Temp 98 F (36.7 C)   Resp (!) 27   Ht 4' 11 (1.499 m)   Wt 74.8 kg   SpO2 100%   BMI 33.33 kg/m  Physical Exam Vitals and nursing note reviewed.  Constitutional:      Appearance: She is well-developed.  HENT:     Head: Normocephalic.  Cardiovascular:     Rate and Rhythm: Normal rate.  Pulmonary:     Effort: Pulmonary effort is normal.  Abdominal:  General: There is no distension.  Musculoskeletal:        General: Tenderness present.     Cervical back: Normal range of motion.     Comments: Tender left hip decreased range of motion neurovascular neurosensory intact  Skin:    General: Skin is warm.  Neurological:     General: No focal deficit present.     Mental Status: She is alert and oriented to person, place, and time.     ED Results / Procedures / Treatments   Labs (all labs ordered are listed, but only abnormal results are displayed) Labs Reviewed  CBC WITH DIFFERENTIAL/PLATELET - Abnormal; Notable for the following components:      Result Value   RBC 3.68 (*)    Hemoglobin 10.7 (*)    HCT 32.7 (*)    All other components within normal limits  BASIC METABOLIC PANEL - Abnormal; Notable for the following components:   CO2 19 (*)     Glucose, Bld 134 (*)    BUN 26 (*)    Creatinine, Ser 1.20 (*)    Calcium  11.7 (*)    GFR, Estimated 47 (*)    Anion gap 16 (*)    All other components within normal limits    EKG None  Radiology DG Hip Unilat W or Wo Pelvis 2-3 Views Left Result Date: 04/10/2023 CLINICAL DATA:  Fall and left hip pain. EXAM: DG HIP (WITH OR WITHOUT PELVIS) 2-3V LEFT COMPARISON:  None Available. FINDINGS: Total bilateral hip arthroplasty. There is superior subluxation of the femoral component of the left hip arthroplasty in relation to the acetabular cup. No definite acute fracture. The bones are osteopenic which limits evaluation for fracture. Degenerative changes of the lower lumbar spine and fusion hardware. The soft tissues are unremarkable. IMPRESSION: Superior subluxation of the femoral component of the left hip arthroplasty. Electronically Signed   By: Vanetta Chou M.D.   On: 04/10/2023 14:33   DG Chest Port 1 View Result Date: 04/10/2023 CLINICAL DATA:  Left hip dislocation after fall last week. EXAM: PORTABLE CHEST 1 VIEW COMPARISON:  None Available. FINDINGS: The heart size and mediastinal contours are within normal limits. Both lungs are clear. The visualized skeletal structures are unremarkable. IMPRESSION: No active disease. Electronically Signed   By: Lynwood Landy Raddle M.D.   On: 04/10/2023 14:29    Procedures Procedures    Medications Ordered in ED Medications  propofol  (DIPRIVAN ) 10 mg/mL bolus/IV push 37.4 mg (has no administration in time range)  fentaNYL  (SUBLIMAZE ) injection 50 mcg (50 mcg Intravenous Given 04/10/23 1241)  morphine  (PF) 2 MG/ML injection 4 mg (4 mg Intravenous Given 04/10/23 1323)  morphine  (PF) 4 MG/ML injection 4 mg (4 mg Intravenous Given 04/10/23 1454)    ED Course/ Medical Decision Making/ A&P Clinical Course as of 04/10/23 1500  Thu Apr 10, 2023  1332 DG Hip Unilat W or Wo Pelvis 2-3 Views Left [IC]    Clinical Course User Index [IC] Charlott Franklin,  Student-PA                                 Medical Decision Making Patient reports she fell a week ago onto her left hip.  She has had pain and inability to walk since that time.  Amount and/or Complexity of Data Reviewed Labs: ordered. Decision-making details documented in ED Course.    Details: Labs ordered reviewed and interpreted. Radiology: ordered and  independent interpretation performed. Decision-making details documented in ED Course.    Details: X-ray left hip shows subluxation.  Left prosthetic hip Discussion of management or test interpretation with external provider(s): I discussed the patient with Ozell Kenner, PA-C he is here to see and evaluate the patient for orthopedics.  Risk Prescription drug management. Risk Details: Reduction attempted by Ozell Kenner without success.  Dr. Montine will see here            Final Clinical Impression(s) / ED Diagnoses Final diagnoses:  Subluxation of hip, left, initial encounter [facility])    Rx / DC Orders ED Discharge Orders     None         Flint Sonny MARLA DEVONNA 04/10/23 1544    Mannie Pac T, DO 04/15/23 2041

## 2023-04-10 NOTE — Anesthesia Procedure Notes (Signed)
 Procedure Name: Intubation Date/Time: 04/10/2023 5:58 PM  Performed by: Zurisadai Helminiak J, CRNAPre-anesthesia Checklist: Patient identified, Emergency Drugs available, Suction available and Patient being monitored Patient Re-evaluated:Patient Re-evaluated prior to induction Oxygen Delivery Method: Circle System Utilized Preoxygenation: Pre-oxygenation with 100% oxygen Induction Type: IV induction Ventilation: Mask ventilation without difficulty Laryngoscope Size: Miller and 3 Grade View: Grade II Tube type: Oral Tube size: 7.0 mm Number of attempts: 1 Airway Equipment and Method: Stylet and Oral airway Placement Confirmation: ETT inserted through vocal cords under direct vision, positive ETCO2 and breath sounds checked- equal and bilateral Secured at: 21 cm Tube secured with: Tape Dental Injury: Teeth and Oropharynx as per pre-operative assessment

## 2023-04-10 NOTE — Sedation Documentation (Addendum)
 Pt awakening, transitioned back to a nasal cannula, SPO2 100%

## 2023-04-10 NOTE — Interval H&P Note (Signed)
 The patient has been re-examined, and the chart reviewed, and there have been no interval changes to the documented history and physical.    Patient with 78 year old hip replacement done by Dr. Rolan Chancy with a dislocation likely sustained last week during a fall.  Unsuccessful closed reduction in the emergency room.  Will attempt closed reduction now in the operating room with general anesthesia and relaxation.  Discussed possibility of inability to reduce the hip since has been out for a week.  If this happens we will require an open reduction which we will likely plan for next week.  The operative side was examined and the patient was confirmed to have sensation to DPN, SPN, TN intact, Motor EHL, ext, flex 5/5, and DP 2+, PT 2+, No significant edema.   The risks, benefits, and alternatives have been discussed at length with patient, and the patient is willing to proceed.  Left hip marked. Consent has been signed.

## 2023-04-10 NOTE — Anesthesia Preprocedure Evaluation (Addendum)
 Anesthesia Evaluation  Patient identified by MRN, date of birth, ID band Patient awake    Reviewed: Allergy & Precautions, NPO status , Patient's Chart, lab work & pertinent test results, reviewed documented beta blocker date and time   History of Anesthesia Complications Negative for: history of anesthetic complications  Airway Mallampati: I  TM Distance: >3 FB     Dental  (+) Missing, Poor Dentition   Pulmonary neg pulmonary ROS, neg COPD, neg PE   breath sounds clear to auscultation       Cardiovascular hypertension, Pt. on medications and Pt. on home beta blockers (-) angina (-) CAD, (-) Past MI and (-) Cardiac Stents  Rhythm:Regular Rate:Normal     Neuro/Psych neg Seizures  Neuromuscular disease  negative psych ROS   GI/Hepatic Neg liver ROS,GERD  ,,(+) neg Cirrhosis        Endo/Other  diabetes, Type 2, Oral Hypoglycemic Agents   Obesity   Renal/GU Renal InsufficiencyRenal disease     Musculoskeletal  (+) Arthritis ,   Gout Hip dislocation    Abdominal   Peds  Hematology  (+) Blood dyscrasia, anemia   Anesthesia Other Findings   Reproductive/Obstetrics                             Anesthesia Physical Anesthesia Plan  ASA: 3  Anesthesia Plan: General   Post-op Pain Management: Minimal or no pain anticipated   Induction: Intravenous  PONV Risk Score and Plan: 3 and Treatment may vary due to age or medical condition, Ondansetron  and Dexamethasone   Airway Management Planned: Oral ETT  Additional Equipment: None  Intra-op Plan:   Post-operative Plan: Extubation in OR  Informed Consent: I have reviewed the patients History and Physical, chart, labs and discussed the procedure including the risks, benefits and alternatives for the proposed anesthesia with the patient or authorized representative who has indicated his/her understanding and acceptance.     Dental  advisory given  Plan Discussed with: CRNA  Anesthesia Plan Comments:        Anesthesia Quick Evaluation

## 2023-04-10 NOTE — H&P (View-Only) (Signed)
 Reason for Consult:Left hip dislocation Referring Physician: Richerd Later Time called: 1259 Time at bedside: 1447   Cynthia Torres is an 78 y.o. female.  HPI: Cynthia Torres fell last Wednesday and hurt her left hip. She was able to continue to ambulate albeit with pain. She went to [facility] who dx a dislocation. She was sent to [facility] who sent her to the ED for a CR attempt. She has not had significant problems with the hip since the THA.  Past Medical History:  Diagnosis Date   Arthritis    Diabetes mellitus without complication (HCC)    Gout    HTN (hypertension)    Hyperlipidemia     Past Surgical History:  Procedure Laterality Date   ABDOMINAL HYSTERECTOMY     CESAREAN SECTION     JOINT REPLACEMENT Bilateral    hips    Family History  Problem Relation Age of Onset   Breast cancer Neg Hx     Social History:  reports that she has never smoked. She has never used smokeless tobacco. She reports that she does not drink alcohol  and does not use drugs.  Allergies:  Allergies  Allergen Reactions   Atorvastatin     Other reaction(s): myalgias    Medications: I have reviewed the patient's current medications.  Results for orders placed or performed during the hospital encounter of 04/10/23 (from the past 48 hours)  CBC with Differential     Status: Abnormal   Collection Time: 04/10/23 12:36 PM  Result Value Ref Range   WBC 9.7 4.0 - 10.5 K/uL   RBC 3.68 (L) 3.87 - 5.11 MIL/uL   Hemoglobin 10.7 (L) 12.0 - 15.0 g/dL   HCT 67.2 (L) 63.9 - 53.9 %   MCV 88.9 80.0 - 100.0 fL   MCH 29.1 26.0 - 34.0 pg   MCHC 32.7 30.0 - 36.0 g/dL   RDW 86.9 88.4 - 84.4 %   Platelets 246 150 - 400 K/uL   nRBC 0.0 0.0 - 0.2 %   Neutrophils Relative % 78 %   Neutro Abs 7.7 1.7 - 7.7 K/uL   Lymphocytes Relative 13 %   Lymphs Abs 1.3 0.7 - 4.0 K/uL   Monocytes Relative 6 %   Monocytes Absolute 0.6 0.1 - 1.0 K/uL   Eosinophils Relative 1 %   Eosinophils Absolute 0.1 0.0 - 0.5 K/uL   Basophils  Relative 1 %   Basophils Absolute 0.1 0.0 - 0.1 K/uL   Immature Granulocytes 1 %   Abs Immature Granulocytes 0.05 0.00 - 0.07 K/uL    Comment: Performed at [facility], [address], [place], [place] [address]  Basic metabolic panel     Status: Abnormal   Collection Time: 04/10/23 12:36 PM  Result Value Ref Range   Sodium 139 135 - 145 mmol/L   Potassium 3.7 3.5 - 5.1 mmol/L   Chloride 104 98 - 111 mmol/L   CO2 19 (L) 22 - 32 mmol/L   Glucose, Bld 134 (H) 70 - 99 mg/dL    Comment: Glucose reference range applies only to samples taken after fasting for at least 8 hours.   BUN 26 (H) 8 - 23 mg/dL   Creatinine, Ser 8.79 (H) 0.44 - 1.00 mg/dL   Calcium  11.7 (H) 8.9 - 10.3 mg/dL   GFR, Estimated 47 (L) >60 mL/min    Comment: (NOTE) Calculated using the CKD-EPI Creatinine Equation (2021)    Anion gap 16 (H) 5 - 15  Comment: Performed at [facility], [address], [place], [place] [address]    DG Hip Burnis ORN or [place] Pelvis 2-3 Views Left Result Date: 04/10/2023 CLINICAL DATA:  Fall and left hip pain. EXAM: DG HIP (WITH OR WITHOUT PELVIS) 2-3V LEFT COMPARISON:  None Available. FINDINGS: Total bilateral hip arthroplasty. There is superior subluxation of the femoral component of the left hip arthroplasty in relation to the acetabular cup. No definite acute fracture. The bones are osteopenic which limits evaluation for fracture. Degenerative changes of the lower lumbar spine and fusion hardware. The soft tissues are unremarkable. IMPRESSION: Superior subluxation of the femoral component of the left hip arthroplasty. Electronically Signed   By: Vanetta Chou M.D.   On: 04/10/2023 14:33   DG Chest Port 1 View Result Date: 04/10/2023 CLINICAL DATA:  Left hip dislocation after fall last week. EXAM: PORTABLE CHEST 1 VIEW COMPARISON:  None Available. FINDINGS: The heart size and mediastinal contours are within normal limits. Both lungs are clear. The visualized skeletal structures are  unremarkable. IMPRESSION: No active disease. Electronically Signed   By: Lynwood Landy Raddle M.D.   On: 04/10/2023 14:29    Review of Systems  HENT:  Negative for ear discharge, ear pain, hearing loss and tinnitus.   Eyes:  Negative for photophobia and pain.  Respiratory:  Negative for cough and shortness of breath.   Cardiovascular:  Negative for chest pain.  Gastrointestinal:  Negative for abdominal pain, nausea and vomiting.  Genitourinary:  Negative for dysuria, flank pain, frequency and urgency.  Musculoskeletal:  Positive for arthralgias (Bilateral hips, L>R). Negative for back pain, myalgias and neck pain.  Neurological:  Negative for dizziness and headaches.  Hematological:  Does not bruise/bleed easily.  Psychiatric/Behavioral:  The patient is not nervous/anxious.    Blood pressure (!) 146/59, pulse (!) 58, temperature 98 F (36.7 C), resp. rate (!) 27, height 4' 11 (1.499 m), weight 74.8 kg, SpO2 100%. Physical Exam Constitutional:      General: She is not in acute distress.    Appearance: She is well-developed. She is not diaphoretic.  HENT:     Head: Normocephalic and atraumatic.  Eyes:     General: No scleral icterus.       Right eye: No discharge.        Left eye: No discharge.     Conjunctiva/sclera: Conjunctivae normal.  Cardiovascular:     Rate and Rhythm: Normal rate and regular rhythm.  Pulmonary:     Effort: Pulmonary effort is normal. No respiratory distress.  Musculoskeletal:     Cervical back: Normal range of motion.     Comments: LLE No traumatic wounds, ecchymosis, or rash  Mild TTP  No knee or ankle effusion  Knee stable to varus/ valgus and anterior/posterior stress  Sens DPN, SPN, TN intact  Motor EHL, ext, flex, evers 5/5  DP 2+, PT 2+, No significant edema  Skin:    General: Skin is warm and dry.  Neurological:     Mental Status: She is alert.  Psychiatric:        Mood and Affect: Mood normal.        Behavior: Behavior normal.      Assessment/Plan: Left hip dislocation -- Will attempt CR under CS via EDP.    Cynthia DOROTHA Ned, PA-C Orthopedic Surgery [phone] 04/10/2023, 2:57 PM

## 2023-04-11 ENCOUNTER — Encounter (HOSPITAL_COMMUNITY): Payer: Self-pay | Admitting: Orthopedic Surgery

## 2023-04-11 ENCOUNTER — Observation Stay (HOSPITAL_COMMUNITY): Payer: Medicare Other

## 2023-04-11 DIAGNOSIS — E785 Hyperlipidemia, unspecified: Secondary | ICD-10-CM | POA: Diagnosis not present

## 2023-04-11 DIAGNOSIS — Z888 Allergy status to other drugs, medicaments and biological substances status: Secondary | ICD-10-CM | POA: Diagnosis not present

## 2023-04-11 DIAGNOSIS — S73002A Unspecified subluxation of left hip, initial encounter: Secondary | ICD-10-CM | POA: Diagnosis present

## 2023-04-11 DIAGNOSIS — M4310 Spondylolisthesis, site unspecified: Secondary | ICD-10-CM | POA: Diagnosis not present

## 2023-04-11 DIAGNOSIS — Z90710 Acquired absence of both cervix and uterus: Secondary | ICD-10-CM | POA: Diagnosis not present

## 2023-04-11 DIAGNOSIS — M159 Polyosteoarthritis, unspecified: Secondary | ICD-10-CM | POA: Diagnosis not present

## 2023-04-11 DIAGNOSIS — M25452 Effusion, left hip: Secondary | ICD-10-CM | POA: Diagnosis not present

## 2023-04-11 DIAGNOSIS — Z79899 Other long term (current) drug therapy: Secondary | ICD-10-CM | POA: Diagnosis not present

## 2023-04-11 DIAGNOSIS — E669 Obesity, unspecified: Secondary | ICD-10-CM | POA: Diagnosis present

## 2023-04-11 DIAGNOSIS — I739 Peripheral vascular disease, unspecified: Secondary | ICD-10-CM | POA: Diagnosis not present

## 2023-04-11 DIAGNOSIS — Y792 Prosthetic and other implants, materials and accessory orthopedic devices associated with adverse incidents: Secondary | ICD-10-CM | POA: Diagnosis present

## 2023-04-11 DIAGNOSIS — N179 Acute kidney failure, unspecified: Secondary | ICD-10-CM | POA: Diagnosis not present

## 2023-04-11 DIAGNOSIS — E119 Type 2 diabetes mellitus without complications: Secondary | ICD-10-CM | POA: Diagnosis not present

## 2023-04-11 DIAGNOSIS — M25352 Other instability, left hip: Secondary | ICD-10-CM | POA: Diagnosis not present

## 2023-04-11 DIAGNOSIS — Z981 Arthrodesis status: Secondary | ICD-10-CM | POA: Diagnosis not present

## 2023-04-11 DIAGNOSIS — R600 Localized edema: Secondary | ICD-10-CM | POA: Diagnosis not present

## 2023-04-11 DIAGNOSIS — M25552 Pain in left hip: Secondary | ICD-10-CM | POA: Diagnosis not present

## 2023-04-11 DIAGNOSIS — M545 Low back pain, unspecified: Secondary | ICD-10-CM | POA: Diagnosis not present

## 2023-04-11 DIAGNOSIS — R112 Nausea with vomiting, unspecified: Secondary | ICD-10-CM | POA: Diagnosis not present

## 2023-04-11 DIAGNOSIS — S79919A Unspecified injury of unspecified hip, initial encounter: Secondary | ICD-10-CM | POA: Diagnosis not present

## 2023-04-11 DIAGNOSIS — Z7984 Long term (current) use of oral hypoglycemic drugs: Secondary | ICD-10-CM | POA: Diagnosis not present

## 2023-04-11 DIAGNOSIS — M62838 Other muscle spasm: Secondary | ICD-10-CM | POA: Diagnosis not present

## 2023-04-11 DIAGNOSIS — K5730 Diverticulosis of large intestine without perforation or abscess without bleeding: Secondary | ICD-10-CM | POA: Diagnosis not present

## 2023-04-11 DIAGNOSIS — Z96642 Presence of left artificial hip joint: Secondary | ICD-10-CM | POA: Diagnosis not present

## 2023-04-11 DIAGNOSIS — E210 Primary hyperparathyroidism: Secondary | ICD-10-CM | POA: Diagnosis not present

## 2023-04-11 DIAGNOSIS — Z7401 Bed confinement status: Secondary | ICD-10-CM | POA: Diagnosis not present

## 2023-04-11 DIAGNOSIS — S73002D Unspecified subluxation of left hip, subsequent encounter: Secondary | ICD-10-CM | POA: Diagnosis not present

## 2023-04-11 DIAGNOSIS — S73005A Unspecified dislocation of left hip, initial encounter: Secondary | ICD-10-CM | POA: Diagnosis not present

## 2023-04-11 DIAGNOSIS — M109 Gout, unspecified: Secondary | ICD-10-CM | POA: Diagnosis not present

## 2023-04-11 DIAGNOSIS — Z96643 Presence of artificial hip joint, bilateral: Secondary | ICD-10-CM | POA: Diagnosis not present

## 2023-04-11 DIAGNOSIS — K219 Gastro-esophageal reflux disease without esophagitis: Secondary | ICD-10-CM | POA: Diagnosis present

## 2023-04-11 DIAGNOSIS — M48061 Spinal stenosis, lumbar region without neurogenic claudication: Secondary | ICD-10-CM | POA: Diagnosis not present

## 2023-04-11 DIAGNOSIS — K5900 Constipation, unspecified: Secondary | ICD-10-CM | POA: Diagnosis not present

## 2023-04-11 DIAGNOSIS — W19XXXA Unspecified fall, initial encounter: Secondary | ICD-10-CM | POA: Diagnosis present

## 2023-04-11 DIAGNOSIS — E7849 Other hyperlipidemia: Secondary | ICD-10-CM | POA: Diagnosis not present

## 2023-04-11 DIAGNOSIS — T84031A Mechanical loosening of internal left hip prosthetic joint, initial encounter: Secondary | ICD-10-CM | POA: Diagnosis not present

## 2023-04-11 DIAGNOSIS — Z743 Need for continuous supervision: Secondary | ICD-10-CM | POA: Diagnosis not present

## 2023-04-11 DIAGNOSIS — W19XXXD Unspecified fall, subsequent encounter: Secondary | ICD-10-CM | POA: Diagnosis not present

## 2023-04-11 DIAGNOSIS — I10 Essential (primary) hypertension: Secondary | ICD-10-CM | POA: Diagnosis not present

## 2023-04-11 DIAGNOSIS — G8929 Other chronic pain: Secondary | ICD-10-CM | POA: Diagnosis not present

## 2023-04-11 DIAGNOSIS — D649 Anemia, unspecified: Secondary | ICD-10-CM | POA: Diagnosis not present

## 2023-04-11 DIAGNOSIS — D62 Acute posthemorrhagic anemia: Secondary | ICD-10-CM | POA: Diagnosis not present

## 2023-04-11 DIAGNOSIS — R2681 Unsteadiness on feet: Secondary | ICD-10-CM | POA: Diagnosis not present

## 2023-04-11 DIAGNOSIS — E1169 Type 2 diabetes mellitus with other specified complication: Secondary | ICD-10-CM | POA: Diagnosis not present

## 2023-04-11 DIAGNOSIS — Z471 Aftercare following joint replacement surgery: Secondary | ICD-10-CM | POA: Diagnosis not present

## 2023-04-11 DIAGNOSIS — T84021A Dislocation of internal left hip prosthesis, initial encounter: Secondary | ICD-10-CM | POA: Diagnosis not present

## 2023-04-11 DIAGNOSIS — I452 Bifascicular block: Secondary | ICD-10-CM | POA: Diagnosis not present

## 2023-04-11 DIAGNOSIS — S73005D Unspecified dislocation of left hip, subsequent encounter: Secondary | ICD-10-CM | POA: Diagnosis not present

## 2023-04-11 DIAGNOSIS — Z6833 Body mass index (BMI) 33.0-33.9, adult: Secondary | ICD-10-CM | POA: Diagnosis not present

## 2023-04-11 DIAGNOSIS — M25551 Pain in right hip: Secondary | ICD-10-CM | POA: Diagnosis not present

## 2023-04-11 LAB — CBC
HCT: 28.4 % — ABNORMAL LOW (ref 36.0–46.0)
Hemoglobin: 9.1 g/dL — ABNORMAL LOW (ref 12.0–15.0)
MCH: 28.8 pg (ref 26.0–34.0)
MCHC: 32 g/dL (ref 30.0–36.0)
MCV: 89.9 fL (ref 80.0–100.0)
Platelets: 182 10*3/uL (ref 150–400)
RBC: 3.16 MIL/uL — ABNORMAL LOW (ref 3.87–5.11)
RDW: 12.9 % (ref 11.5–15.5)
WBC: 5.9 10*3/uL (ref 4.0–10.5)
nRBC: 0 % (ref 0.0–0.2)

## 2023-04-11 LAB — GLUCOSE, CAPILLARY
Glucose-Capillary: 205 mg/dL — ABNORMAL HIGH (ref 70–99)
Glucose-Capillary: 161 mg/dL — ABNORMAL HIGH (ref 70–99)
Glucose-Capillary: 244 mg/dL — ABNORMAL HIGH (ref 70–99)
Glucose-Capillary: 181 mg/dL — ABNORMAL HIGH (ref 70–99)

## 2023-04-11 LAB — BASIC METABOLIC PANEL
Anion gap: 9 (ref 5–15)
BUN: 25 mg/dL — ABNORMAL HIGH (ref 8–23)
CO2: 23 mmol/L (ref 22–32)
Calcium: 10.3 mg/dL (ref 8.9–10.3)
Chloride: 106 mmol/L (ref 98–111)
Creatinine, Ser: 1.1 mg/dL — ABNORMAL HIGH (ref 0.44–1.00)
GFR, Estimated: 52 mL/min — ABNORMAL LOW (ref >60)
Glucose, Bld: 207 mg/dL — ABNORMAL HIGH (ref 70–99)
Potassium: 4.1 mmol/L (ref 3.5–5.1)
Sodium: 138 mmol/L (ref 135–145)

## 2023-04-11 MED ORDER — INSULIN ASPART 100 UNIT/ML IJ SOLN
0.0000 [IU] | Freq: Three times a day (TID) | INTRAMUSCULAR | Status: DC
  Administered 2023-04-11: 5 [IU] via SUBCUTANEOUS
  Administered 2023-04-11: 3 [IU] via SUBCUTANEOUS
  Administered 2023-04-12: 2 [IU] via SUBCUTANEOUS
  Administered 2023-04-12 – 2023-04-14 (×3): 3 [IU] via SUBCUTANEOUS
  Administered 2023-04-15: 5 [IU] via SUBCUTANEOUS
  Administered 2023-04-15: 3 [IU] via SUBCUTANEOUS
  Administered 2023-04-15: 2 [IU] via SUBCUTANEOUS
  Administered 2023-04-16 – 2023-04-18 (×4): 3 [IU] via SUBCUTANEOUS

## 2023-04-11 MED ORDER — INSULIN ASPART 100 UNIT/ML IJ SOLN
0.0000 [IU] | Freq: Every day | INTRAMUSCULAR | Status: DC
  Administered 2023-04-14 – 2023-04-17 (×2): 2 [IU] via SUBCUTANEOUS

## 2023-04-11 MED ORDER — INSULIN ASPART 100 UNIT/ML IJ SOLN
4.0000 [IU] | Freq: Three times a day (TID) | INTRAMUSCULAR | Status: DC
  Administered 2023-04-11 – 2023-04-18 (×15): 4 [IU] via SUBCUTANEOUS

## 2023-04-11 NOTE — Care Management Obs Status (Signed)
 MEDICARE OBSERVATION STATUS NOTIFICATION   Patient Details  Name: Cynthia Torres MRN: [id number] Date of Birth: 1945-10-10   Medicare Observation Status Notification Given:  Yes    Alisa App, RN 04/11/2023, 1:58 PM

## 2023-04-11 NOTE — Progress Notes (Signed)
 Transition of Care [facility]) - CAGE-AID Screening   Patient Details  Name: Cynthia Torres MRN: [id number] Date of Birth: 1946/01/04  [name], RN Trauma Response Nurse Phone Number: [phone] 04/11/2023, 12:49 PM    CAGE-AID Screening:    Have You Ever Felt You Ought to Cut Down on Your Drinking or Drug Use?: No Have People Annoyed You By Critizing Your Drinking Or Drug Use?: No Have You Felt Bad Or Guilty About Your Drinking Or Drug Use?: No Have You Ever Had a Drink or Used Drugs First Thing In The Morning to Steady Your Nerves or to Get Rid of a Hangover?: No CAGE-AID Score: 0  Substance Abuse Education Offered: (S) No (Pt does not drink or use drugs, no services needed)

## 2023-04-11 NOTE — Progress Notes (Signed)
 Trauma Event Note    Rounding on pt to complete CAGE AID. Daughter is at the bedside- requested that pt have a case manger/social work consult - face to face- prior to discharge. She states pt is going to [facility] for OR and is concerned that it will get missed in the transfer.    Last imported Vital Signs BP (!) 110/54 (BP Location: Left Arm)   Pulse 66   Temp 97.8 F (36.6 C) (Oral)   Resp 20   Ht 4' 11 (1.499 m)   Wt 165 lb (74.8 kg)   SpO2 96%   BMI 33.33 kg/m   Trending CBC Recent Labs    04/10/23 1236 04/11/23 0433  WBC 9.7 5.9  HGB 10.7* 9.1*  HCT 32.7* 28.4*  PLT 246 182    Trending Coag's No results for input(s): APTT, INR in the last 72 hours.  Trending BMET Recent Labs    04/10/23 1236 04/11/23 0433  NA 139 138  K 3.7 4.1  CL 104 106  CO2 19* 23  BUN 26* 25*  CREATININE 1.20* 1.10*  GLUCOSE 134* 207*      Hamzeh Tall M Cassey Bacigalupo  Trauma Response RN  Please call TRN at [phone] for further assistance.

## 2023-04-11 NOTE — Progress Notes (Signed)
     Subjective:  Patient reports pain as moderate. Discussed that unfortunately her hip had redislocated in the recovery room after being closed reduced in the OR. Discussed plan for hip revision surgery likely Monday.   Objective:   VITALS:   Vitals:   04/10/23 2007 04/10/23 2351 04/11/23 0405 04/11/23 0500  BP: (!) 158/79 (!) 112/58 (!) 113/51   Pulse: 77 77 (!) 58 61  Resp: 19 17 17    Temp: 97.9 F (36.6 C) 97.6 F (36.4 C) (!) 97.4 F (36.3 C) 97.6 F (36.4 C)  TempSrc: Oral   Oral  SpO2: 98% 96% 96%   Weight:      Height:        Sensation intact distally Intact pulses distally Dorsiflexion/Plantar flexion intact    Lab Results  Component Value Date   WBC 5.9 04/11/2023   HGB 9.1 (L) 04/11/2023   HCT 28.4 (L) 04/11/2023   MCV 89.9 04/11/2023   PLT 182 04/11/2023   BMET    Component Value Date/Time   NA 138 04/11/2023 0433   K 4.1 04/11/2023 0433   CL 106 04/11/2023 0433   CO2 23 04/11/2023 0433   GLUCOSE 207 (H) 04/11/2023 0433   BUN 25 (H) 04/11/2023 0433   CREATININE 1.10 (H) 04/11/2023 0433   CALCIUM  10.3 04/11/2023 0433   GFRNONAA 52 (L) 04/11/2023 0433      Xray: PACU xray show recurrent dislocation of left hip prosthesis  Assessment/Plan: 1 Day Post-Op   Principal Problem:   Closed dislocation of left hip, initial encounter (HCC)  Left hip instability  Unforunately despite closed reduction the hip has redislocated. I think she is unstable at this point and will need an open reduction with component revision. Will get workup with CT scan and obtain implant records. Will plan for revision likely Monday.    Toan Mort A Jorden Mahl 04/11/2023, 7:35 AM   Toribio Higashi, MD  Contact information:   [phone] 7am-5pm epic message Dr. Higashi, or call office for patient follow up: [phone] After hours and holidays please check [email or web address] for group call information for [facility]

## 2023-04-11 NOTE — Anesthesia Postprocedure Evaluation (Signed)
 Anesthesia Post Note  Patient: Cynthia Torres  Procedure(s) Performed: CLOSED REDUCTION HIP (Left: Hip)     Patient location during evaluation: PACU Anesthesia Type: General Level of consciousness: awake and alert Pain management: pain level controlled Vital Signs Assessment: post-procedure vital signs reviewed and stable Respiratory status: spontaneous breathing, nonlabored ventilation, respiratory function stable and patient connected to nasal cannula oxygen Cardiovascular status: blood pressure returned to baseline and stable Postop Assessment: no apparent nausea or vomiting Anesthetic complications: no   No notable events documented.  Last Vitals:  Vitals:   04/11/23 1124 04/11/23 1630  BP: (!) 110/54 (!) 110/57  Pulse: 66 60  Resp: 20 20  Temp: 36.6 C 36.7 C  SpO2: 96% 95%    Last Pain:  Vitals:   04/11/23 1655  TempSrc:   PainSc: 3                  Lynwood MARLA Cornea

## 2023-04-12 ENCOUNTER — Inpatient Hospital Stay (HOSPITAL_COMMUNITY): Payer: Medicare Other

## 2023-04-12 DIAGNOSIS — N179 Acute kidney failure, unspecified: Secondary | ICD-10-CM | POA: Diagnosis not present

## 2023-04-12 DIAGNOSIS — I10 Essential (primary) hypertension: Secondary | ICD-10-CM | POA: Diagnosis not present

## 2023-04-12 DIAGNOSIS — S73005A Unspecified dislocation of left hip, initial encounter: Secondary | ICD-10-CM

## 2023-04-12 DIAGNOSIS — D649 Anemia, unspecified: Secondary | ICD-10-CM

## 2023-04-12 DIAGNOSIS — M109 Gout, unspecified: Secondary | ICD-10-CM | POA: Diagnosis not present

## 2023-04-12 DIAGNOSIS — E7849 Other hyperlipidemia: Secondary | ICD-10-CM

## 2023-04-12 DIAGNOSIS — E119 Type 2 diabetes mellitus without complications: Secondary | ICD-10-CM

## 2023-04-12 LAB — CBC
HCT: 26.7 % — ABNORMAL LOW (ref 36.0–46.0)
Hemoglobin: 8.5 g/dL — ABNORMAL LOW (ref 12.0–15.0)
MCH: 28.9 pg (ref 26.0–34.0)
MCHC: 31.8 g/dL (ref 30.0–36.0)
MCV: 90.8 fL (ref 80.0–100.0)
Platelets: 168 10*3/uL (ref 150–400)
RBC: 2.94 MIL/uL — ABNORMAL LOW (ref 3.87–5.11)
RDW: 12.9 % (ref 11.5–15.5)
WBC: 9.9 10*3/uL (ref 4.0–10.5)
nRBC: 0 % (ref 0.0–0.2)

## 2023-04-12 LAB — GLUCOSE, CAPILLARY
Glucose-Capillary: 158 mg/dL — ABNORMAL HIGH (ref 70–99)
Glucose-Capillary: 100 mg/dL — ABNORMAL HIGH (ref 70–99)
Glucose-Capillary: 139 mg/dL — ABNORMAL HIGH (ref 70–99)
Glucose-Capillary: 78 mg/dL (ref 70–99)

## 2023-04-12 MED ORDER — SODIUM CHLORIDE (PF) 0.9% IJ SOLUTION - NO CHARGE
10.0000 mL | Freq: Once | INTRAMUSCULAR | Status: AC
  Administered 2023-04-12: 3 mL

## 2023-04-12 MED ORDER — LIDOCAINE HCL (PF) 1 % IJ SOLN
5.0000 mL | Freq: Once | INTRAMUSCULAR | Status: AC
  Administered 2023-04-12: 5 mL via INTRADERMAL

## 2023-04-12 NOTE — Consult Note (Signed)
 PCP:   Dwight Trula SQUIBB, MD   Chief Complaint:  Cardiac clearance  HPI: This is a 78 year old female with past medical history of T2DM, HTN, HLD, gout.  Patient admitted to/08/2023 after a fall and dislocation of her left hip.  Unsuccessful ED attempted hip reduction done in the ER.  Patient was taken to the OR on 12/6 where the hip was reduced under conscious sedation.  Hip was aspirated.  Cultures pending.  The patient's hip redislocated immediately postop in the PACU.  Hospitalist service consulted for cardiac clearance.  Ortho plans total hip replacement on Monday.  The patient's most recent surgery was 02/2023 [two months ago].  She had decompressive lumbar laminectomy.  Patient tolerated general anesthesia without issues.  She has no cardiac history.  She has never had congestive heart failure.  She denies chest pains.  The patient goes to go the gym twice weekly ~ an hour.  She does bicycle riding and dance without issues.  She maintains her home and gardening.  Patient is cleared for surgery  Review of Systems:  Per HPI  Past Medical History: Past Medical History:  Diagnosis Date   Arthritis    Diabetes mellitus without complication (HCC)    Gout    HTN (hypertension)    Hyperlipidemia    Past Surgical History:  Procedure Laterality Date   ABDOMINAL HYSTERECTOMY     CESAREAN SECTION     HIP CLOSED REDUCTION Left 04/10/2023   Procedure: CLOSED REDUCTION HIP;  Surgeon: Edna Toribio LABOR, MD;  Location: [facility] OR;  Service: Orthopedics;  Laterality: Left;   JOINT REPLACEMENT Bilateral    hips    Medications: Prior to Admission medications   Medication Sig Start Date End Date Taking? Authorizing Provider  allopurinol  (ZYLOPRIM ) 300 MG tablet Take 300 mg by mouth daily. 03/05/18  Yes [provider]  amLODipine  (NORVASC ) 5 MG tablet Take 5 mg by mouth daily.   Yes [provider]  atenolol  (TENORMIN ) 100 MG tablet Take 100 mg by mouth daily.   Yes [provider]  colchicine  0.6 MG tablet Take 0.6 mg by mouth daily. 02/12/23  Yes [provider]  furosemide  (LASIX ) 20 MG tablet Take 20 mg by mouth daily. 11/15/16  Yes [provider]  lisinopril -hydrochlorothiazide  (PRINZIDE ,ZESTORETIC ) 20-25 MG tablet Take 1 tablet by mouth daily. 03/17/18  Yes [provider]  meclizine  (ANTIVERT ) 25 MG tablet Take 1 tablet (25 mg total) by mouth 3 (three) times daily as needed for dizziness. 10/21/22  Yes Laurice Maude BROCKS, MD  metFORMIN  (GLUCOPHAGE ) 500 MG tablet Take 1,000 mg by mouth 2 (two) times daily with a meal. 1,000mg  in AM and 500mg  in PM   Yes [provider]  moxifloxacin (VIGAMOX) 0.5 % ophthalmic solution Place 1 drop into the right eye 4 (four) times daily. 03/16/19  Yes [provider]  ondansetron  (ZOFRAN ) 4 MG tablet Take 1 tablet (4 mg total) by mouth every 6 (six) hours. Patient taking differently: Take 4 mg by mouth every 8 (eight) hours as needed for nausea or vomiting. 10/21/22  Yes Laurice Maude BROCKS, MD  oxyCODONE -acetaminophen  (PERCOCET/ROXICET) 5-325 MG tablet Take 1 tablet by mouth every 6 (six) hours as needed for moderate pain (pain score 4-6). 02/28/23  Yes [provider]  potassium chloride  (MICRO-K ) 10 MEQ CR capsule Take 10 mEq by mouth 2 (two) times daily. 01/20/20  Yes [provider]  rosuvastatin  (CRESTOR ) 10 MG tablet Take 5 mg by mouth once. 05/04/20  Yes [provider]  Vitamin D, Ergocalciferol, (DRISDOL) 1.25 MG (50000 UNIT) CAPS capsule Take 50,000 Units by mouth once a week. 04/03/23  Yes [provider]  ONETOUCH VERIO test strip  05/08/18   [provider]    Allergies:   Allergies  Allergen Reactions   Atorvastatin     Other reaction(s): myalgias    Social History:  reports that she has never smoked. She has never used smokeless tobacco. She reports that she does not drink alcohol  and does not use drugs.  Family History: Family  History  Problem Relation Age of Onset   Breast cancer Neg Hx     Physical Exam: Vitals:   04/12/23 0912 04/12/23 1627 04/12/23 1715 04/12/23 2045  BP: 128/66 118/61 (!) 145/62 (!) 146/59  Pulse: (!) 52 62 63 60  Resp: 15 14 18 16   Temp: 98 F (36.7 C) 98.2 F (36.8 C) 97.9 F (36.6 C) 98.2 F (36.8 C)  TempSrc: Oral Oral Oral Oral  SpO2: 98% 98% 100% 99%  Weight:      Height:        General:  A&Ox3, well developed and nourished, no acute distress Eyes: Pink conjunctiva, no scleral icterus ENT: Moist oral mucosa, neck supple, no thyromegaly Lungs: CTA B/L, no wheeze, no crackles, no use of accessory muscles Cardiovascular: RRR, no regurgitation, no bruits, no JVD Abdomen: soft, positive BS, NTND, not an acute abdomen GU: not examined Neuro: CN II - XII grossly intact, sensation intact Musculoskeletal: LLE immobilized.  No edema Skin: no rash, no subcutaneous crepitation, no decubitus Psych: appropriate patient   Labs on Admission:  Recent Labs    04/10/23 1236 04/11/23 0433  NA 139 138  K 3.7 4.1  CL 104 106  CO2 19* 23  GLUCOSE 134* 207*  BUN 26* 25*  CREATININE 1.20* 1.10*  CALCIUM  11.7* 10.3    Recent Labs    04/10/23 1236 04/11/23 0433 04/12/23 0401  WBC 9.7 5.9 9.9  NEUTROABS 7.7  --   --   HGB 10.7* 9.1* 8.5*  HCT 32.7* 28.4* 26.7*  MCV 88.9 89.9 90.8  PLT 246 182 168    Micro Results: Recent Results (from the past 240 hours)  Body fluid culture w Gram Stain     Status: None (Preliminary result)   Collection Time: 04/12/23  1:03 PM   Specimen: PATH Cytology Misc. fluid; Synovial Fluid  Result Value Ref Range Status   Specimen Description SYNOVIAL  Final   Special Requests NONE  Final   Gram Stain   Final    NO WBC SEEN NO ORGANISMS SEEN Performed at [facility], [address], [place], [place] [address]    Culture PENDING  Incomplete   Report Status PENDING  Incomplete     Radiological Exams on Admission: DG FL ASP/INJ  MAJOR (SHOULDER, HIP, KNEE) Result Date: 04/12/2023 CLINICAL DATA:  CLINICAL DATA Patient with prior left hip replacement s/p fall with dislocation. Joint aspiration requested. EXAM: DIAGNOSTIC LEFT HIP ASPIRATION UNDER FLUOROSCOPIC GUIDANCE FLUOROSCOPY TIME:  Radiation Exposure Index (if provided by the fluoroscopic device): 4.7 PROCEDURE: The risks and benefits of the procedure were discussed with the patient, and written informed consent was obtained. The patient stated no history of allergy to contrast media. A formal timeout procedure was performed with the patient according to departmental protocol. The patient was placed supine on the fluoroscopy table and the left replacement hardware was identified under fluoroscopy. The skin overlying the space  was subsequently cleaned with betadine  and a sterile drape was placed over the area of interest. 5 ml 1% Lidocaine  was used to anesthetize the skin around the needle insertion site. Initial attempts by a 20 gauge spinal needle inserted into the left hip to the level of the replacement hardware yielded no fluid despite position confirmation under fluoroscopy. 2 mL of normal saline was then used to irrigate the space and 0.5 mL bloody fluid was then aspirated. The needle was removed and hemostasis was achieved. Sample sent to the lab for testing. IMPRESSION: Technically successful left replacement hip aspiration. Performed by: Kacie Matthews PA-C Electronically Signed   By: Wilkie Lent M.D.   On: 04/12/2023 13:04   CT HIP LEFT WO CONTRAST Result Date: 04/11/2023 CLINICAL DATA:  Hip dislocation.  Postreduction. EXAM: CT OF THE LEFT HIP WITHOUT CONTRAST TECHNIQUE: Multidetector CT imaging of the left hip was performed according to the standard protocol. Multiplanar CT image reconstructions were also generated. RADIATION DOSE REDUCTION: This exam was performed according to the departmental dose-optimization program which includes automated exposure control,  adjustment of the mA and/or kV according to patient size and/or use of iterative reconstruction technique. COMPARISON:  Left hip radiographs 04/10/2023, intraoperative fluoroscopy left hip arthroplasty closed reduction 04/10/2023, AP left hip 04/10/2023, AP pelvis and lateral view of the left hip 04/10/2023, pelvis and left hip radiographs 06/14/2022; CT chest, abdomen, and pelvis 02/06/2018 FINDINGS: Bones/Joint/Cartilage Postsurgical changes are seen of prior total left hip arthroplasty. There is streak artifact around the hardware that somewhat limits evaluation of the prosthetic borders. Within this limitation, no perihardware lucency is seen around the femoral stem component. There are lucencies around the acetabular cup measuring up to 6 mm in transverse thickness superiorly (coronal series 6, image 15) and greater lucency around the inferior aspect of the right acetabular cup measuring up to 9 mm in thickness (coronal series 6, image 57). There is up to 2 mm lucency around the more inferior posterior acetabular cup screw (axial series 3, image 65 and coronal series 6, image 47). These perihardware lucencies are new from 02/06/2018. There is no abnormal lucency seen around the superior acetabular screw (axial image 51 and coronal image 51). The femoral head prosthesis is directed anteriorly, and is laterally, anteriorly, superiorly subluxed from the expected seen within the acetabular cup (axial image 58, coronal image 67, sagittal image 52). Ligaments Suboptimally assessed by CT. Muscles and Tendons Normal size and density of the regional musculature. No gross tendon tear is visualized. Soft tissues Mild stranding within the lateral left hip subcutaneous fat. Mild-to-moderate distal sigmoid descending colon and sigmoid partially visualized diverticulosis. Moderate to high-grade atherosclerotic calcifications. 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 IMPRESSION: 1. Postsurgical changes of prior total left hip arthroplasty. The femoral head prosthesis is directed anteriorly, and is laterally, anteriorly, superiorly subluxed from the expected seen within the acetabular cup. 2. There are lucencies around the acetabular cup measuring up to 6 mm in transverse thickness superiorly and around the inferior aspect of the right acetabular cup measuring up to 9 mm in thickness. There is up to 2 mm lucency around the more inferior posterior acetabular cup screw. These perihardware lucencies are new from 02/06/2018 CT and are concerning for hardware loosening. Electronically Signed   By: Tanda Lyons M.D.   On: 04/11/2023 13:27    Assessment/Plan Present on Admission:  Cardiac clearance -Patient medically cleared for procedure  Essential hypertension -Norvasc  resumed, increase to 10 mg daily -Lisinopril /HCTZ on hold.  Continue hold with patient's AKI.  Gentle IV fluid hydration initiated. -Metoprolol on hold with patient's relative bradycardia   AKI -IV fluid hydration.  BMP in a.m.   Anemia -Patient hemoglobin trending down 12.6 [8/90/24], currently 8.5.  Outpatient follow-up.  Anemia panel. -Transfuse if hemoglobin less than 7   Gout -Allopurinol  continued   T2DM -Sign scale insulin    HLD -Crestor  resumed   Closed dislocation of left hip, initial encounter [facility]) -Per orthopedic  Kabir Brannock 04/12/2023, 11:33 PM

## 2023-04-12 NOTE — Progress Notes (Signed)
     Subjective:  Patient reports pain as moderate.  No overnight events.  Still plan for hip revision surgery likely Monday.    Objective:   VITALS:   Vitals:   04/11/23 1124 04/11/23 1630 04/11/23 2057 04/12/23 0423  BP: (!) 110/54 (!) 110/57 (!) 124/58 (!) 141/75  Pulse: 66 60 65 64  Resp: 20 20 19 18   Temp: 97.8 F (36.6 C) 98 F (36.7 C) (!) 97.5 F (36.4 C) 97.9 F (36.6 C)  TempSrc: Oral Oral Oral Oral  SpO2: 96% 95% 98% 100%  Weight:      Height:        AAOx4 resting comfortably Sensation intact distally Intact pulses distally Dorsiflexion/Plantar flexion intact ROM of hip deferred due to known dislocation Skin intact Wiggles toes appropriately    Lab Results  Component Value Date   WBC 9.9 04/12/2023   HGB 8.5 (L) 04/12/2023   HCT 26.7 (L) 04/12/2023   MCV 90.8 04/12/2023   PLT 168 04/12/2023   BMET    Component Value Date/Time   NA 138 04/11/2023 0433   K 4.1 04/11/2023 0433   CL 106 04/11/2023 0433   CO2 23 04/11/2023 0433   GLUCOSE 207 (H) 04/11/2023 0433   BUN 25 (H) 04/11/2023 0433   CREATININE 1.10 (H) 04/11/2023 0433   CALCIUM  10.3 04/11/2023 0433   GFRNONAA 52 (L) 04/11/2023 0433      Xray: PACU xray show recurrent dislocation of left hip prosthesis  Assessment/Plan: 2 Days Post-Op   Principal Problem:   Closed dislocation of left hip, initial encounter (HCC)  Left hip instability  Unforunately despite closed reduction the hip has redislocated. I think she is unstable at this point and will need an open reduction with component revision. CT hip pending, plan to obtain implant records. Still plan for revision likely Monday.  NPO past midnight Sunday.    Bernarda CHRISTELLA Mclean 04/12/2023, 7:30 AM   Contact information:   [email or web address] 7am-5pm epic message Dr. Edna, or call office for patient follow up: [phone] After hours and holidays please check [email or web address] for group call information for [facility]

## 2023-04-13 DIAGNOSIS — S73005A Unspecified dislocation of left hip, initial encounter: Secondary | ICD-10-CM | POA: Diagnosis not present

## 2023-04-13 LAB — GLUCOSE, CAPILLARY
Glucose-Capillary: 86 mg/dL (ref 70–99)
Glucose-Capillary: 111 mg/dL — ABNORMAL HIGH (ref 70–99)
Glucose-Capillary: 167 mg/dL — ABNORMAL HIGH (ref 70–99)

## 2023-04-13 LAB — COMPREHENSIVE METABOLIC PANEL
ALT: 10 U/L (ref 0–44)
AST: 11 U/L — ABNORMAL LOW (ref 15–41)
Albumin: 3 g/dL — ABNORMAL LOW (ref 3.5–5.0)
Alkaline Phosphatase: 73 U/L (ref 38–126)
Anion gap: 7 (ref 5–15)
BUN: 21 mg/dL (ref 8–23)
CO2: 26 mmol/L (ref 22–32)
Calcium: 10.4 mg/dL — ABNORMAL HIGH (ref 8.9–10.3)
Chloride: 106 mmol/L (ref 98–111)
Creatinine, Ser: 0.84 mg/dL (ref 0.44–1.00)
GFR, Estimated: >60 mL/min (ref >60)
Glucose, Bld: 94 mg/dL (ref 70–99)
Potassium: 3.8 mmol/L (ref 3.5–5.1)
Sodium: 139 mmol/L (ref 135–145)
Total Bilirubin: 0.5 mg/dL (ref 0.0–1.2)
Total Protein: 5.5 g/dL — ABNORMAL LOW (ref 6.5–8.1)

## 2023-04-13 LAB — CBC
HCT: 30.3 % — ABNORMAL LOW (ref 36.0–46.0)
Hemoglobin: 9.3 g/dL — ABNORMAL LOW (ref 12.0–15.0)
MCH: 28.5 pg (ref 26.0–34.0)
MCHC: 30.7 g/dL (ref 30.0–36.0)
MCV: 92.9 fL (ref 80.0–100.0)
Platelets: 169 10*3/uL (ref 150–400)
RBC: 3.26 MIL/uL — ABNORMAL LOW (ref 3.87–5.11)
RDW: 13 % (ref 11.5–15.5)
WBC: 6.6 10*3/uL (ref 4.0–10.5)
nRBC: 0 % (ref 0.0–0.2)

## 2023-04-13 LAB — RETICULOCYTES
Immature Retic Fract: 10.9 % (ref 2.3–15.9)
RBC.: 3.15 MIL/uL — ABNORMAL LOW (ref 3.87–5.11)
Retic Count, Absolute: 64.3 10*3/uL (ref 19.0–186.0)
Retic Ct Pct: 2 % (ref 0.4–3.1)

## 2023-04-13 LAB — IRON AND TIBC
Iron: 48 ug/dL (ref 28–170)
Saturation Ratios: 21 % (ref 10.4–31.8)
TIBC: 234 ug/dL — ABNORMAL LOW (ref 250–450)
UIBC: 186 ug/dL

## 2023-04-13 LAB — VITAMIN B12: Vitamin B-12: 385 pg/mL (ref 180–914)

## 2023-04-13 LAB — SURGICAL PCR SCREEN
MRSA, PCR: NEGATIVE
Staphylococcus aureus: NEGATIVE

## 2023-04-13 LAB — FOLATE: Folate: 5 ng/mL — ABNORMAL LOW (ref >5.9)

## 2023-04-13 LAB — FERRITIN: Ferritin: 302 ng/mL (ref 11–307)

## 2023-04-13 MED ORDER — ALLOPURINOL 300 MG PO TABS
300.0000 mg | ORAL_TABLET | Freq: Every day | ORAL | Status: DC
  Administered 2023-04-13 – 2023-04-18 (×5): 300 mg via ORAL
  Filled 2023-04-13 (×5): qty 1

## 2023-04-13 MED ORDER — CHLORHEXIDINE GLUCONATE 4 % EX SOLN
60.0000 mL | Freq: Once | CUTANEOUS | Status: AC
  Administered 2023-04-14: 4 via TOPICAL

## 2023-04-13 MED ORDER — POVIDONE-IODINE 10 % EX SWAB: 2.0000 | Freq: Once | CUTANEOUS | Status: DC

## 2023-04-13 MED ORDER — FUROSEMIDE 20 MG PO TABS
20.0000 mg | ORAL_TABLET | Freq: Every day | ORAL | Status: DC
  Administered 2023-04-16 – 2023-04-18 (×3): 20 mg via ORAL
  Filled 2023-04-13 (×3): qty 1

## 2023-04-13 MED ORDER — ROSUVASTATIN CALCIUM 5 MG PO TABS
5.0000 mg | ORAL_TABLET | Freq: Once | ORAL | Status: AC
  Administered 2023-04-13: 5 mg via ORAL
  Filled 2023-04-13: qty 1

## 2023-04-13 MED ORDER — AMLODIPINE BESYLATE 10 MG PO TABS
10.0000 mg | ORAL_TABLET | Freq: Every day | ORAL | Status: DC
  Administered 2023-04-13: 10 mg via ORAL
  Filled 2023-04-13: qty 1

## 2023-04-13 MED ORDER — CEFAZOLIN SODIUM-DEXTROSE 2-4 GM/100ML-% IV SOLN
2.0000 g | INTRAVENOUS | Status: AC
  Administered 2023-04-14: 2 g via INTRAVENOUS
  Filled 2023-04-13: qty 100

## 2023-04-13 MED ORDER — LACTATED RINGERS IV SOLN: INTRAVENOUS | Status: DC

## 2023-04-13 MED ORDER — TRANEXAMIC ACID-NACL 1000-0.7 MG/100ML-% IV SOLN
1000.0000 mg | INTRAVENOUS | Status: AC
  Administered 2023-04-14: 1000 mg via INTRAVENOUS
  Filled 2023-04-13: qty 100

## 2023-04-13 NOTE — Plan of Care (Signed)
  Problem: Education: Goal: Knowledge of General Education information will improve Description: Including pain rating scale, medication(s)/side effects and non-pharmacologic comfort measures Outcome: Progressing   Problem: Health Behavior/Discharge Planning: Goal: Ability to manage health-related needs will improve Outcome: Progressing   Problem: Clinical Measurements: Goal: Ability to maintain clinical measurements within normal limits will improve Outcome: Progressing Goal: Will remain free from infection Outcome: Progressing Goal: Diagnostic test results will improve Outcome: Progressing Goal: Respiratory complications will improve Outcome: Progressing Goal: Cardiovascular complication will be avoided Outcome: Progressing   Problem: Activity: Goal: Risk for activity intolerance will decrease Outcome: Progressing   Problem: Nutrition: Goal: Adequate nutrition will be maintained Outcome: Progressing   Problem: Coping: Goal: Level of anxiety will decrease Outcome: Progressing   Problem: Elimination: Goal: Will not experience complications related to bowel motility Outcome: Progressing Goal: Will not experience complications related to urinary retention Outcome: Progressing   Problem: Pain Managment: Goal: General experience of comfort will improve and/or be controlled Outcome: Progressing   Problem: Safety: Goal: Ability to remain free from injury will improve Outcome: Progressing   Problem: Skin Integrity: Goal: Risk for impaired skin integrity will decrease Outcome: Progressing   Problem: Education: Goal: Knowledge of General Education information will improve Description: Including pain rating scale, medication(s)/side effects and non-pharmacologic comfort measures Outcome: Progressing   Problem: Health Behavior/Discharge Planning: Goal: Ability to manage health-related needs will improve Outcome: Progressing   Problem: Clinical Measurements: Goal:  Ability to maintain clinical measurements within normal limits will improve Outcome: Progressing   Problem: Skin Integrity: Goal: Risk for impaired skin integrity will decrease Outcome: Progressing   Problem: Skin Integrity: Goal: Risk for impaired skin integrity will decrease Outcome: Progressing   Problem: Tissue Perfusion: Goal: Adequacy of tissue perfusion will improve Outcome: Progressing   Problem: Education: Goal: Knowledge of the prescribed therapeutic regimen will improve Outcome: Progressing Goal: Individualized Educational Video(s) Outcome: Completed/Met   Problem: Activity: Goal: Ability to avoid complications of mobility impairment will improve Outcome: Progressing Goal: Ability to tolerate increased activity will improve Outcome: Progressing

## 2023-04-13 NOTE — Progress Notes (Addendum)
 PROGRESS NOTE  Cynthia Torres  FMW:[id number] DOB: 1945-03-29 DOA: 04/10/2023 PCP: Dwight Trula SQUIBB, MD  Consultants  Brief Narrative: 78 year old female with past medical history of T2DM, HTN, HLD, gout.  Patient admitted to/08/2023 after a fall and dislocation of her left hip.  Unsuccessful ED attempted hip reduction done in the ER.  Patient was taken to the OR on 2/6 where the hip was reduced under conscious sedation.  Hip was aspirated.  Cultures pending.  The patient's hip redislocated immediately postop in the PACU.  Hospitalist service consulted for cardiac clearance.  Ortho plans total hip replacement on Monday 2/10.    Assessment & Plan: Hip surgery: - The patient's most recent surgery was 02/2023 [two months ago].  She had decompressive lumbar laminectomy.   - Patient tolerated general anesthesia without issues.   - She has no cardiac history.  She has never had congestive heart failure.  She denies chest pains.  The patient goes to go the gym twice weekly ~ an hour.  She does bicycle riding and dance without issues.  She maintains her home and gardening.   -No history of kidney disease. -EKG shows right bundle branch block plus left fascicular block.  Despite this, patient appears low risk for major cardiac events with this particular surgery.      Essential hypertension -Norvasc  resumed, increase to 10 mg daily -Lisinopril /HCTZ on hold.  Continue hold with patient's AKI.  Gentle IV fluid hydration initiated. -Metoprolol on hold with patient's relative bradycardia    AKI -IV fluid hydration.   -Resolved.  No history of kidney disease prior.    Anemia -Anemia panel pending. - hemoglobin was downtrending.  Now at 9.3.  Improved from 8 yesterday. -Transfuse if hemoglobin less than 7    Gout -Allopurinol  continued    T2DM -Sign scale insulin     HLD -Crestor  resumed    Closed dislocation of left hip, initial encounter [facility]) -Per orthopedic team.    DVT prophylaxis:  SCDs  Start: 04/10/23 2008  Code Status:   Code Status: Full Code Family Communication: Daughter and friend present at bedside. Level of care: Med-Surg Status is: Inpatient Remains inpatient appropriate because: Surgery planned tomorrow   Consults called: Orthopedics  Subjective: Awake and alert.  Only complains of pain when attempting to move in bed but not at rest.  Eating and drinking well.  She is ready for surgery tomorrow.  Objective: Vitals:   04/13/23 0125 04/13/23 0530 04/13/23 0949 04/13/23 1324  BP: (!) 152/73 137/71 130/72 124/62  Pulse: (!) 57 (!) 57 60 (!) 59  Resp: 16 16 16 16   Temp: 98.5 F (36.9 C) 97.8 F (36.6 C) 98 F (36.7 C) 97.6 F (36.4 C)  TempSrc:   Oral Oral  SpO2: 100% 99% 98% 100%  Weight:      Height:        Intake/Output Summary (Last 24 hours) at 04/13/2023 1526 Last data filed at 04/13/2023 0900 Gross per 24 hour  Intake 917.52 ml  Output 1000 ml  Net -82.48 ml   Filed Weights   04/10/23 1226  Weight: 74.8 kg   Body mass index is 33.33 kg/m.  Gen: 78 y.o. female in no apparent distress.  Nontoxic Pulm: Non-labored breathing.  Clear to auscultation bilaterally.  CV: Regular rate and rhythm. No murmur, rub, or gallop. No JVD GI: Abdomen soft, non-tender, non-distended, with normoactive bowel sounds. No organomegaly or masses felt. Ext: Warm, no deformities, no pedal edema.  Good pulses distal  bilateral lower extremities Skin: No rashes, lesions no ulcers Neuro: Alert and oriented. No focal neurological deficits. Psych: Calm  Judgement and insight appear normal. Mood & affect appropriate.     I have personally reviewed the following labs and images: CBC: Recent Labs  Lab 04/10/23 1236 04/11/23 0433 04/12/23 0401 04/13/23 0827  WBC 9.7 5.9 9.9 6.6  NEUTROABS 7.7  --   --   --   HGB 10.7* 9.1* 8.5* 9.3*  HCT 32.7* 28.4* 26.7* 30.3*  MCV 88.9 89.9 90.8 92.9  PLT 246 182 168 169   BMP &GFR Recent Labs  Lab 04/10/23 1236  04/11/23 0433 04/13/23 0827  NA 139 138 139  K 3.7 4.1 3.8  CL 104 106 106  CO2 19* 23 26  GLUCOSE 134* 207* 94  BUN 26* 25* 21  CREATININE 1.20* 1.10* 0.84  CALCIUM  11.7* 10.3 10.4*   Estimated Creatinine Clearance: 49.4 mL/min (by C-G formula based on SCr of 0.84 mg/dL). Liver & Pancreas: Recent Labs  Lab 04/13/23 0827  AST 11*  ALT 10  ALKPHOS 73  BILITOT 0.5  PROT 5.5*  ALBUMIN  3.0*   No results for input(s): LIPASE, AMYLASE in the last 168 hours. No results for input(s): AMMONIA in the last 168 hours. Diabetic: No results for input(s): HGBA1C in the last 72 hours. Recent Labs  Lab 04/12/23 1127 04/12/23 1710 04/12/23 2051 04/13/23 0821 04/13/23 1107  GLUCAP 100* 139* 78 86 111*   Cardiac Enzymes: No results for input(s): CKTOTAL, CKMB, CKMBINDEX, TROPONINI in the last 168 hours. No results for input(s): PROBNP in the last 8760 hours. Coagulation Profile: No results for input(s): INR, PROTIME in the last 168 hours. Thyroid  Function Tests: No results for input(s): TSH, T4TOTAL, FREET4, T3FREE, THYROIDAB in the last 72 hours. Lipid Profile: No results for input(s): CHOL, HDL, LDLCALC, TRIG, CHOLHDL, LDLDIRECT in the last 72 hours. Anemia Panel: Recent Labs    04/13/23 0321  VITAMINB12 385  FOLATE 5.0*  FERRITIN 302  TIBC 234*  IRON 48  RETICCTPCT 2.0   Urine analysis:    Component Value Date/Time   COLORURINE STRAW (A) 10/21/2022 1603   APPEARANCEUR CLEAR 10/21/2022 1603   LABSPEC 1.008 10/21/2022 1603   PHURINE 7.0 10/21/2022 1603   GLUCOSEU NEGATIVE 10/21/2022 1603   HGBUR NEGATIVE 10/21/2022 1603   BILIRUBINUR NEGATIVE 10/21/2022 1603   KETONESUR NEGATIVE 10/21/2022 1603   PROTEINUR NEGATIVE 10/21/2022 1603   NITRITE NEGATIVE 10/21/2022 1603   LEUKOCYTESUR TRACE (A) 10/21/2022 1603   Sepsis Labs: Invalid input(s): PROCALCITONIN, LACTICIDVEN  Microbiology: Recent Results (from the past 240  hours)  Body fluid culture w Gram Stain     Status: None (Preliminary result)   Collection Time: 04/12/23  1:03 PM   Specimen: PATH Cytology Misc. fluid; Synovial Fluid  Result Value Ref Range Status   Specimen Description SYNOVIAL  Final   Special Requests NONE  Final   Gram Stain NO WBC SEEN NO ORGANISMS SEEN   Final   Culture   Final    NO GROWTH < 24 HOURS Performed at [facility], [address] [place], [place] [address]    Report Status PENDING  Incomplete    Radiology Studies: No results found.  Scheduled Meds:  allopurinol   300 mg Oral Daily   amLODipine   10 mg Oral Daily   aspirin   81 mg Oral BID   colchicine   0.6 mg Oral Daily   docusate sodium   100 mg Oral BID   [  START ON 04/15/2023] furosemide   20 mg Oral Daily   insulin  aspart  0-15 Units Subcutaneous TID WC   insulin  aspart  0-5 Units Subcutaneous QHS   insulin  aspart  4 Units Subcutaneous TID WC   pantoprazole   40 mg Oral Daily   senna  1 tablet Oral BID   Continuous Infusions:  lactated ringers  75 mL/hr at 04/13/23 0316     LOS: 2 days   35 minutes with more than 50% spent in reviewing records, counseling patient/family and coordinating care.  Reyes VEAR Gaw, MD [facility] [email or web address] 04/13/2023, 3:26 PM

## 2023-04-13 NOTE — Plan of Care (Signed)
   Problem: Education: Goal: Knowledge of General Education information will improve Description Including pain rating scale, medication(s)/side effects and non-pharmacologic comfort measures Outcome: Progressing

## 2023-04-13 NOTE — Progress Notes (Signed)
     Subjective:  Patient reports pain as mild.  She feels like she needs to void.  Objective:   VITALS:   Vitals:   04/13/23 0125 04/13/23 0530 04/13/23 0949 04/13/23 1324  BP: (!) 152/73 137/71 130/72 124/62  Pulse: (!) 57 (!) 57 60 (!) 59  Resp: 16 16 16 16   Temp: 98.5 F (36.9 C) 97.8 F (36.6 C) 98 F (36.7 C) 97.6 F (36.4 C)  TempSrc:   Oral Oral  SpO2: 100% 99% 98% 100%  Weight:      Height:        EHL and FHL are intact bilaterally.  Lab Results  Component Value Date   WBC 6.6 04/13/2023   HGB 9.3 (L) 04/13/2023   HCT 30.3 (L) 04/13/2023   MCV 92.9 04/13/2023   PLT 169 04/13/2023   BMET    Component Value Date/Time   NA 139 04/13/2023 0827   K 3.8 04/13/2023 0827   CL 106 04/13/2023 0827   CO2 26 04/13/2023 0827   GLUCOSE 94 04/13/2023 0827   BUN 21 04/13/2023 0827   CREATININE 0.84 04/13/2023 0827   CALCIUM  10.4 (H) 04/13/2023 0827   GFRNONAA >60 04/13/2023 0827     Assessment/Plan: 3 Days Post-Op   Principal Problem:   Closed dislocation of left hip, initial encounter (HCC) Active Problems:   Essential hypertension   Gout   HLD (hyperlipidemia)   Primary hyperparathyroidism (HCC)   T2DM (type 2 diabetes mellitus) (HCC)   Plan for revision arthroplasty tomorrow with Dr. Edna.  N.p.o. after midnight tonight.  We will try bedpan, I am not sure if she needs a catheter.   Cynthia Torres 04/13/2023, 3:49 PM   Cynthia Olmsted, MD Cell [phone]

## 2023-04-14 ENCOUNTER — Inpatient Hospital Stay (HOSPITAL_COMMUNITY): Payer: Medicare Other | Admitting: Anesthesiology

## 2023-04-14 ENCOUNTER — Encounter (HOSPITAL_COMMUNITY)
Admission: EM | Disposition: A | Discharge status: Skilled Nursing Facility | Payer: Self-pay | Source: Ambulatory Visit | Attending: Orthopedic Surgery

## 2023-04-14 ENCOUNTER — Inpatient Hospital Stay (HOSPITAL_COMMUNITY): Payer: Medicare Other

## 2023-04-14 ENCOUNTER — Other Ambulatory Visit: Payer: Self-pay

## 2023-04-14 ENCOUNTER — Encounter (HOSPITAL_COMMUNITY): Payer: Self-pay | Admitting: Orthopedic Surgery

## 2023-04-14 DIAGNOSIS — S73005A Unspecified dislocation of left hip, initial encounter: Secondary | ICD-10-CM | POA: Diagnosis not present

## 2023-04-14 DIAGNOSIS — T84021A Dislocation of internal left hip prosthesis, initial encounter: Secondary | ICD-10-CM | POA: Diagnosis not present

## 2023-04-14 HISTORY — PX: TOTAL HIP REVISION: SHX763

## 2023-04-14 LAB — GLUCOSE, CAPILLARY
Glucose-Capillary: 100 mg/dL — ABNORMAL HIGH (ref 70–99)
Glucose-Capillary: 82 mg/dL (ref 70–99)
Glucose-Capillary: 156 mg/dL — ABNORMAL HIGH (ref 70–99)
Glucose-Capillary: 154 mg/dL — ABNORMAL HIGH (ref 70–99)
Glucose-Capillary: 226 mg/dL — ABNORMAL HIGH (ref 70–99)

## 2023-04-14 LAB — CBC
HCT: 28.6 % — ABNORMAL LOW (ref 36.0–46.0)
Hemoglobin: 8.9 g/dL — ABNORMAL LOW (ref 12.0–15.0)
MCH: 29 pg (ref 26.0–34.0)
MCHC: 31.1 g/dL (ref 30.0–36.0)
MCV: 93.2 fL (ref 80.0–100.0)
Platelets: 156 10*3/uL (ref 150–400)
RBC: 3.07 MIL/uL — ABNORMAL LOW (ref 3.87–5.11)
RDW: 12.8 % (ref 11.5–15.5)
WBC: 6.4 10*3/uL (ref 4.0–10.5)
nRBC: 0 % (ref 0.0–0.2)

## 2023-04-14 LAB — BASIC METABOLIC PANEL
Anion gap: 6 (ref 5–15)
BUN: 17 mg/dL (ref 8–23)
CO2: 24 mmol/L (ref 22–32)
Calcium: 9.9 mg/dL (ref 8.9–10.3)
Chloride: 108 mmol/L (ref 98–111)
Creatinine, Ser: 1.14 mg/dL — ABNORMAL HIGH (ref 0.44–1.00)
GFR, Estimated: 50 mL/min — ABNORMAL LOW (ref >60)
Glucose, Bld: 129 mg/dL — ABNORMAL HIGH (ref 70–99)
Potassium: 3.7 mmol/L (ref 3.5–5.1)
Sodium: 138 mmol/L (ref 135–145)

## 2023-04-14 LAB — HEMOGLOBIN AND HEMATOCRIT, BLOOD
HCT: 25.5 % — ABNORMAL LOW (ref 36.0–46.0)
Hemoglobin: 7.7 g/dL — ABNORMAL LOW (ref 12.0–15.0)

## 2023-04-14 SURGERY — TOTAL HIP REVISION
Anesthesia: Monitor Anesthesia Care | Site: Hip | Laterality: Left

## 2023-04-14 MED ORDER — FENTANYL CITRATE (PF) 100 MCG/2ML IJ SOLN
INTRAMUSCULAR | Status: AC
  Filled 2023-04-14: qty 2

## 2023-04-14 MED ORDER — SODIUM CHLORIDE (PF) 0.9 % IJ SOLN
INTRAMUSCULAR | Status: AC
  Filled 2023-04-14: qty 30

## 2023-04-14 MED ORDER — ONDANSETRON HCL 4 MG/2ML IJ SOLN
INTRAMUSCULAR | Status: DC | PRN
  Administered 2023-04-14: 4 mg via INTRAVENOUS

## 2023-04-14 MED ORDER — CEFAZOLIN SODIUM-DEXTROSE 2-4 GM/100ML-% IV SOLN
2.0000 g | Freq: Three times a day (TID) | INTRAVENOUS | Status: AC
  Administered 2023-04-14 – 2023-04-17 (×8): 2 g via INTRAVENOUS
  Filled 2023-04-14 (×8): qty 100

## 2023-04-14 MED ORDER — EPHEDRINE SULFATE (PRESSORS) 50 MG/ML IJ SOLN
INTRAMUSCULAR | Status: DC | PRN
  Administered 2023-04-14: 5 mg via INTRAVENOUS

## 2023-04-14 MED ORDER — WATER FOR IRRIGATION, STERILE IR SOLN
Status: DC | PRN
  Administered 2023-04-14: 1000 mL

## 2023-04-14 MED ORDER — LIDOCAINE HCL (CARDIAC) PF 100 MG/5ML IV SOSY
PREFILLED_SYRINGE | INTRAVENOUS | Status: DC | PRN
  Administered 2023-04-14: 60 mg via INTRAVENOUS

## 2023-04-14 MED ORDER — PROPOFOL 10 MG/ML IV BOLUS
INTRAVENOUS | Status: AC
  Filled 2023-04-14: qty 20

## 2023-04-14 MED ORDER — PHENYLEPHRINE HCL (PRESSORS) 10 MG/ML IV SOLN
INTRAVENOUS | Status: DC | PRN
  Administered 2023-04-14 (×2): 120 ug via INTRAVENOUS

## 2023-04-14 MED ORDER — PROPOFOL 10 MG/ML IV BOLUS
INTRAVENOUS | Status: DC | PRN
  Administered 2023-04-14: 110 mg via INTRAVENOUS

## 2023-04-14 MED ORDER — DEXAMETHASONE SODIUM PHOSPHATE 10 MG/ML IJ SOLN
INTRAMUSCULAR | Status: DC | PRN
  Administered 2023-04-14: 5 mg via INTRAVENOUS

## 2023-04-14 MED ORDER — METHOCARBAMOL 500 MG PO TABS
ORAL_TABLET | ORAL | Status: AC
  Administered 2023-04-14: 500 mg via ORAL
  Filled 2023-04-14: qty 1

## 2023-04-14 MED ORDER — SUGAMMADEX SODIUM 200 MG/2ML IV SOLN
INTRAVENOUS | Status: DC | PRN
  Administered 2023-04-14: 149.6 mg via INTRAVENOUS

## 2023-04-14 MED ORDER — POLYETHYLENE GLYCOL 3350 17 G PO PACK
34.0000 g | PACK | Freq: Every day | ORAL | Status: DC
  Administered 2023-04-14 – 2023-04-17 (×3): 34 g via ORAL
  Filled 2023-04-14 (×4): qty 2

## 2023-04-14 MED ORDER — SODIUM CHLORIDE 0.9 % IR SOLN
Status: DC | PRN
  Administered 2023-04-14: 1000 mL
  Administered 2023-04-14: 250 mL

## 2023-04-14 MED ORDER — ASPIRIN 81 MG PO TBEC
81.0000 mg | DELAYED_RELEASE_TABLET | Freq: Two times a day (BID) | ORAL | Status: DC
  Administered 2023-04-15 – 2023-04-18 (×7): 81 mg via ORAL
  Filled 2023-04-14 (×7): qty 1

## 2023-04-14 MED ORDER — OXYCODONE HCL 5 MG PO TABS
ORAL_TABLET | ORAL | Status: AC
  Administered 2023-04-14: 5 mg via ORAL
  Filled 2023-04-14: qty 1

## 2023-04-14 MED ORDER — OXYCODONE HCL 5 MG/5ML PO SOLN: 5.0000 mg | Freq: Once | ORAL | Status: AC | PRN

## 2023-04-14 MED ORDER — BUPIVACAINE-EPINEPHRINE 0.25% -1:200000 IJ SOLN
INTRAMUSCULAR | Status: AC
Start: 2023-04-14 — End: ?
  Filled 2023-04-14: qty 1

## 2023-04-14 MED ORDER — OXYCODONE HCL 5 MG PO TABS: 5.0000 mg | ORAL_TABLET | Freq: Once | ORAL | Status: AC | PRN

## 2023-04-14 MED ORDER — BUPIVACAINE LIPOSOME 1.3 % IJ SUSP
INTRAMUSCULAR | Status: AC
  Filled 2023-04-14: qty 20

## 2023-04-14 MED ORDER — ALBUMIN HUMAN 5 % IV SOLN
INTRAVENOUS | Status: AC
  Filled 2023-04-14: qty 500

## 2023-04-14 MED ORDER — ONDANSETRON HCL 4 MG/2ML IJ SOLN: 4.0000 mg | Freq: Four times a day (QID) | INTRAMUSCULAR | Status: DC | PRN

## 2023-04-14 MED ORDER — CEFADROXIL 500 MG PO CAPS
500.0000 mg | ORAL_CAPSULE | Freq: Two times a day (BID) | ORAL | Status: DC
  Administered 2023-04-17 – 2023-04-18 (×2): 500 mg via ORAL
  Filled 2023-04-14 (×2): qty 1

## 2023-04-14 MED ORDER — FENTANYL CITRATE (PF) 100 MCG/2ML IJ SOLN
INTRAMUSCULAR | Status: DC | PRN
  Administered 2023-04-14: 25 ug via INTRAVENOUS
  Administered 2023-04-14: 50 ug via INTRAVENOUS
  Administered 2023-04-14: 100 ug via INTRAVENOUS

## 2023-04-14 MED ORDER — OXYCODONE HCL 5 MG PO TABS: 5.0000 mg | ORAL_TABLET | ORAL | Status: DC | PRN

## 2023-04-14 MED ORDER — ACETAMINOPHEN 10 MG/ML IV SOLN
INTRAVENOUS | Status: AC
  Filled 2023-04-14: qty 100

## 2023-04-14 MED ORDER — LACTATED RINGERS IV SOLN: INTRAVENOUS | Status: DC | PRN

## 2023-04-14 MED ORDER — FENTANYL CITRATE PF 50 MCG/ML IJ SOSY
PREFILLED_SYRINGE | INTRAMUSCULAR | Status: AC
  Administered 2023-04-14: 50 ug via INTRAVENOUS
  Filled 2023-04-14: qty 2

## 2023-04-14 MED ORDER — ISOPROPYL ALCOHOL 70 % SOLN
Status: DC | PRN
  Administered 2023-04-14: 1 via TOPICAL

## 2023-04-14 MED ORDER — PHENYLEPHRINE HCL-NACL 20-0.9 MG/250ML-% IV SOLN
INTRAVENOUS | Status: DC | PRN
  Administered 2023-04-14: 50 ug/min via INTRAVENOUS

## 2023-04-14 MED ORDER — ROCURONIUM BROMIDE 100 MG/10ML IV SOLN
INTRAVENOUS | Status: DC | PRN
  Administered 2023-04-14: 60 mg via INTRAVENOUS

## 2023-04-14 MED ORDER — ALBUMIN HUMAN 5 % IV SOLN: INTRAVENOUS | Status: DC | PRN

## 2023-04-14 MED ORDER — 0.9 % SODIUM CHLORIDE (POUR BTL) OPTIME
TOPICAL | Status: DC | PRN
  Administered 2023-04-14: 1000 mL

## 2023-04-14 MED ORDER — FENTANYL CITRATE PF 50 MCG/ML IJ SOSY
25.0000 ug | PREFILLED_SYRINGE | INTRAMUSCULAR | Status: DC | PRN
  Administered 2023-04-14: 50 ug via INTRAVENOUS

## 2023-04-14 MED ORDER — SODIUM CHLORIDE (PF) 0.9 % IJ SOLN
INTRAMUSCULAR | Status: DC | PRN
  Administered 2023-04-14: 80 mL

## 2023-04-14 MED ORDER — ACETAMINOPHEN 10 MG/ML IV SOLN
INTRAVENOUS | Status: DC | PRN
  Administered 2023-04-14: 1000 mg via INTRAVENOUS

## 2023-04-14 MED ORDER — CEFAZOLIN SODIUM-DEXTROSE 2-4 GM/100ML-% IV SOLN: 2.0000 g | Freq: Three times a day (TID) | INTRAVENOUS | Status: DC

## 2023-04-14 SURGICAL SUPPLY — 78 items
ADAPTER SLEEVE UNV CTAPER PL5 (Orthopedic Implant) IMPLANT
BAG COUNTER SPONGE SURGICOUNT (BAG) IMPLANT
BAG DECANTER FOR FLEXI CONT (MISCELLANEOUS) ×1 IMPLANT
BAG ZIPLOCK 12X15 (MISCELLANEOUS) ×1 IMPLANT
BIT DRILL TRIDENT 4X40 SU (BIT) IMPLANT
BLADE SAW SAG 25X90X1.19 (BLADE) IMPLANT
BLADE SAW SGTL 81X20 HD (BLADE) ×1 IMPLANT
CANISTER WOUNDNEG PRESSURE 500 (CANNISTER) IMPLANT
CEMENT BONE SIMPLEX SPEEDSET (Cement) IMPLANT
CHLORAPREP W/TINT 26 (MISCELLANEOUS) ×2 IMPLANT
CNTNR URN SCR LID CUP LEK RST (MISCELLANEOUS) IMPLANT
COVER SURGICAL LIGHT HANDLE (MISCELLANEOUS) ×1 IMPLANT
DERMABOND ADVANCED .7 DNX12 (GAUZE/BANDAGES/DRESSINGS) ×1 IMPLANT
DRAPE 3/4 80X56 (DRAPES) ×2 IMPLANT
DRAPE C-ARM 42X120 X-RAY (DRAPES) ×1 IMPLANT
DRAPE HIP W/POCKET STRL (MISCELLANEOUS) ×1 IMPLANT
DRAPE INCISE IOBAN 66X45 STRL (DRAPES) ×1 IMPLANT
DRAPE INCISE IOBAN 85X60 (DRAPES) ×1 IMPLANT
DRAPE POUCH INSTRU U-SHP 10X18 (DRAPES) ×1 IMPLANT
DRAPE SHEET LG 3/4 BI-LAMINATE (DRAPES) ×1 IMPLANT
DRAPE U-SHAPE 47X51 STRL (DRAPES) ×1 IMPLANT
DRESSING PEEL AND PLAC PRVNA20 (GAUZE/BANDAGES/DRESSINGS) IMPLANT
DRSG AQUACEL AG ADV 3.5X10 (GAUZE/BANDAGES/DRESSINGS) ×1 IMPLANT
DRSG PEEL AND PLACE PREVENA 20 (GAUZE/BANDAGES/DRESSINGS) ×1
ELECT BLADE TIP CTD 4 INCH (ELECTRODE) ×1 IMPLANT
ELECT NDL TIP 2.8 STRL (NEEDLE) ×1 IMPLANT
ELECT NEEDLE TIP 2.8 STRL (NEEDLE) ×1 IMPLANT
ELECT REM PT RETURN 15FT ADLT (MISCELLANEOUS) ×1 IMPLANT
GAUZE SPONGE 4X4 12PLY STRL LF (GAUZE/BANDAGES/DRESSINGS) IMPLANT
GLOVE BIO SURGEON STRL SZ 6.5 (GLOVE) ×2 IMPLANT
GLOVE BIOGEL PI IND STRL 6.5 (GLOVE) ×1 IMPLANT
GLOVE BIOGEL PI IND STRL 8 (GLOVE) ×1 IMPLANT
GLOVE SURG ORTHO 8.0 STRL STRW (GLOVE) ×2 IMPLANT
GOWN STRL REUS W/ TWL XL LVL3 (GOWN DISPOSABLE) ×1 IMPLANT
HEAD FEM BIOLOX DELTA UN 28 (Orthopedic Implant) IMPLANT
HOLDER FOLEY CATH W/STRAP (MISCELLANEOUS) ×1 IMPLANT
HOOD PEEL AWAY T7 (MISCELLANEOUS) ×3 IMPLANT
KIT BASIN OR (CUSTOM PROCEDURE TRAY) ×1 IMPLANT
KIT TURNOVER KIT A (KITS) IMPLANT
LINER 42MM E (Orthopedic Implant) IMPLANT
LINER ADM MDM INS 28/48 42E (Liner) IMPLANT
MANIFOLD NEPTUNE II (INSTRUMENTS) ×1 IMPLANT
MARKER SKIN DUAL TIP RULER LAB (MISCELLANEOUS) ×1 IMPLANT
NDL HYPO 22X1.5 SAFETY MO (MISCELLANEOUS) IMPLANT
NDL SAFETY ECLIPSE 18X1.5 (NEEDLE) IMPLANT
NEEDLE HYPO 22X1.5 SAFETY MO (MISCELLANEOUS) IMPLANT
NS IRRIG 1000ML POUR BTL (IV SOLUTION) ×1 IMPLANT
PACK TOTAL JOINT (CUSTOM PROCEDURE TRAY) ×1 IMPLANT
PAD ARMBOARD 7.5X6 YLW CONV (MISCELLANEOUS) IMPLANT
PROTECTOR NERVE ULNAR (MISCELLANEOUS) ×1 IMPLANT
RETRIEVER SUT HEWSON (MISCELLANEOUS) ×1 IMPLANT
SCREW HEX LP 6.5X25 (Screw) IMPLANT
SCREW HEX LP 6.5X30 (Screw) IMPLANT
SCREW HEX LP 6.5X35 (Screw) IMPLANT
SEALER BIPOLAR AQUA 6.0 (INSTRUMENTS) IMPLANT
SET HNDPC FAN SPRY TIP SCT (DISPOSABLE) IMPLANT
SHELL MULTIHOLE ACETABULAR 52E (Miscellaneous) IMPLANT
SOLUTION IRRIG SURGIPHOR (IV SOLUTION) ×1 IMPLANT
SOLUTION PRONTOSAN WOUND 350ML (IRRIGATION / IRRIGATOR) IMPLANT
SPIKE FLUID TRANSFER (MISCELLANEOUS) ×3 IMPLANT
SUCTION TUBE FRAZIER 12FR DISP (SUCTIONS) ×1 IMPLANT
SUT BONE WAX W31G (SUTURE) ×1 IMPLANT
SUT ETHIBOND #5 BRAIDED 30INL (SUTURE) ×1 IMPLANT
SUT ETHILON 3 0 PS 1 (SUTURE) IMPLANT
SUT MNCRL AB 3-0 PS2 18 (SUTURE) ×1 IMPLANT
SUT STRATAFIX 0 PDS 27 VIOLET (SUTURE) ×1
SUT STRATAFIX 14 PDO 48 VLT (SUTURE) ×1 IMPLANT
SUT STRATAFIX PDO 1 14 VIOLET (SUTURE) ×1
SUT VIC AB 2-0 CT2 27 (SUTURE) ×2 IMPLANT
SUTURE STRATFX 0 PDS 27 VIOLET (SUTURE) ×1 IMPLANT
SYR 20ML LL LF (SYRINGE) ×2 IMPLANT
SYR 50ML LL SCALE MARK (SYRINGE) IMPLANT
TOWEL GREEN STERILE FF (TOWEL DISPOSABLE) IMPLANT
TOWEL OR 17X26 10 PK STRL BLUE (TOWEL DISPOSABLE) ×1 IMPLANT
TRAY FOLEY MTR SLVR 16FR STAT (SET/KITS/TRAYS/PACK) ×1 IMPLANT
TUBE SUCTION HIGH CAP CLEAR NV (SUCTIONS) ×1 IMPLANT
UNDERPAD 30X36 HEAVY ABSORB (UNDERPADS AND DIAPERS) ×1 IMPLANT
WATER STERILE IRR 1000ML POUR (IV SOLUTION) ×2 IMPLANT

## 2023-04-14 NOTE — Plan of Care (Signed)

## 2023-04-14 NOTE — Progress Notes (Signed)
 PROGRESS NOTE  Cynthia Torres  QQP:[id number] DOB: 06/27/1945 DOA: 04/10/2023 PCP: Tena Feeling, MD  Consultants  Brief Narrative: 78 year old female with past medical history of T2DM, HTN, HLD, gout.  Patient admitted to/08/2023 after a fall and dislocation of her left hip.  Unsuccessful ED attempted hip reduction done in the ER.  Patient was taken to the OR on 2/6 where the hip was reduced under conscious sedation.  Hip was aspirated.  Cultures pending.  The patient's hip redislocated immediately postop in the PACU.  Hospitalist service consulted for cardiac clearance.  Ortho plans total hip replacement on Monday 2/10.    Assessment & Plan:  Closed dislocation of left hip, initial encounter [facility]) Surgery today: "Left hip revision total of arthroplasty acetabular component and conversion to dual mobility construct.  application of incisional wound VAC" - pain control, bowel regimen - pt/ot consulted    Essential hypertension controlled -Norvasc  resumed, increase to 10 mg daily -Lisinopril /HCTZ on hold.  Continue hold with patient's AKI.   -Metoprolol on hold with patient's relative bradycardia    AKI -resolved with hydration    acute blood loss Anemia Baseline of around 11, trended to 7.7 today immediately after surgery, ebl was 700 - transfuse if drops below 7    Gout -Allopurinol  continued    T2DM -Sign scale insulin , mealtime    HLD -Crestor  resumed       DVT prophylaxis: aspirin  per cardiology SCDs Start: 04/10/23 2008  Code Status:   Code Status: Full Code Family Communication: Daughter at bedside. Level of care: Med-Surg Status is: Inpatient Remains inpatient appropriate because: pending pt evaluation, probably needs snf   Consults called: Orthopedics  Subjective: Tolerated surgery, mild pain left hip  Objective: Vitals:   04/14/23 1600 04/14/23 1615 04/14/23 1637 04/14/23 1646  BP: 118/67 119/60 117/67 117/67  Pulse: 68 68 75 75  Resp: 19 12 14 13    Temp:   (!) 97.4 F (36.3 C) (!) 97.4 F (36.3 C)  TempSrc:   Oral Oral  SpO2: 97% 100% 95% 95%  Weight:      Height:        Intake/Output Summary (Last 24 hours) at 04/14/2023 1716 Last data filed at 04/14/2023 1716 Gross per 24 hour  Intake 3569.49 ml  Output 1350 ml  Net 2219.49 ml   Filed Weights   04/10/23 1226 04/14/23 1055  Weight: 74.8 kg 74.8 kg   Body mass index is 33.33 kg/m.  Gen: 78 y.o. female in no apparent distress.  Nontoxic Pulm: Non-labored breathing.  Clear to auscultation bilaterally.  CV: Regular rate and rhythm.   GI: Abdomen soft, non-tender, non-distended,   Ext: Warm, no deformities, no pedal edema.  Good pulses distal bilateral lower extremities.  Skin: wound vac over left hip surgical site Neuro: Alert and oriented. No focal neurological deficits.distal sensation intact Psych: Calm  Judgement and insight appear normal. Mood & affect appropriate.     I have personally reviewed the following labs and images: CBC: Recent Labs  Lab 04/10/23 1236 04/11/23 0433 04/12/23 0401 04/13/23 0827 04/14/23 0312 04/14/23 1613  WBC 9.7 5.9 9.9 6.6 6.4  --   NEUTROABS 7.7  --   --   --   --   --   HGB 10.7* 9.1* 8.5* 9.3* 8.9* 7.7*  HCT 32.7* 28.4* 26.7* 30.3* 28.6* 25.5*  MCV 88.9 89.9 90.8 92.9 93.2  --   PLT 246 182 168 169 156  --    BMP &GFR Recent  Labs  Lab 04/10/23 1236 04/11/23 0433 04/13/23 0827 04/14/23 0312  NA 139 138 139 138  K 3.7 4.1 3.8 3.7  CL 104 106 106 108  CO2 19* 23 26 24   GLUCOSE 134* 207* 94 129*  BUN 26* 25* 21 17  CREATININE 1.20* 1.10* 0.84 1.14*  CALCIUM  11.7* 10.3 10.4* 9.9   Estimated Creatinine Clearance: 36.4 mL/min (A) (by C-G formula based on SCr of 1.14 mg/dL (H)). Liver & Pancreas: Recent Labs  Lab 04/13/23 0827  AST 11*  ALT 10  ALKPHOS 73  BILITOT 0.5  PROT 5.5*  ALBUMIN  3.0*   No results for input(s): "LIPASE", "AMYLASE" in the last 168 hours. No results for input(s): "AMMONIA" in the last  168 hours. Diabetic: No results for input(s): "HGBA1C" in the last 72 hours. Recent Labs  Lab 04/13/23 1620 04/14/23 0810 04/14/23 1056 04/14/23 1500 04/14/23 1650  GLUCAP 167* 100* 82 156* 154*   Cardiac Enzymes: No results for input(s): "CKTOTAL", "CKMB", "CKMBINDEX", "TROPONINI" in the last 168 hours. No results for input(s): "PROBNP" in the last 8760 hours. Coagulation Profile: No results for input(s): "INR", "PROTIME" in the last 168 hours. Thyroid Function Tests: No results for input(s): "TSH", "T4TOTAL", "FREET4", "T3FREE", "THYROIDAB" in the last 72 hours. Lipid Profile: No results for input(s): "CHOL", "HDL", "LDLCALC", "TRIG", "CHOLHDL", "LDLDIRECT" in the last 72 hours. Anemia Panel: Recent Labs    04/13/23 0321  VITAMINB12 385  FOLATE 5.0*  FERRITIN 302  TIBC 234*  IRON 48  RETICCTPCT 2.0   Urine analysis:    Component Value Date/Time   COLORURINE STRAW (A) 10/21/2022 1603   APPEARANCEUR CLEAR 10/21/2022 1603   LABSPEC 1.008 10/21/2022 1603   PHURINE 7.0 10/21/2022 1603   GLUCOSEU NEGATIVE 10/21/2022 1603   HGBUR NEGATIVE 10/21/2022 1603   BILIRUBINUR NEGATIVE 10/21/2022 1603   KETONESUR NEGATIVE 10/21/2022 1603   PROTEINUR NEGATIVE 10/21/2022 1603   NITRITE NEGATIVE 10/21/2022 1603   LEUKOCYTESUR TRACE (A) 10/21/2022 1603   Sepsis Labs: Invalid input(s): "PROCALCITONIN", "LACTICIDVEN"  Microbiology: Recent Results (from the past 240 hours)  Culture, Fungus without Smear     Status: None (Preliminary result)   Collection Time: 04/12/23  1:03 PM   Specimen: PATH Cytology Misc. fluid; Synovial Fluid  Result Value Ref Range Status   Specimen Description SYNOVIAL  Final   Special Requests NONE  Final   Culture   Final    NO FUNGUS ISOLATED AFTER 2 DAYS Performed at [facility], [address], [place], [place] [address]    Report Status PENDING  Incomplete  Body fluid culture w Gram Stain     Status: None (Preliminary result)   Collection  Time: 04/12/23  1:03 PM   Specimen: PATH Cytology Misc. fluid; Synovial Fluid  Result Value Ref Range Status   Specimen Description SYNOVIAL  Final   Special Requests NONE  Final   Gram Stain NO WBC SEEN NO ORGANISMS SEEN   Final   Culture   Final    NO GROWTH 2 DAYS Performed at [facility], [address] [place], [place] [address]    Report Status PENDING  Incomplete  Surgical pcr screen     Status: None   Collection Time: 04/13/23  4:50 PM   Specimen: Nasal Mucosa; Nasal Swab  Result Value Ref Range Status   MRSA, PCR NEGATIVE NEGATIVE Final   Staphylococcus aureus NEGATIVE NEGATIVE Final    Comment: (NOTE) The Xpert SA Assay (FDA approved for NASAL  specimens in patients 58 years of age and older), is one component of a comprehensive surveillance program. It is not intended to diagnose infection nor to guide or monitor treatment. Performed at [facility], [address], [place], [place] [address]   Aerobic/Anaerobic Culture w Gram Stain (surgical/deep wound)     Status: None (Preliminary result)   Collection Time: 04/14/23 12:52 PM   Specimen: PATH Soft tissue  Result Value Ref Range Status   Specimen Description TISSUE  Final   Special Requests   Final    HIP Performed at [facility], [address], [place], [place] [address]    Gram Stain PENDING  Incomplete   Culture PENDING  Incomplete   Report Status PENDING  Incomplete  Aerobic/Anaerobic Culture w Gram Stain (surgical/deep wound)     Status: None (Preliminary result)   Collection Time: 04/14/23 12:53 PM   Specimen: PATH Soft tissue  Result Value Ref Range Status   Specimen Description   Final    TISSUE Performed at [facility], [address] [place], [place] [address]    Special Requests   Final    NONE Performed at [facility], [address] [place], [place] [address]    Gram Stain PENDING  Incomplete   Culture PENDING  Incomplete    Report Status PENDING  Incomplete  Aerobic/Anaerobic Culture w Gram Stain (surgical/deep wound)     Status: None (Preliminary result)   Collection Time: 04/14/23 12:53 PM   Specimen: PATH Soft tissue  Result Value Ref Range Status   Specimen Description   Final    TISSUE Performed at [facility], [address], [place], [place] [address]    Special Requests   Final    NONE Performed at [facility], [address] [place], [place] [address]    Gram Stain PENDING  Incomplete   Culture PENDING  Incomplete   Report Status PENDING  Incomplete    Radiology Studies: DG HIP UNILAT W OR W/O PELVIS 2-3 VIEWS LEFT Result Date: 04/14/2023 CLINICAL DATA:  Postop left hip arthroplasty revision. EXAM: DG HIP (WITH OR WITHOUT PELVIS) 2-3V LEFT COMPARISON:  Preoperative imaging. FINDINGS: New left hip arthroplasty in expected alignment. No periprosthetic lucency or fracture. Recent postsurgical change includes air and edema in the soft tissues. Stable appearance of right hip arthroplasty. Lateral wound VAC in place. IMPRESSION: Revision left hip arthroplasty without immediate postoperative complication. Electronically Signed   By: Chadwick Colonel M.D.   On: 04/14/2023 16:28   DG Pelvis Portable Result Date: 04/14/2023 CLINICAL DATA:  Intra op left hip revision EXAM: PORTABLE PELVIS 1-2 VIEWS COMPARISON:  04/10/2023 FINDINGS: Cross-table intraoperative view of the pelvis obtained in the operating room. Portions of a left hip arthroplasty are visualized. Prior right hip arthroplasty in place. IMPRESSION: Intraoperative view during left hip arthroplasty revision. Electronically Signed   By: Chadwick Colonel M.D.   On: 04/14/2023 16:27    Scheduled Meds:  allopurinol   300 mg Oral Daily   amLODipine   10 mg Oral Daily   [START ON 04/15/2023] aspirin  EC  81 mg Oral BID   [START ON 04/17/2023] cefadroxil   500 mg Oral BID   colchicine   0.6 mg Oral Daily   docusate sodium   100 mg  Oral BID   [START ON 04/15/2023] furosemide   20 mg Oral Daily   insulin  aspart  0-15 Units Subcutaneous TID WC   insulin  aspart  0-5 Units  Subcutaneous QHS   insulin  aspart  4 Units Subcutaneous TID WC   pantoprazole   40 mg Oral Daily   senna  1 tablet Oral BID   Continuous Infusions:   ceFAZolin  (ANCEF ) IV       LOS: 3 days   Raymonde Calico, MD [facility] [email or web address] 04/14/2023, 5:16 PM

## 2023-04-14 NOTE — Progress Notes (Signed)
     Subjective:  Patient reports pain as mild today.  Eager for surgery.  Plan for OR today.  Denies distal numbness and tingling.  Pain with left hip movement but otherwise no pain at rest.  Denies pain of joints or extremities.  Aspiration was performed on Saturday minimal fluid was obtained this was sent for aerobic anaerobic culture.  Has had no growth to date.  Objective:   VITALS:   Vitals:   04/13/23 0949 04/13/23 1324 04/13/23 2001 04/14/23 0417  BP: 130/72 124/62 139/65 (!) 154/74  Pulse: 60 (!) 59 62 (!) 57  Resp: 16 16 18 18   Temp: 98 F (36.7 C) 97.6 F (36.4 C) 98.9 F (37.2 C) 98.2 F (36.8 C)  TempSrc: Oral Oral Oral Oral  SpO2: 98% 100% 99% 99%  Weight:      Height:        Sensation intact distally Intact pulses distally Dorsiflexion/Plantar flexion intact Hip range of motion deferred given known dislocation  Lab Results  Component Value Date   WBC 6.4 04/14/2023   HGB 8.9 (L) 04/14/2023   HCT 28.6 (L) 04/14/2023   MCV 93.2 04/14/2023   PLT 156 04/14/2023   BMET    Component Value Date/Time   NA 138 04/14/2023 0312   K 3.7 04/14/2023 0312   CL 108 04/14/2023 0312   CO2 24 04/14/2023 0312   GLUCOSE 129 (H) 04/14/2023 0312   BUN 17 04/14/2023 0312   CREATININE 1.14 (H) 04/14/2023 0312   CALCIUM  9.9 04/14/2023 0312   GFRNONAA 50 (L) 04/14/2023 0312   Assessment/Plan: * Day of Surgery *   Principal Problem:   Closed dislocation of left hip, initial encounter (HCC) Active Problems:   Essential hypertension   Gout   HLD (hyperlipidemia)   Primary hyperparathyroidism (HCC)   T2DM (type 2 diabetes mellitus) (HCC)  Left hip instability plan for OR today for revision of her total hip arthroplasty.  Risk and benefits were discussed with the patient.  Ultimately would benefit from a cup cup revision.  Preoperative workup including aspiration has been negative for infection.  The risks benefits and alternatives were discussed with the patient  including but not limited to the risks of nonoperative treatment, versus surgical intervention including infection, bleeding, nerve injury, periprosthetic fracture, the need for revision surgery, dislocation, leg length discrepancy, blood clots, cardiopulmonary complications, morbidity, mortality, among others, and they were willing to proceed.     Starling Jessie A Shawntee Mainwaring 04/14/2023, 10:26 AM   Priscille Brought, MD  Contact information:   [phone] 7am-5pm epic message Dr. Pryor Browning, or call office for patient follow up: [phone] After hours and holidays please check [email or web address] for group call information for [facility]

## 2023-04-14 NOTE — Discharge Instructions (Signed)
 INSTRUCTIONS AFTER JOINT REPLACEMENT   Remove items at home which could result in a fall. This includes throw rugs or furniture in walking pathways ICE to the affected joint every three hours while awake for 30 minutes at a time, for at least the first 3-5 days, and then as needed for pain and swelling.  Continue to use ice for pain and swelling. You may notice swelling that will progress down to the foot and ankle.  This is normal after surgery.  Elevate your leg when you are not up walking on it.   Continue to use the breathing machine you got in the hospital (incentive spirometer) which will help keep your temperature down.  It is common for your temperature to cycle up and down following surgery, especially at night when you are not up moving around and exerting yourself.  The breathing machine keeps your lungs expanded and your temperature down.  DIET:  As you were doing prior to hospitalization, we recommend a well-balanced diet.  DRESSING / WOUND CARE / SHOWERING:  Keep the surgical dressing until follow up.  The dressing is water proof, so you can shower without any extra covering.  IF THE DRESSING FALLS OFF or the wound gets wet inside, change the dressing with sterile gauze.  Please use good hand washing techniques before changing the dressing.  Do not use any lotions or creams on the incision until instructed by your surgeon.    ACTIVITY  Increase activity slowly as tolerated, but follow the weight bearing instructions below.   No driving for 6 weeks or until further direction given by your physician.  You cannot drive while taking narcotics.  No lifting or carrying greater than 10 lbs. until further directed by your surgeon. Avoid periods of inactivity such as sitting longer than an hour when not asleep. This helps prevent blood clots.  You may return to work once you are authorized by your doctor.   WEIGHT BEARING: Weight bearing as tolerated with assist device (walker, cane, etc) as  directed, use it as long as suggested by your surgeon or therapist, typically at least 4-6 weeks.  EXERCISES  Results after joint replacement surgery are often greatly improved when you follow the exercise, range of motion and muscle strengthening exercises prescribed by your doctor. Safety measures are also important to protect the joint from further injury. Any time any of these exercises cause you to have increased pain or swelling, decrease what you are doing until you are comfortable again and then slowly increase them. If you have problems or questions, call your caregiver or physical therapist for advice.   Rehabilitation is important following a joint replacement. After just a few days of immobilization, the muscles of the leg can become weakened and shrink (atrophy).  These exercises are designed to build up the tone and strength of the thigh and leg muscles and to improve motion. Often times heat used for twenty to thirty minutes before working out will loosen up your tissues and help with improving the range of motion but do not use heat for the first two weeks following surgery (sometimes heat can increase post-operative swelling).   These exercises can be done on a training (exercise) mat, on the floor, on a table or on a bed. Use whatever works the best and is most comfortable for you.    Use music or television while you are exercising so that the exercises are a pleasant break in your day. This will make your life  better with the exercises acting as a break in your routine that you can look forward to.   Perform all exercises about fifteen times, three times per day or as directed.  You should exercise both the operative leg and the other leg as well.  Exercises include:   Quad Sets - Tighten up the muscle on the front of the thigh (Quad) and hold for 5-10 seconds.   Straight Leg Raises - With your knee straight (if you were given a brace, keep it on), lift the leg to 60 degrees, hold  for 3 seconds, and slowly lower the leg.  Perform this exercise against resistance later as your leg gets stronger.  Leg Slides: Lying on your back, slowly slide your foot toward your buttocks, bending your knee up off the floor (only go as far as is comfortable). Then slowly slide your foot back down until your leg is flat on the floor again.  Angel Wings: Lying on your back spread your legs to the side as far apart as you can without causing discomfort.  Hamstring Strength:  Lying on your back, push your heel against the floor with your leg straight by tightening up the muscles of your buttocks.  Repeat, but this time bend your knee to a comfortable angle, and push your heel against the floor.  You may put a pillow under the heel to make it more comfortable if necessary.   A rehabilitation program following joint replacement surgery can speed recovery and prevent re-injury in the future due to weakened muscles. Contact your doctor or a physical therapist for more information on knee rehabilitation.   CONSTIPATION:  Constipation is defined medically as fewer than three stools per week and severe constipation as less than one stool per week.  Even if you have a regular bowel pattern at home, your normal regimen is likely to be disrupted due to multiple reasons following surgery.  Combination of anesthesia, postoperative narcotics, change in appetite and fluid intake all can affect your bowels.   YOU MUST use at least one of the following options; they are listed in order of increasing strength to get the job done.  They are all available over the counter, and you may need to use some, POSSIBLY even all of these options:    Drink plenty of fluids (prune juice may be helpful) and high fiber foods Colace 100 mg by mouth twice a day  Senokot for constipation as directed and as needed Dulcolax (bisacodyl), take with full glass of water  Miralax (polyethylene glycol) once or twice a day as needed.  If you  have tried all these things and are unable to have a bowel movement in the first 3-4 days after surgery call either your surgeon or your primary doctor.    If you experience loose stools or diarrhea, hold the medications until you stool forms back up.  If your symptoms do not get better within 1 week or if they get worse, check with your doctor.  If you experience "the worst abdominal pain ever" or develop nausea or vomiting, please contact the office immediately for further recommendations for treatment.  ITCHING:  If you experience itching with your medications, try taking only a single pain pill, or even half a pain pill at a time.  You can also use Benadryl over the counter for itching or also to help with sleep.   TED HOSE STOCKINGS:  Use stockings on both legs until for at least 2 weeks or  as directed by physician office. They may be removed at night for sleeping.  MEDICATIONS:  See your medication summary on the "After Visit Summary" that nursing will review with you.  You may have some home medications which will be placed on hold until you complete the course of blood thinner medication.  It is important for you to complete the blood thinner medication as prescribed.  Blood clot prevention (DVT Prophylaxis): After surgery you are at an increased risk for a blood clot. you were prescribed a blood thinner, Aspirin 81mg , to be taken twice daily for a total of 4 weeks from surgery to help reduce your risk of getting a blood clot.  Signs of a pulmonary embolus (blood clot in the lungs) include sudden short of breath, feeling lightheaded or dizzy, chest pain with a deep breath, rapid pulse rapid breathing.  Signs of a blood clot in your arms or legs include new unexplained swelling and cramping, warm, red or darkened skin around the painful area.  Please call the office or 911 right away if these signs or symptoms develop.  PRECAUTIONS:   If you experience chest pain or shortness of breath - call 911  immediately for transfer to the hospital emergency department.   If you develop a fever greater that 101 F, purulent drainage from wound, increased redness or drainage from wound, foul odor from the wound/dressing, or calf pain - CONTACT YOUR SURGEON.                                                   FOLLOW-UP APPOINTMENTS:  If you do not already have a post-op appointment, please call the office for an appointment to be seen by your surgeon.  Guidelines for how soon to be seen are listed in your "After Visit Summary", but are typically between 2-3 weeks after surgery.  If you have a specialized bandage, you may be told to follow up 1 week after surgery.  POST-OPERATIVE OPIOID TAPER INSTRUCTIONS: It is important to wean off of your opioid medication as soon as possible. If you do not need pain medication after your surgery it is ok to stop day one. Opioids include: Codeine, Hydrocodone(Norco, Vicodin), Oxycodone(Percocet, oxycontin) and hydromorphone amongst others.  Long term and even short term use of opiods can cause: Increased pain response Dependence Constipation Depression Respiratory depression And more.  Withdrawal symptoms can include Flu like symptoms Nausea, vomiting And more Techniques to manage these symptoms Hydrate well Eat regular healthy meals Stay active Use relaxation techniques(deep breathing, meditating, yoga) Do Not substitute Alcohol to help with tapering If you have been on opioids for less than two weeks and do not have pain than it is ok to stop all together.  Plan to wean off of opioids This plan should start within one week post op of your joint replacement. Maintain the same interval or time between taking each dose and first decrease the dose.  Cut the total daily intake of opioids by one tablet each day Next start to increase the time between doses. The last dose that should be eliminated is the evening dose.   MAKE SURE YOU:  Understand these  instructions.  Get help right away if you are not doing well or get worse.    Thank you for letting us be a part of your medical care team.  It is a privilege we respect greatly.  We hope these instructions will help you stay on track for a fast and full recovery!

## 2023-04-14 NOTE — Anesthesia Preprocedure Evaluation (Signed)
 Anesthesia Evaluation  Patient identified by MRN, date of birth, ID band Patient awake    Reviewed: Allergy & Precautions, H&P , NPO status , Patient's Chart, lab work & pertinent test results  Airway Mallampati: II   Neck ROM: full    Dental   Pulmonary neg pulmonary ROS   breath sounds clear to auscultation       Cardiovascular hypertension,  Rhythm:regular Rate:Normal     Neuro/Psych  Neuromuscular disease    GI/Hepatic ,GERD  ,,  Endo/Other  diabetes, Type 2    Renal/GU      Musculoskeletal  (+) Arthritis ,    Abdominal   Peds  Hematology   Anesthesia Other Findings   Reproductive/Obstetrics                             Anesthesia Physical Anesthesia Plan  ASA: 2  Anesthesia Plan: MAC and Spinal   Post-op Pain Management:    Induction: Intravenous  PONV Risk Score and Plan: 2 and Propofol  infusion and Treatment may vary due to age or medical condition  Airway Management Planned: Simple Face Mask  Additional Equipment:   Intra-op Plan:   Post-operative Plan:   Informed Consent: I have reviewed the patients History and Physical, chart, labs and discussed the procedure including the risks, benefits and alternatives for the proposed anesthesia with the patient or authorized representative who has indicated his/her understanding and acceptance.     Dental advisory given  Plan Discussed with: CRNA, Anesthesiologist and Surgeon  Anesthesia Plan Comments:        Anesthesia Quick Evaluation

## 2023-04-14 NOTE — Progress Notes (Signed)
 PT Note  Patient Details Name: Cynthia Torres MRN: [id number] DOB: October 07, 1945   Cancelled Treatment:    Reason Eval/Treat Not Completed: Pain limiting ability to participate pt reporting 7-8/10 at rest. Pt had visitor in room and eating dinner. PT made nurse aware of pt pain report. PT to return as schedule allows and to continue to follow pt acutely.   Cary Clarks, PT Acute Rehab   Annalee Kiang 04/14/2023, 6:00 PM

## 2023-04-14 NOTE — Anesthesia Procedure Notes (Signed)
 Procedure Name: Intubation Date/Time: 04/14/2023 12:10 PM  Performed by: Merna Aase, CRNAPre-anesthesia Checklist: Patient identified, Patient being monitored, Timeout performed, Emergency Drugs available and Suction available Patient Re-evaluated:Patient Re-evaluated prior to induction Oxygen Delivery Method: Circle system utilized Preoxygenation: Pre-oxygenation with 100% oxygen Induction Type: IV induction Ventilation: Mask ventilation without difficulty Laryngoscope Size: Mac and 4 Grade View: Grade I Tube type: Oral Tube size: 7.0 mm Number of attempts: 1 Airway Equipment and Method: Stylet Placement Confirmation: ETT inserted through vocal cords under direct vision, positive ETCO2 and breath sounds checked- equal and bilateral Secured at: 22 cm Tube secured with: Tape Dental Injury: Teeth and Oropharynx as per pre-operative assessment

## 2023-04-14 NOTE — Op Note (Addendum)
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  application of incisional wound VAC  PREOPERATIVE INDICATIONS:   Cynthia Torres is an 78 y.o. female who had a hip replacement done by Dr. Richardson Landry approximately 20 years ago.  She has been having some pain over the past year.  More recently she had spine surgery about 2 months ago and had a fall 1 week ago when she was seen in the office she had significant pain in the left hip difficulty weightbearing x-rays showed a dislocated left hip replacement.  She underwent closed reduction in the operating room last Thursday but unfortunately the hip dislocated in the recovery room.  Given concern for instability as well as possible loosening of the acetabular component which appeared to be in a suboptimal position elected for revision of her total hip replacement.  Hip aspiration performed over the weekend has showed no growth to date reassuring against prosthetic joint infection the risks benefits and alternatives were discussed with the patient including but not limited to the risks of nonoperative treatment, versus surgical intervention including infection, bleeding, nerve injury, periprosthetic fracture, the need for revision surgery, dislocation, leg length discrepancy, blood clots, cardiopulmonary complications, morbidity, mortality, among others, and they were willing to proceed.     OPERATIVE REPORT     SURGEON:  Weber Cooks, MD    ASSISTANT: Darron Doom, RNFA, (Present throughout the entire procedure,  necessary for completion of procedure in a timely manner, assisting with retraction, instrumentation, and closure)     ANESTHESIA: General  ESTIMATED BLOOD  LOSS: 700cc    COMPLICATIONS:  None.     COMPONENTS:   [employer] Trident 252 mm multihole acetabular shell, 6.5 hex screws x 2, 28 x 48 ADM/MDM dual mobility head ball, 42 mm MDM liner, 28 mm ceramic head ball with +5 titanium adapter sleeve. Implant Name Type Inv. Item Serial No. Manufacturer Lot No. LRB No. Used Action  SCREW HEX LP 6.5X35 - [id number] Screw SCREW HEX LP 6.5X35  [employer] [id number] Left 1 Implanted  SHELL MULTIHOLE ACETABULAR 52E - [id number] Miscellaneous SHELL MULTIHOLE ACETABULAR 52E  [employer] [id number] Left 1 Implanted  SCREW HEX LP 6.5X30 - [id number] Screw SCREW HEX LP 6.5X30  [employer] [id number] Left 1 Implanted  SCREW HEX LP 6.5X25 - [id number] Screw SCREW HEX LP 6.5X25  [employer] [id number] Left 1 Implanted  LINER ADM MDM INS 28/48 42E - [id number] Liner LINER ADM MDM INS 28/48 42E  [employer] [id number] Left 1 Implanted  LINER E - [id number] Orthopedic Implant LINER E  [employer] [id number] Left 1 Implanted  HEAD FEM BIOLOX DELTA UN 28 - [id number] Orthopedic Implant HEAD FEM BIOLOX DELTA UN 28  [employer] [id number] Left 1 Implanted  ADAPTER SLEEVE UNV CTAPER PL5 - [id number] Orthopedic Implant ADAPTER SLEEVE UNV CTAPER PL5  [employer] [id number] Left 1 Implanted    The aquamantis was utilized for this case to help facilitate better hemostasis as patient was felt to be at increased risk of bleeding because of preop anemia and complex case requiring increased OR time and/or exposure.       PROCEDURE IN DETAIL:   The patient was met in the holding area and  identified.  The appropriate hip  was identified and marked at the operative site.  The patient was then transported to the OR  and  placed under anesthesia.  At that point, the patient was  placed in the lateral decubitus position with the operative side up and  secured to the operating room table  and all bony prominences padded. A subaxillary role was  also placed.    The operative lower extremity was prepped from the iliac crest to the distal leg.  Sterile draping was performed.  Preoperative antibiotics, 2 gm of ancef,1 gm of Tranexamic Acid, and 8 mg of Decadron administered. Time out was performed prior to incision.      A routine posterolateral approach was utilized via sharp dissection carried down to the subcutaneous tissue utilizing the patient's old incision.  Gross bleeders were Bovie coagulated.  The iliotibial band was identified and incised along the length of the skin incision through the glute max fascia.  Charnley retractor was placed with care to protect the sciatic nerve posteriorly.  With the hip internally rotated, A capsulotomy was then performed off the femoral insertion and also tagged with a #5 Ethibond.  No purulence was appreciated.  However 3 routine synovial specimens were sent for an aerobic anaerobic culture.  The hip was found to be in the socket at this point.  Notably the acetabular component was very anteverted and vertical.  With extension and external rotation there was subluxation of the head ball anteriorly.  The hip was very stable posteriorly. The hip was then carefully dislocated posteriorly.  The metal head ball was removed with the tamp.  There was nowhere dump damage to the trunnion.     I then exposed the deep acetabulum.  After adequate visualization, the acetabular liner was removed.  Notably the acetabular component was grossly loose in the socket.  The 2 screws were then removed and with only minimal adhesions to the acetabular component could be easily extracted.  We then cleaned out the acetabular floor.  Notably there was significant anterior bone loss.  I then started reaming with a 48 mm reamer, first medializing to the floor of the cotyloid fossa, and then in the position of the cup aiming towards the greater sciatic notch, matching the version of the transverse acetabular ligament and tucked under  the anterior wall. I reamed up to 52 mm reamer with good bony bed preparation and a 52 mm cup was chosen.  The real cup was then impacted into place.  Appropriate version and inclination was confirmed clinically matching their bony anatomy, and also with the use of the jig.  I placed 2 screws in the posterior superior quadrant to augment fixation.  A trial liner was first inserted with a 36+5 head ball. I reduced the hip and it was found to have excellent stability.  There was no impingement with full extension and 90 degrees external rotation.  The hip was stable at the position of sleep and with 90 degrees flexion and 90 degrees of internal rotation.  Leg lengths were also clinically assessed in the lateral position and felt to be equal. Intra-Op flatplate was obtained and confirmed appropriate component positions.    Given the patient's recent spinal fusion instability elected to use dual mobility to help improve the stability of the hip.  The acetabular trial liner and head ball were removed and acetabulum was exposed again.  The dual mobility metal liner was impacted into the acetabular shell.  The dual mobility head ball was assembled on  the back table with a +5 titanium sleeve.  This was impacted onto the dry clean trunnion.  The hip was then again assessed and found to have excellent stability.  The posterior capsule was then closed with #5 Ethibond.    I then irrigated the hip copiously with dilute Betadine and with normal saline pulse lavage. Periarticular injection was then performed with Exparel.   We repaired the fascia #1 barbed suture, followed by 0 barbed suture for the subcutaneous fat.  Skin was closed with 2-0 Vicryl and 3-0 nylon.  Incisional Prevena wound VAC was applied. The patient was then awakened and returned to PACU in stable and satisfactory condition.  Leg lengths in the supine position were assessed and felt to be clinically equal. There were no complications.  Post op  recs: WB: 50% PWB LLE, posterior hip precautions x 6 weeks Abx: ancef Imaging: PACU pelvis Xray Dressing: Aquacell, keep intact until follow up DVT prophylaxis: Aspirin 81BID starting POD1 Follow up: 2 weeks after surgery for a wound check with Dr. Blanchie Dessert at [facility].  Address: [address], [place], [place] [address]  Office Phone: [phone]   Weber Cooks, MD Orthopedic Surgeon

## 2023-04-14 NOTE — Transfer of Care (Signed)
 Immediate Anesthesia Transfer of Care Note  Patient: Cynthia Torres  Procedure(s) Performed: TOTAL HIP REVISION (Left: Hip)  Patient Location: PACU  Anesthesia Type:General  Level of Consciousness: awake  Airway & Oxygen Therapy: Patient Spontanous Breathing and Patient connected to nasal cannula oxygen  Post-op Assessment: Report given to RN and Post -op Vital signs reviewed and stable  Post vital signs: Reviewed and stable  Last Vitals:  Vitals Value Taken Time  BP 171/82 04/14/23 1500  Temp    Pulse 70 04/14/23 1503  Resp 20 04/14/23 1503  SpO2 100 % 04/14/23 1503  Vitals shown include unfiled device data.  Last Pain:  Vitals:   04/14/23 1047  TempSrc: Oral  PainSc:       Patients Stated Pain Goal: 3 (04/14/23 0218)  Complications: No notable events documented.

## 2023-04-15 ENCOUNTER — Encounter (HOSPITAL_COMMUNITY): Payer: Self-pay | Admitting: Orthopedic Surgery

## 2023-04-15 DIAGNOSIS — S73005A Unspecified dislocation of left hip, initial encounter: Secondary | ICD-10-CM | POA: Diagnosis not present

## 2023-04-15 LAB — CBC
HCT: 20.8 % — ABNORMAL LOW (ref 36.0–46.0)
Hemoglobin: 6.4 g/dL — CL (ref 12.0–15.0)
MCH: 29.1 pg (ref 26.0–34.0)
MCHC: 30.8 g/dL (ref 30.0–36.0)
MCV: 94.5 fL (ref 80.0–100.0)
Platelets: 146 10*3/uL — ABNORMAL LOW (ref 150–400)
RBC: 2.2 MIL/uL — ABNORMAL LOW (ref 3.87–5.11)
RDW: 13 % (ref 11.5–15.5)
WBC: 11.9 10*3/uL — ABNORMAL HIGH (ref 4.0–10.5)
nRBC: 0 % (ref 0.0–0.2)

## 2023-04-15 LAB — BODY FLUID CULTURE W GRAM STAIN: Gram Stain: NONE SEEN

## 2023-04-15 LAB — BASIC METABOLIC PANEL
Anion gap: 9 (ref 5–15)
BUN: 22 mg/dL (ref 8–23)
CO2: 22 mmol/L (ref 22–32)
Calcium: 9.9 mg/dL (ref 8.9–10.3)
Chloride: 103 mmol/L (ref 98–111)
Creatinine, Ser: 0.92 mg/dL (ref 0.44–1.00)
GFR, Estimated: >60 mL/min (ref >60)
Glucose, Bld: 196 mg/dL — ABNORMAL HIGH (ref 70–99)
Potassium: 4.6 mmol/L (ref 3.5–5.1)
Sodium: 134 mmol/L — ABNORMAL LOW (ref 135–145)

## 2023-04-15 LAB — PREPARE RBC (CROSSMATCH)

## 2023-04-15 LAB — GLUCOSE, CAPILLARY
Glucose-Capillary: 155 mg/dL — ABNORMAL HIGH (ref 70–99)
Glucose-Capillary: 225 mg/dL — ABNORMAL HIGH (ref 70–99)
Glucose-Capillary: 144 mg/dL — ABNORMAL HIGH (ref 70–99)
Glucose-Capillary: 90 mg/dL (ref 70–99)

## 2023-04-15 LAB — HEMOGLOBIN AND HEMATOCRIT, BLOOD
HCT: 24.8 % — ABNORMAL LOW (ref 36.0–46.0)
Hemoglobin: 7.8 g/dL — ABNORMAL LOW (ref 12.0–15.0)

## 2023-04-15 LAB — ABO/RH: ABO/RH(D): B POS

## 2023-04-15 MED ORDER — FOLIC ACID 1 MG PO TABS
1.0000 mg | ORAL_TABLET | Freq: Every day | ORAL | Status: DC
  Administered 2023-04-15 – 2023-04-18 (×4): 1 mg via ORAL
  Filled 2023-04-15 (×4): qty 1

## 2023-04-15 MED ORDER — SODIUM CHLORIDE 0.9% IV SOLUTION: Freq: Once | INTRAVENOUS | Status: AC

## 2023-04-15 NOTE — Progress Notes (Signed)
OT Cancellation Note  Patient Details Name: NARDOS PUTNAM MRN: [id number] DOB: [birth year]   Cancelled Treatment:    Reason Eval/Treat Not Completed: Other (comment) (Per nursing soft bp, high pain levels and waiting for blood transfusion.) Will follow up.  Presley Raddle OTR/L  Acute Rehab Services  [phone] office number   Alphia Moh 04/15/2023, 8:43 AM

## 2023-04-15 NOTE — Progress Notes (Addendum)
PROGRESS NOTE    Cynthia Torres  ZOX:[id number] DOB: 09-18-1945 DOA: 04/10/2023 PCP: Thana Ates, MD    Brief Narrative:   Cynthia Torres is a 78 y.o. female with past medical history significant for HTN, HLD, DM2, gout who presented to [facility] ED on 04/10/2023 via POV from Cynthia Torres [facility] orthopedic clinic for concern of hip dislocation.  Apparently patient fell last week with decreased mobility, pain to left hip (history of prior replacement).  X-ray outpatient showing dislocation at urgent care and sent to orthopedic clinic.  After review of the imaging, staff sent patient to the ED for further evaluation management.  In the ED, temperature 98.0 F, HR 63, RR 22, BP 152/75, SpO2 98% on room air. WBC 9.7, hemoglobin 10.7, platelet count 246.  Sodium 139, potassium 3.7, chloride 104, CO2 19, glucose 134, BUN 26, Cram 1.20.  Chest x-ray with no active cardiopulmonary disease process.  Left hip/pelvis x-ray with superior subluxation of the femoral component of the left hip arthroplasty. Orthopedics was consulted, attempted left hip close reduction was unsuccessful.  And patient underwent unsuccessful left hip closed reduction; with recommendation of orthopedics for admission for operative management.  TRH was consulted for admission.     Assessment & Plan:   Left hip prosthetic dislocation Patient initially presenting to urgent care with x-ray findings of dislocation of left hip and was separately sent to her orthopedist office for review.  After review of imaging, staff sent patient to ED for further evaluation management.  Left hip/pelvis x-ray with superior subluxation of the femoral component of the left hip arthroplasty.  Attempted closed reduction was unsuccessful in the ED.  Orthopedics was consulted and patient underwent closed reduction of left hip in the OR on 04/10/2023.  Given the concern for instability and possible loosening of the acetabular component, patient was taken  back to the operating room by orthopedics underwent left hip revision total arthroplasty by Dr. Blanchie Torres on 04/14/2023.  -- Orthopedics following, appreciate assistance -- 50% PWB LLE, posterior hip precautions x 6 weeks -- ASA 81 mg PO BID for postoperative DVT prophylaxis -- Cefazolin 2 g IV q8h x 3 days perioperatively followed by cefadroxil 500 mg PO BID x 7 days per orthopedics -- Oxycodone 5-10 mg p.o. q4h PRN moderate pain -- Dilaudid 0.5-1 mg IV q4h PRN severe pain -- Robaxin 5 mg p.o. q6h PRN muscle spasms -- PT/OT evaluation: Pending, TOC consulted for anticipated need of SNF placement -- Outpatient follow-up with orthopedics 2 weeks for wound check  Acute postoperative blood loss anemia -- Hgb 10.7>>9.3>8.9>7.7>6.4 -- transfuse 1u pRBC today -- Repeat H&H 2 hours following transfusion -- Repeat CBC in a.m.  Folate deficiency Anemia panel with iron 48, TIBC low at 234, ferritin 302, folate low at 5.0, vitamin B12 385. -- Start folic acid 1 mg p.o. daily  Essential hypertension -- Hold home amlodipine, furosemide and lisinopril-HCTZ for now -- Closely monitor BP  Hyperlipidemia -- Crestor 5 mg p.o. daily  Type 2 diabetes mellitus On metformin 1000 mg p.o. twice daily at home. -- Hold metformin while inpatient -- Moderate SSI for coverage -- CBG before every meal/at bedtime  GERD -- Protonix 40 mg p.o. daily  Gout -- Colchicine 0.6 mg p.o. daily -- Allopurinol 3 m p.o. daily  DVT prophylaxis: SCDs Start: 04/10/23 2008    Code Status: Full Code Family Communication: No family present at bedside this morning  Disposition Plan:  Level of care: Med-Surg Status is:  Inpatient Remains inpatient appropriate because: Blood transfusion, pending PT/OT evaluation, anticipate need for rest placement.    Consultants:  Orthopedics  Procedures:  Unsuccessful closed reduction left hip dislocation orthopedics, 2/6 Successful closed reduction in OR, orthopedics  2/6 Revision left total hip arthroplasty, Dr. Blanchie Torres, 2/10  Antimicrobials:  Cefazolin 2/10>>  Subjective: Patient seen examined bedside, lying in bed; resting comfortably.  Nursing staff present, starting blood transfusion.  Patient complains of some soreness to her left hip otherwise no complaints this morning.  Awaiting therapy evaluation with anticipated need of SNF placement.  Patient with no other specific complaints, questions, or concerns at this time.  Denies headache, no dizziness, no chest pain, no palpitations, no shortness of breath, no abdominal pain, no fever/chills/night sweats, no nausea/vomiting/diarrhea, no focal weakness, no fatigue, no paresthesias.  No acute events overnight per nursing staff.  Objective: Vitals:   04/15/23 0922 04/15/23 1024 04/15/23 1057 04/15/23 1320  BP: 105/60 (!) 99/53 (!) 118/53 (!) 118/48  Pulse: 81 81 80 86  Resp: 16 18 18 20   Temp: 98 F (36.7 C) (!) 97.5 F (36.4 C) 97.8 F (36.6 C) 97.9 F (36.6 C)  TempSrc: Oral Oral  Oral  SpO2: 96% 99% 100% 96%  Weight:      Height:        Intake/Output Summary (Last 24 hours) at 04/15/2023 1341 Last data filed at 04/15/2023 1045 Gross per 24 hour  Intake 2780 ml  Output 1600 ml  Net 1180 ml   Filed Weights   04/10/23 1226 04/14/23 1055  Weight: 74.8 kg 74.8 kg    Examination:  Physical Exam: GEN: NAD, alert and oriented x 3, elderly in appearance HEENT: NCAT, PERRL, EOMI, sclera clear, MMM PULM: CTAB w/o wheezes/crackles, normal respiratory effort, on 2 L and cannula with SpO2 100% at rest CV: RRR w/o M/G/R GI: abd soft, NTND, NABS, no R/G/M MSK: no peripheral edema, muscle strength globally intact 5/5 bilateral upper/lower extremities, noted left hip surgical incision site with dressing in place, clean/dry/intact NEURO: CN II-XII intact, no focal deficits, sensation to light touch intact PSYCH: normal mood/affect Integumentary: Left hip surgical incision site as above,  otherwise no other concerning rashes/lesions/wounds noted on exposed skin surfaces    Data Reviewed: I have personally reviewed following labs and imaging studies  CBC: Recent Labs  Lab 04/10/23 1236 04/11/23 0433 04/12/23 0401 04/13/23 0827 04/14/23 0312 04/14/23 1613 04/15/23 0306  WBC 9.7 5.9 9.9 6.6 6.4  --  11.9*  NEUTROABS 7.7  --   --   --   --   --   --   HGB 10.7* 9.1* 8.5* 9.3* 8.9* 7.7* 6.4*  HCT 32.7* 28.4* 26.7* 30.3* 28.6* 25.5* 20.8*  MCV 88.9 89.9 90.8 92.9 93.2  --  94.5  PLT 246 182 168 169 156  --  146*   Basic Metabolic Panel: Recent Labs  Lab 04/10/23 1236 04/11/23 0433 04/13/23 0827 04/14/23 0312 04/15/23 0306  NA 139 138 139 138 134*  K 3.7 4.1 3.8 3.7 4.6  CL 104 106 106 108 103  CO2 19* 23 26 24 22   GLUCOSE 134* 207* 94 129* 196*  BUN 26* 25* 21 17 22   CREATININE 1.20* 1.10* 0.84 1.14* 0.92  CALCIUM 11.7* 10.3 10.4* 9.9 9.9   GFR: Estimated Creatinine Clearance: 45.1 mL/min (by C-G formula based on SCr of 0.92 mg/dL). Liver Function Tests: Recent Labs  Lab 04/13/23 0827  AST 11*  ALT 10  ALKPHOS 73  BILITOT 0.5  PROT 5.5*  ALBUMIN 3.0*   No results for input(s): "LIPASE", "AMYLASE" in the last 168 hours. No results for input(s): "AMMONIA" in the last 168 hours. Coagulation Profile: No results for input(s): "INR", "PROTIME" in the last 168 hours. Cardiac Enzymes: No results for input(s): "CKTOTAL", "CKMB", "CKMBINDEX", "TROPONINI" in the last 168 hours. BNP (last 3 results) No results for input(s): "PROBNP" in the last 8760 hours. HbA1C: No results for input(s): "HGBA1C" in the last 72 hours. CBG: Recent Labs  Lab 04/14/23 1500 04/14/23 1650 04/14/23 2115 04/15/23 0733 04/15/23 1217  GLUCAP 156* 154* 226* 155* 225*   Lipid Profile: No results for input(s): "CHOL", "HDL", "LDLCALC", "TRIG", "CHOLHDL", "LDLDIRECT" in the last 72 hours. Thyroid Function Tests: No results for input(s): "TSH", "T4TOTAL", "FREET4",  "T3FREE", "THYROIDAB" in the last 72 hours. Anemia Panel: Recent Labs    04/13/23 0321  VITAMINB12 385  FOLATE 5.0*  FERRITIN 302  TIBC 234*  IRON 48  RETICCTPCT 2.0   Sepsis Labs: No results for input(s): "PROCALCITON", "LATICACIDVEN" in the last 168 hours.  Recent Results (from the past 240 hours)  Culture, Fungus without Smear     Status: None (Preliminary result)   Collection Time: 04/12/23  1:03 PM   Specimen: PATH Cytology Misc. fluid; Synovial Fluid  Result Value Ref Range Status   Specimen Description SYNOVIAL  Final   Special Requests NONE  Final   Culture   Final    NO FUNGUS ISOLATED AFTER 2 DAYS Performed at [facility], [address] [place], [place] [address]    Report Status PENDING  Incomplete  Body fluid culture w Gram Stain     Status: None   Collection Time: 04/12/23  1:03 PM   Specimen: PATH Cytology Misc. fluid; Synovial Fluid  Result Value Ref Range Status   Specimen Description SYNOVIAL  Final   Special Requests NONE  Final   Gram Stain NO WBC SEEN NO ORGANISMS SEEN   Final   Culture   Final    NO GROWTH 3 DAYS Performed at [facility], [address], [place], [place] [address]    Report Status 04/15/2023 FINAL  Final  Surgical pcr screen     Status: None   Collection Time: 04/13/23  4:50 PM   Specimen: Nasal Mucosa; Nasal Swab  Result Value Ref Range Status   MRSA, PCR NEGATIVE NEGATIVE Final   Staphylococcus aureus NEGATIVE NEGATIVE Final    Comment: (NOTE) The Xpert SA Assay (FDA approved for NASAL specimens in patients 30 years of age and older), is one component of a comprehensive surveillance program. It is not intended to diagnose infection nor to guide or monitor treatment. Performed at [facility], [address] [place], [place] [address]   Aerobic/Anaerobic Culture w Gram Stain (surgical/deep wound)     Status: None (Preliminary result)   Collection Time: 04/14/23 12:52 PM   Specimen: PATH  Soft tissue  Result Value Ref Range Status   Specimen Description TISSUE  Final   Special Requests HIP  Final   Gram Stain NO WBC SEEN NO ORGANISMS SEEN   Final   Culture   Final    NO GROWTH < 24 HOURS Performed at [facility], [address], [place], [place] [address]    Report Status PENDING  Incomplete  Aerobic/Anaerobic Culture w Gram Stain (surgical/deep wound)     Status: None (Preliminary result)   Collection Time: 04/14/23 12:53 PM   Specimen:  PATH Soft tissue  Result Value Ref Range Status   Specimen Description   Final    TISSUE Performed at [facility], [address] [place], [place] [address]    Special Requests   Final    NONE Performed at [facility], [address], [place], [place] [address]    Gram Stain NO WBC SEEN NO ORGANISMS SEEN   Final   Culture   Final    NO GROWTH < 24 HOURS Performed at [facility], [address], [place], [place] [address]    Report Status PENDING  Incomplete  Aerobic/Anaerobic Culture w Gram Stain (surgical/deep wound)     Status: None (Preliminary result)   Collection Time: 04/14/23 12:53 PM   Specimen: PATH Soft tissue  Result Value Ref Range Status   Specimen Description   Final    TISSUE Performed at [facility], [address] [place], [place] [address]    Special Requests   Final    NONE Performed at [facility], [address] [place], [place] [address]    Gram Stain NO WBC SEEN NO ORGANISMS SEEN   Final   Culture   Final    NO GROWTH < 24 HOURS Performed at [facility], [address], [place], [place] [address]    Report Status PENDING  Incomplete         Radiology Studies: DG HIP UNILAT W OR W/O PELVIS 2-3 VIEWS LEFT Result Date: 04/14/2023 CLINICAL DATA:  Postop left hip arthroplasty revision. EXAM: DG HIP (WITH OR WITHOUT PELVIS) 2-3V LEFT COMPARISON:  Preoperative imaging. FINDINGS: New left hip arthroplasty in expected  alignment. No periprosthetic lucency or fracture. Recent postsurgical change includes air and edema in the soft tissues. Stable appearance of right hip arthroplasty. Lateral wound VAC in place. IMPRESSION: Revision left hip arthroplasty without immediate postoperative complication. Electronically Signed   By: Narda Rutherford M.D.   On: 04/14/2023 16:28   DG Pelvis Portable Result Date: 04/14/2023 CLINICAL DATA:  Intra op left hip revision EXAM: PORTABLE PELVIS 1-2 VIEWS COMPARISON:  04/10/2023 FINDINGS: Cross-table intraoperative view of the pelvis obtained in the operating room. Portions of a left hip arthroplasty are visualized. Prior right hip arthroplasty in place. IMPRESSION: Intraoperative view during left hip arthroplasty revision. Electronically Signed   By: Narda Rutherford M.D.   On: 04/14/2023 16:27        Scheduled Meds:  allopurinol  300 mg Oral Daily   amLODipine  10 mg Oral Daily   aspirin EC  81 mg Oral BID   [START ON 04/17/2023] cefadroxil  500 mg Oral BID   colchicine  0.6 mg Oral Daily   docusate sodium  100 mg Oral BID   furosemide  20 mg Oral Daily   insulin aspart  0-15 Units Subcutaneous TID WC   insulin aspart  0-5 Units Subcutaneous QHS   insulin aspart  4 Units Subcutaneous TID WC   pantoprazole  40 mg Oral Daily   polyethylene glycol  34 g Oral Daily   senna  1 tablet Oral BID   Continuous Infusions:   ceFAZolin (ANCEF) IV 2 g (04/15/23 0433)     LOS: 4 days    Time spent: 53 minutes spent on chart review, discussion with nursing staff, consultants, updating family and interview/physical exam; more than 50% of that time was spent in counseling and/or coordination of care.    Alvira Philips [place], DO  [facility] Available via Epic secure chat 7am-7pm After these hours, please refer to coverage provider listed on [email or web address] 04/15/2023, 1:41 PM

## 2023-04-15 NOTE — Progress Notes (Signed)
     Subjective:  Patient reports pain as mild.  Doing better now than before surgery. Hgb 6.4 plan for 1U transfusion today. Also plan for mobilization with PT today.  Objective:   VITALS:   Vitals:   04/15/23 0129 04/15/23 0429 04/15/23 0431 04/15/23 0729  BP: (!) 98/51 (!) 95/48 (!) 107/52 (!) 100/51  Pulse: 72 73 77 75  Resp: 17     Temp: 97.8 F (36.6 C) 97.9 F (36.6 C)    TempSrc: Oral Oral    SpO2: 98% 92%    Weight:      Height:        Neurovascular intact Sensation intact distally Intact pulses distally Dorsiflexion/Plantar flexion intact Incision: dressing C/D/I Compartment soft   Lab Results  Component Value Date   WBC 11.9 (H) 04/15/2023   HGB 6.4 (LL) 04/15/2023   HCT 20.8 (L) 04/15/2023   MCV 94.5 04/15/2023   PLT 146 (L) 04/15/2023   BMET    Component Value Date/Time   NA 134 (L) 04/15/2023 0306   K 4.6 04/15/2023 0306   CL 103 04/15/2023 0306   CO2 22 04/15/2023 0306   GLUCOSE 196 (H) 04/15/2023 0306   BUN 22 04/15/2023 0306   CREATININE 0.92 04/15/2023 0306   CALCIUM 9.9 04/15/2023 0306   GFRNONAA >60 04/15/2023 0306      Xray: THA components in good position no advere features  Assessment/Plan: 1 Day Post-Op   Principal Problem:   Closed dislocation of left hip, initial encounter (HCC) Active Problems:   Essential hypertension   Gout   HLD (hyperlipidemia)   Primary hyperparathyroidism (HCC)   T2DM (type 2 diabetes mellitus) (HCC)  S/p L THA revision 04/14/23  Post op recs: WB: 50% PWB LLE, posterior hip precautions x 6 weeks Abx: ancef Imaging: PACU pelvis Xray Dressing: Aquacell, keep intact until follow up DVT prophylaxis: Aspirin 81BID starting POD1 Follow up: 2 weeks after surgery for a wound check with Dr. Blanchie Dessert at [facility].  Address: [address], [place], [place] [address]  Office Phone: [phone]   Joen Laura 04/15/2023, 7:38 AM   Weber Cooks,  MD  Contact information:   [phone] 7am-5pm epic message Dr. Blanchie Dessert, or call office for patient follow up: [phone] After hours and holidays please check [email or web address] for group call information for [facility]

## 2023-04-15 NOTE — Evaluation (Addendum)
Physical Therapy Evaluation Patient Details Name: Cynthia Torres MRN: [id number] DOB: September 15, 1945 Today's Date: 04/15/2023  History of Present Illness  78 yo female presents to therapy s/p L THA revision on 04/14/2023 with aseptic loosening of the acetabular component. Pt sustained a fall 1 wk ago and underwent closed reduction of L hip on 2/6 which subsequently dislocated in recovery room. Pt is now s/p L THA revision to dual mobility construct on 2/10/25r hip precautions x 6 wks. Pt PMH includes but is not limited to: HTN, gout, HLD, hyperparathyroidism, lumbar laminectomy (02/2023), AKI, anemia, L THA, and DM II.  Clinical Impression  Pt admitted with above diagnosis.  Pt agreeable and motivated to work with PT; limited by pain and fatigue however able to amb ~ 6' with RW,  mod assist and chair follow for safety.  Patient will benefit from continued inpatient follow up therapy, <3 hours/day  Pt currently with functional limitations due to the deficits listed below (see PT Problem List). Pt will benefit from acute skilled PT to increase their independence and safety with mobility to allow discharge.             If plan is discharge home, recommend the following: A lot of help with walking and/or transfers;A lot of help with bathing/dressing/bathroom;Assistance with cooking/housework;Help with stairs or ramp for entrance;Assist for transportation   Can travel by private vehicle   No    Equipment Recommendations None recommended by PT  Recommendations for Other Services       Functional Status Assessment Patient has had a recent decline in their functional status and demonstrates the ability to make significant improvements in function in a reasonable and predictable amount of time.     Precautions / Restrictions   Posterior THP 50% PWB Incisional VAC     Mobility  Bed Mobility Overal bed mobility: Needs Assistance Bed Mobility: Supine to Sit     Supine to sit: Mod assist,  +2 for physical assistance     General bed mobility comments: bed pad used to assist lateral scoot, assist to progress LEs off bed, elevate trunk and maintain THP    Transfers Overall transfer level: Needs assistance Equipment used: Rolling walker (2 wheels) Transfers: Sit to/from Stand Sit to Stand: Mod assist, +2 safety/equipment           General transfer comment: cues for hand placement and RLE position    Ambulation/Gait Ambulation/Gait assistance: Mod assist, +2 safety/equipment Gait Distance (Feet): 6 Feet Assistive device: Rolling walker (2 wheels) Gait Pattern/deviations: Step-to pattern, Trunk flexed       General Gait Details: cues for sequence, PWB, trunk extension  Stairs            Wheelchair Mobility     Tilt Bed    Modified Rankin (Stroke Patients Only)       Balance Overall balance assessment: Needs assistance Sitting-balance support: Feet supported, No upper extremity supported Sitting balance-Leahy Scale: Fair     Standing balance support: Reliant on assistive device for balance, During functional activity Standing balance-Leahy Scale: Poor Standing balance comment: reliant on device and external assist                             Pertinent Vitals/Pain Pain Assessment Pain Assessment: Faces Faces Pain Scale: Hurts whole lot Pain Location: right hip Pain Descriptors / Indicators: Discomfort, Guarding, Grimacing Pain Intervention(s): Limited activity within patient's tolerance, Monitored during session, Premedicated before session,  Ice applied    Home Living Family/patient expects to be discharged to:: Skilled nursing facility Living Arrangements: Children                 Additional Comments: son works 8-8    Prior Function Prior Level of Function : Independent/Modified Independent;Working/employed;Driving             Mobility Comments: amb I'ly prior back surgery ~ 1 month ago       Extremity/Trunk  Assessment   Upper Extremity Assessment Upper Extremity Assessment: Generalized weakness    Lower Extremity Assessment Lower Extremity Assessment: RLE deficits/detail RLE Deficits / Details: ankle WFL, knee and hip grossly 2+/5, limited by post op pain       Communication        Cognition Arousal: Alert Behavior During Therapy: WFL for tasks assessed/performed   PT - Cognitive impairments: No apparent impairments                                 Cueing Cueing Techniques: Verbal cues, Tactile cues     General Comments      Exercises     Assessment/Plan    PT Assessment Patient needs continued PT services  PT Problem List Decreased strength;Decreased activity tolerance;Decreased balance;Decreased knowledge of use of DME;Decreased mobility;Pain;Decreased knowledge of precautions       PT Treatment Interventions DME instruction;Functional mobility training;Therapeutic activities;Patient/family education;Therapeutic exercise;Gait training    PT Goals (Current goals can be found in the Care Plan section)  Acute Rehab PT Goals Patient Stated Goal: rehab PT Goal Formulation: With patient/family Time For Goal Achievement: 04/29/23 Potential to Achieve Goals: Good    Frequency 7X/week     Co-evaluation               AM-PAC PT "6 Clicks" Mobility  Outcome Measure Help needed turning from your back to your side while in a flat bed without using bedrails?: A Lot Help needed moving from lying on your back to sitting on the side of a flat bed without using bedrails?: A Lot Help needed moving to and from a bed to a chair (including a wheelchair)?: A Lot Help needed standing up from a chair using your arms (e.g., wheelchair or bedside chair)?: A Lot Help needed to walk in hospital room?: A Lot Help needed climbing 3-5 steps with a railing? : Total 6 Click Score: 11    End of Session Equipment Utilized During Treatment: Gait belt Activity Tolerance:  Patient limited by fatigue;Patient limited by pain Patient left: in chair;with call bell/phone within reach Nurse Communication: Mobility status PT Visit Diagnosis: Other abnormalities of gait and mobility (R26.89)    Time: 4098-1191 PT Time Calculation (min) (ACUTE ONLY): 29 min   Charges:   PT Evaluation $PT Eval Low Complexity: 1 Low PT Treatments $Gait Training: 8-22 mins PT General Charges $$ ACUTE PT VISIT: 1 Visit         Delice Bison, PT  Acute Rehab Dept [facility]) [phone]  04/15/2023   [employer] 04/15/2023, 3:16 PM

## 2023-04-16 DIAGNOSIS — S73005A Unspecified dislocation of left hip, initial encounter: Secondary | ICD-10-CM | POA: Diagnosis not present

## 2023-04-16 LAB — HEMOGLOBIN AND HEMATOCRIT, BLOOD
HCT: 25 % — ABNORMAL LOW (ref 36.0–46.0)
Hemoglobin: 8 g/dL — ABNORMAL LOW (ref 12.0–15.0)

## 2023-04-16 LAB — CBC
HCT: 21.4 % — ABNORMAL LOW (ref 36.0–46.0)
Hemoglobin: 7 g/dL — ABNORMAL LOW (ref 12.0–15.0)
MCH: 29.8 pg (ref 26.0–34.0)
MCHC: 32.7 g/dL (ref 30.0–36.0)
MCV: 91.1 fL (ref 80.0–100.0)
Platelets: 134 10*3/uL — ABNORMAL LOW (ref 150–400)
RBC: 2.35 MIL/uL — ABNORMAL LOW (ref 3.87–5.11)
RDW: 13.2 % (ref 11.5–15.5)
WBC: 11.5 10*3/uL — ABNORMAL HIGH (ref 4.0–10.5)
nRBC: 0 % (ref 0.0–0.2)

## 2023-04-16 LAB — GLUCOSE, CAPILLARY
Glucose-Capillary: 112 mg/dL — ABNORMAL HIGH (ref 70–99)
Glucose-Capillary: 163 mg/dL — ABNORMAL HIGH (ref 70–99)
Glucose-Capillary: 170 mg/dL — ABNORMAL HIGH (ref 70–99)
Glucose-Capillary: 115 mg/dL — ABNORMAL HIGH (ref 70–99)

## 2023-04-16 LAB — BASIC METABOLIC PANEL WITH GFR
Anion gap: 8 (ref 5–15)
BUN: 19 mg/dL (ref 8–23)
CO2: 22 mmol/L (ref 22–32)
Calcium: 9.4 mg/dL (ref 8.9–10.3)
Chloride: 101 mmol/L (ref 98–111)
Creatinine, Ser: 0.84 mg/dL (ref 0.44–1.00)
GFR, Estimated: >60 mL/min
Glucose, Bld: 122 mg/dL — ABNORMAL HIGH (ref 70–99)
Potassium: 4.1 mmol/L (ref 3.5–5.1)
Sodium: 131 mmol/L — ABNORMAL LOW (ref 135–145)

## 2023-04-16 LAB — PREPARE RBC (CROSSMATCH)

## 2023-04-16 MED ORDER — OXYCODONE HCL 5 MG PO TABS
10.0000 mg | ORAL_TABLET | ORAL | Status: DC | PRN
  Administered 2023-04-16 – 2023-04-17 (×6): 10 mg via ORAL
  Administered 2023-04-17: 15 mg via ORAL
  Administered 2023-04-17: 10 mg via ORAL
  Administered 2023-04-18: 15 mg via ORAL
  Administered 2023-04-18 (×2): 10 mg via ORAL
  Filled 2023-04-16 (×5): qty 2
  Filled 2023-04-16: qty 3
  Filled 2023-04-16 (×3): qty 2
  Filled 2023-04-16: qty 3
  Filled 2023-04-16: qty 2

## 2023-04-16 MED ORDER — OXYCODONE HCL 5 MG PO TABS: 5.0000 mg | ORAL_TABLET | ORAL | 0 refills | Status: AC | PRN

## 2023-04-16 MED ORDER — CEFADROXIL 500 MG PO CAPS: 500.0000 mg | ORAL_CAPSULE | Freq: Two times a day (BID) | ORAL | 0 refills | Status: AC

## 2023-04-16 MED ORDER — ASPIRIN 81 MG PO TBEC: 81.0000 mg | DELAYED_RELEASE_TABLET | Freq: Two times a day (BID) | ORAL | 0 refills | Status: AC

## 2023-04-16 MED ORDER — SODIUM CHLORIDE 0.9% IV SOLUTION: Freq: Once | INTRAVENOUS | Status: AC

## 2023-04-16 MED ORDER — GABAPENTIN 100 MG PO CAPS
100.0000 mg | ORAL_CAPSULE | Freq: Two times a day (BID) | ORAL | Status: DC | PRN
  Administered 2023-04-17: 100 mg via ORAL
  Filled 2023-04-16: qty 1

## 2023-04-16 NOTE — [place] FL2 (Signed)
[place] MEDICAID FL2 LEVEL OF CARE FORM     IDENTIFICATION  Patient Name: Cynthia Torres Birthdate: 1945-06-03 Sex: female Admission Date (Current Location): 04/10/2023  [facility] and [place] Number:  Producer, television/film/video and Address:  [facility],  [address] [place], [place] [address]      Provider Number: [id number]  Attending Physician Name and Address:  [place], Eric J, DO  Relative Name and Phone Number:  daughter, Thana Farr @ [phone]    Current Level of Care: Hospital Recommended Level of Care: Skilled Nursing Facility Prior Approval Number:    Date Approved/Denied:   PASRR Number: [id number]  Discharge Plan: SNF    Current Diagnoses: Patient Active Problem List   Diagnosis Date Noted   T2DM (type 2 diabetes mellitus) (HCC) 04/12/2023   Closed dislocation of left hip, initial encounter (HCC) 04/10/2023   S/P lumbar fusion 02/19/2023   Sacroiliac joint pain 12/28/2021   Low back pain, unspecified 09/19/2021   Body mass index (BMI) 40.0-44.9, adult (HCC) 06/12/2021   Essential hypertension 06/12/2021   Gastroesophageal reflux disease 06/12/2021   Gout 06/12/2021   Hypercalcemia 06/12/2021   HLD (hyperlipidemia) 06/12/2021   Irritable bowel syndrome with constipation 06/12/2021   Lumbar spinal stenosis 06/12/2021   Morbid obesity (HCC) 06/12/2021   Osteoarthritis 06/12/2021   Primary insomnia 06/12/2021   Type 2 diabetes mellitus with other specified complication (HCC) 06/12/2021   Primary hyperparathyroidism (HCC) 06/12/2021   Chronic pain 08/24/2020   Myofascial pain 08/24/2020   Sacroiliitis, not elsewhere classified (HCC) 08/24/2020   Sciatica 08/24/2020   Acquired spondylolisthesis 05/05/2020   Displacement of lumbar intervertebral disc without myelopathy 05/05/2020   Acquired ptosis of eyelid of both eyes 09/03/2019   Primary osteoarthritis of first carpometacarpal joint of right hand 05/21/2019   Trigger middle finger of right  hand 05/21/2019   Cranial neuritis 02/23/2018    Orientation RESPIRATION BLADDER Height & Weight     Self, Time, Situation, Place  Normal Continent Weight: 165 lb (74.8 kg) Height:  4\' 11"  (149.9 cm)  BEHAVIORAL SYMPTOMS/MOOD NEUROLOGICAL BOWEL NUTRITION STATUS      Continent Diet (regular)  AMBULATORY STATUS COMMUNICATION OF NEEDS Skin   Limited Assist Verbally Other (Comment) (surgical incision only)                       Personal Care Assistance Level of Assistance  Bathing, Feeding, Dressing Bathing Assistance: Limited assistance Feeding assistance: Independent Dressing Assistance: Limited assistance     Functional Limitations Info  Sight, Hearing, Speech Sight Info: Adequate Hearing Info: Adequate Speech Info: Adequate    SPECIAL CARE FACTORS FREQUENCY  OT (By licensed OT), PT (By licensed PT)     PT Frequency: 5x/wk OT Frequency: 5x/wk            Contractures Contractures Info: Not present    Additional Factors Info  Code Status, Allergies Code Status Info: full Allergies Info: Atorvastatin           Current Medications (04/16/2023):  This is the current hospital active medication list Current Facility-Administered Medications  Medication Dose Route Frequency Provider Last Rate Last Admin   0.9 %  sodium chloride infusion (Manually program via Guardrails IV Fluids)   Intravenous Once [place], Alvira Philips, DO       acetaminophen (TYLENOL) tablet 325-650 mg  325-650 mg Oral Q6H PRN Cecil Cobbs, PA-C   650 mg at 04/15/23 0737   allopurinol (ZYLOPRIM) tablet 300 mg  300 mg Oral Daily Kathie Dike M, PA-C   300 mg at 04/16/23 1021   aspirin EC tablet 81 mg  81 mg Oral BID Cecil Cobbs, PA-C   81 mg at 04/16/23 1021   [START ON 04/17/2023] cefadroxil (DURICEF) capsule 500 mg  500 mg Oral BID Kathie Dike M, PA-C       ceFAZolin (ANCEF) IVPB 2g/100 mL premix  2 g Intravenous Q8H Cecil Cobbs, PA-C 200 mL/hr at 04/16/23 1610 2 g at  04/16/23 9604   colchicine tablet 0.6 mg  0.6 mg Oral Daily Kathie Dike M, PA-C   0.6 mg at 04/16/23 1021   diphenhydrAMINE (BENADRYL) 12.5 MG/5ML elixir 12.5-25 mg  12.5-25 mg Oral Q4H PRN Kathie Dike M, PA-C       docusate sodium (COLACE) capsule 100 mg  100 mg Oral BID Kathie Dike M, PA-C   100 mg at 04/16/23 1021   folic acid (FOLVITE) tablet 1 mg  1 mg Oral Daily [place], Alvira Philips, DO   1 mg at 04/16/23 1021   furosemide (LASIX) tablet 20 mg  20 mg Oral Daily Kathie Dike M, PA-C   20 mg at 04/16/23 1021   gabapentin (NEURONTIN) capsule 100 mg  100 mg Oral Q12H PRN Kathie Dike M, PA-C       HYDROmorphone (DILAUDID) injection 0.5-1 mg  0.5-1 mg Intravenous Q4H PRN Joen Laura, MD   1 mg at 04/16/23 0836   insulin aspart (novoLOG) injection 0-15 Units  0-15 Units Subcutaneous TID WC Cecil Cobbs, PA-C   2 Units at 04/15/23 1657   insulin aspart (novoLOG) injection 0-5 Units  0-5 Units Subcutaneous QHS Cecil Cobbs, PA-C   2 Units at 04/14/23 2231   insulin aspart (novoLOG) injection 4 Units  4 Units Subcutaneous TID WC Cecil Cobbs, PA-C   4 Units at 04/15/23 1657   menthol-cetylpyridinium (CEPACOL) lozenge 3 mg  1 lozenge Oral PRN Cecil Cobbs, PA-C       Or   phenol (CHLORASEPTIC) mouth spray 1 spray  1 spray Mouth/Throat PRN Kathie Dike M, PA-C       methocarbamol (ROBAXIN) tablet 500 mg  500 mg Oral Q6H PRN Joen Laura, MD   500 mg at 04/15/23 1321   Or   methocarbamol (ROBAXIN) injection 500 mg  500 mg Intravenous Q6H PRN Joen Laura, MD       ondansetron (ZOFRAN) tablet 4 mg  4 mg Oral Q6H PRN Joen Laura, MD       Or   ondansetron (ZOFRAN) injection 4 mg  4 mg Intravenous Q6H PRN Joen Laura, MD       oxyCODONE (Oxy IR/ROXICODONE) immediate release tablet 10-15 mg  10-15 mg Oral Q4H PRN Kathie Dike M, PA-C   10 mg at 04/16/23 1025   pantoprazole (PROTONIX) EC tablet 40 mg  40  mg Oral Daily Kathie Dike M, PA-C   40 mg at 04/16/23 1021   polyethylene glycol (MIRALAX / GLYCOLAX) packet 34 g  34 g Oral Daily Kathie Dike M, PA-C   34 g at 04/15/23 5409   senna (SENOKOT) tablet 8.6 mg  1 tablet Oral BID Kathie Dike M, PA-C   8.6 mg at 04/15/23 2226     Discharge Medications: Please see discharge summary for a list of discharge medications.  Relevant Imaging Results:  Relevant Lab Results:   Additional Information SS#: [id number]  Amada Jupiter, LCSW

## 2023-04-16 NOTE — Progress Notes (Signed)
Physical Therapy Treatment Patient Details Name: Cynthia Torres MRN: [id number] DOB: Sep 09, 1945 Today's Date: 04/16/2023   History of Present Illness 78 yo female presents to therapy s/p L THA revision on 04/14/2023 with aseptic loosening of the acetabular component. Pt sustained a fall 1 wk ago and underwent closed reduction of L hip on 2/6 which subsequently dislocated in recovery room. Pt is now s/p L THA revision to dual mobility construct on 04/14/23, posterior hip precautions x 6 wks.  PMH includes but is not limited to: HTN, gout, HLD, hyperparathyroidism, lumbar laminectomy (02/2023), AKI, anemia, L THA, and DM II.    PT Comments  Pt finishing second unit of blood. Agreeable to PT.  Pt requiring min to mod assist of 2 for mobility. Gait distance limited by pain and fatigue. D/c plan remains appropriate, continue PT POC    If plan is discharge home, recommend the following: A lot of help with walking and/or transfers;A lot of help with bathing/dressing/bathroom;Assistance with cooking/housework;Help with stairs or ramp for entrance;Assist for transportation   Can travel by private vehicle     No  Equipment Recommendations  None recommended by PT    Recommendations for Other Services       Precautions / Restrictions Precautions Precautions: Other (comment);Fall;Posterior Hip Recall of Precautions/Restrictions: Intact Precaution/Restrictions Comments: incisional VAC Restrictions LLE Weight Bearing Per Provider Order: Partial weight bearing LLE Partial Weight Bearing Percentage or Pounds: 50%     Mobility  Bed Mobility Overal bed mobility: Needs Assistance       Supine to sit: Mod assist, +2 for physical assistance, +2 for safety/equipment, Used rails     General bed mobility comments: bed pad used to assist lateral scoot, assist to progress LEs off bed, elevate trunk and maintain THP    Transfers Overall transfer level: Needs assistance Equipment used: Rolling walker  (2 wheels) Transfers: Sit to/from Stand Sit to Stand: Mod assist, Min assist, +2 physical assistance, +2 safety/equipment           General transfer comment: cues for hand placement and RLE position/THP    Ambulation/Gait Ambulation/Gait assistance: Min assist, Mod assist, +2 physical assistance, +2 safety/equipment Gait Distance (Feet): 7 Feet Assistive device: Rolling walker (2 wheels) Gait Pattern/deviations: Step-to pattern, Trunk flexed       General Gait Details: cues for sequence, PWB, trunk extension, use of UEs   Stairs             Wheelchair Mobility     Tilt Bed    Modified Rankin (Stroke Patients Only)       Balance   Sitting-balance support: Feet supported, No upper extremity supported Sitting balance-Leahy Scale: Fair     Standing balance support: Reliant on assistive device for balance, During functional activity Standing balance-Leahy Scale: Poor Standing balance comment: reliant on device and external assist                            Communication Communication Communication: No apparent difficulties  Cognition Arousal: Alert Behavior During Therapy: WFL for tasks assessed/performed   PT - Cognitive impairments: No apparent impairments                         Following commands: Intact      Cueing Cueing Techniques: Verbal cues, Tactile cues  Exercises Total Joint Exercises Ankle Circles/Pumps: AROM, Both, 10 reps Quad Sets: AROM, Strengthening, Both, 5 reps  General Comments        Pertinent Vitals/Pain Pain Assessment Pain Assessment: Faces Faces Pain Scale: Hurts whole lot Pain Location: L hip Pain Descriptors / Indicators: Discomfort, Guarding, Grimacing Pain Intervention(s): Limited activity within patient's tolerance, Monitored during session, Premedicated before session, Repositioned    Home Living                          Prior Function            PT Goals (current goals  can now be found in the care plan section) Acute Rehab PT Goals PT Goal Formulation: With patient/family Time For Goal Achievement: 04/29/23 Potential to Achieve Goals: Good Progress towards PT goals: Progressing toward goals    Frequency    7X/week      PT Plan      Co-evaluation              AM-PAC PT "6 Clicks" Mobility   Outcome Measure  Help needed turning from your back to your side while in a flat bed without using bedrails?: Total Help needed moving from lying on your back to sitting on the side of a flat bed without using bedrails?: Total Help needed moving to and from a bed to a chair (including a wheelchair)?: Total Help needed standing up from a chair using your arms (e.g., wheelchair or bedside chair)?: Total Help needed to walk in hospital room?: Total Help needed climbing 3-5 steps with a railing? : Total 6 Click Score: 6    End of Session Equipment Utilized During Treatment: Gait belt Activity Tolerance: Patient limited by fatigue;Patient limited by pain Patient left: in chair;with call bell/phone within reach;with chair alarm set;with family/visitor present Nurse Communication: Mobility status PT Visit Diagnosis: Other abnormalities of gait and mobility (R26.89)     Time: 1353-1411 PT Time Calculation (min) (ACUTE ONLY): 18 min  Charges:    $Gait Training: 8-22 mins PT General Charges $$ ACUTE PT VISIT: 1 Visit                     Delice Bison, PT  Acute Rehab Dept [facility]) [phone]  04/16/2023    [facility] 04/16/2023, 2:54 PM

## 2023-04-16 NOTE — TOC Initial Note (Addendum)
Transition of Care [facility]) - Initial/Assessment Note    Patient Details  Name: Cynthia Torres MRN: [id number] Date of Birth: 02-Dec-1945  [facility]) CM/SW Contact:    Amada Jupiter, LCSW Phone Number: 04/16/2023, 10:53 AM  Clinical Narrative:                  Met with pt today to review PT recommendations for SNF rehab prior to return home.  Pt aware and agreeable with this plan as she does not feel she is ready for home discharge.  Notes that her son is in the home, however, he is working full time.  Pt prefers [facility] if possible.  Will begin SNF work up and bed search.  Have left VM for daughter as well.  1600 ADDENDUM:  Have received SNF bed offer from [facility] and pt has accepted.  Pt and family aware that pt may be cleared for dc tomorrow.  Will start insurance authorization today.   Expected Discharge Plan: Skilled Nursing Facility Barriers to Discharge: Continued Medical Work up, SNF Pending bed offer, Insurance Authorization   Patient Goals and CMS Choice Patient states their goals for this hospitalization and ongoing recovery are:: return home following rehab          Expected Discharge Plan and Services In-house Referral: Clinical Social Work   Post Acute Care Choice: Skilled Nursing Facility Living arrangements for the past 2 months: Single Family Home                 DME Arranged: N/A DME Agency: NA                  Prior Living Arrangements/Services Living arrangements for the past 2 months: Single Family Home Lives with:: Adult Children Patient language and need for interpreter reviewed:: Yes Do you feel safe going back to the place where you live?: Yes      Need for Family Participation in Patient Care: Yes (Comment) Care giver support system in place?: Yes (comment)   Criminal Activity/Legal Involvement Pertinent to Current Situation/Hospitalization: No - Comment as needed  Activities of Daily  Living   ADL Screening (condition at time of admission) Independently performs ADLs?: Yes (appropriate for developmental age) Is the patient deaf or have difficulty hearing?: No Does the patient have difficulty seeing, even when wearing glasses/contacts?: No Does the patient have difficulty concentrating, remembering, or making decisions?: No  Permission Sought/Granted Permission sought to share information with : Family Supports Permission granted to share information with : Yes, Verbal Permission Granted  Share Information with NAME: daughter, Thana Farr @ [phone]  Permission granted to share info w AGENCY: SNFs        Emotional Assessment Appearance:: Appears stated age Attitude/Demeanor/Rapport: Gracious Affect (typically observed): Accepting Orientation: : Oriented to Self, Oriented to Place, Oriented to  Time, Oriented to Situation Alcohol / Substance Use: Not Applicable Psych Involvement: No (comment)  Admission diagnosis:  Subluxation of hip, left, initial encounter (HCC) [S73.002A] Closed dislocation of left hip, initial encounter [facility]) [S73.005A] Patient Active Problem List   Diagnosis Date Noted   T2DM (type 2 diabetes mellitus) (HCC) 04/12/2023   Closed dislocation of left hip, initial encounter (HCC) 04/10/2023   S/P lumbar fusion 02/19/2023   Sacroiliac joint pain 12/28/2021   Low back pain, unspecified 09/19/2021   Body mass index (BMI) 40.0-44.9, adult (HCC) 06/12/2021   Essential hypertension 06/12/2021   Gastroesophageal reflux disease 06/12/2021   Gout  06/12/2021   Hypercalcemia 06/12/2021   HLD (hyperlipidemia) 06/12/2021   Irritable bowel syndrome with constipation 06/12/2021   Lumbar spinal stenosis 06/12/2021   Morbid obesity (HCC) 06/12/2021   Osteoarthritis 06/12/2021   Primary insomnia 06/12/2021   Type 2 diabetes mellitus with other specified complication (HCC) 06/12/2021   Primary hyperparathyroidism (HCC) 06/12/2021   Chronic pain  08/24/2020   Myofascial pain 08/24/2020   Sacroiliitis, not elsewhere classified (HCC) 08/24/2020   Sciatica 08/24/2020   Acquired spondylolisthesis 05/05/2020   Displacement of lumbar intervertebral disc without myelopathy 05/05/2020   Acquired ptosis of eyelid of both eyes 09/03/2019   Primary osteoarthritis of first carpometacarpal joint of right hand 05/21/2019   Trigger middle finger of right hand 05/21/2019   Cranial neuritis 02/23/2018   PCP:  Thana Ates, MD Pharmacy:   [id number] - [place], [place] - [address] AT [facility] OF [address] & [address] [id number] Phone: [phone] Fax: [phone]     Social Drivers of Health (SDOH) Social History: SDOH Screenings   Food Insecurity: No Food Insecurity (04/10/2023)  Housing: Low Risk  (04/10/2023)  Transportation Needs: No Transportation Needs (04/10/2023)  Utilities: Not At Risk (04/10/2023)  Social Connections: Moderately Integrated (04/10/2023)  Tobacco Use: Low Risk  (04/14/2023)   SDOH Interventions:     Readmission Risk Interventions    04/16/2023   10:51 AM  Readmission Risk Prevention Plan  Post Dischage Appt Complete  Medication Screening Complete  Transportation Screening Complete

## 2023-04-16 NOTE — Progress Notes (Signed)
    2 Days Post-Op Procedure(s) (LRB): TOTAL HIP REVISION (Left)  Subjective:  Patient reports pain as moderate, worse than yesterday.  No overnight events.  About to eat breakfast.  Acute post-operative anemia.  Transfused 1 unit yesterday.  Hgb improved to 7.8, now back to 7.0 this morning.    Mobilized some with PT yesterday, ambulated 6 ft.  Objective:   VITALS:   Vitals:   04/15/23 1412 04/15/23 2202 04/16/23 0407 04/16/23 0620  BP: (!) 107/51 136/63 137/60 (!) 134/53  Pulse: 84 91 87 87  Resp: 18 13 15 18   Temp: 97.9 F (36.6 C) 98.5 F (36.9 C) 97.9 F (36.6 C) 99.8 F (37.7 C)  TempSrc: Axillary Oral Oral Oral  SpO2: 97% 91% 98% 96%  Weight:      Height:        AAOx4, in NAD Neurovascular intact Sensation intact distally Intact pulses distally Dorsiflexion/Plantar flexion intact Incision: dressing C/D/I Compartment soft Woundvac holding suction, no collection in cannister Wiggles toes appropriately   Lab Results  Component Value Date   WBC 11.5 (H) 04/16/2023   HGB 7.0 (L) 04/16/2023   HCT 21.4 (L) 04/16/2023   MCV 91.1 04/16/2023   PLT 134 (L) 04/16/2023   BMET    Component Value Date/Time   NA 131 (L) 04/16/2023 0320   K 4.1 04/16/2023 0320   CL 101 04/16/2023 0320   CO2 22 04/16/2023 0320   GLUCOSE 122 (H) 04/16/2023 0320   BUN 19 04/16/2023 0320   CREATININE 0.84 04/16/2023 0320   CALCIUM 9.4 04/16/2023 0320   GFRNONAA >60 04/16/2023 0320      Xray: THA components in good position no advere features  Assessment/Plan: 2 Days Post-Op   Principal Problem:   Closed dislocation of left hip, initial encounter (HCC) Active Problems:   Essential hypertension   Gout   HLD (hyperlipidemia)   Primary hyperparathyroidism (HCC)   T2DM (type 2 diabetes mellitus) (HCC)  S/p L THA revision 04/14/23  Likely acute postoperative anemia.  1 unit transfused yesterday with some improvement, now dropped back to 7.0 today.  May consider repeat  transfusion(s) and/or holding DVT prophylaxis as needed.     Post op recs: WB: 50% PWB LLE, posterior hip precautions x 6 weeks Abx: ancef Imaging: PACU pelvis Xray Dressing: Woundvac, keep intact until follow up DVT prophylaxis: Aspirin 81BID starting POD1 Follow up: 1 week after surgery for a dressing change and wound check with Dr. Blanchie Dessert at [facility].  Address: [address], [place], [place] [address]  Office Phone: [phone]   Cecil Cobbs 04/16/2023, 7:32 AM    Contact information:   Weekdays 7am-5pm epic message Dr. Blanchie Dessert, or call office for patient follow up: [phone] After hours and holidays please check [email or web address] for group call information for [facility]

## 2023-04-16 NOTE — Progress Notes (Signed)
PROGRESS NOTE    Cynthia Torres  WUJ:[id number] DOB: 1945-06-12 DOA: 04/10/2023 PCP: Thana Ates, MD    Brief Narrative:   Cynthia Torres is a 78 y.o. female with past medical history significant for HTN, HLD, DM2, gout who presented to [facility] ED on 04/10/2023 via POV from [facility] orthopedic clinic for concern of hip dislocation.  Apparently patient fell last week with decreased mobility, pain to left hip (history of prior replacement).  X-ray outpatient showing dislocation at urgent care and sent to orthopedic clinic.  After review of the imaging, staff sent patient to the ED for further evaluation management.  In the ED, temperature 98.0 F, HR 63, RR 22, BP 152/75, SpO2 98% on room air. WBC 9.7, hemoglobin 10.7, platelet count 246.  Sodium 139, potassium 3.7, chloride 104, CO2 19, glucose 134, BUN 26, Cram 1.20.  Chest x-ray with no active cardiopulmonary disease process.  Left hip/pelvis x-ray with superior subluxation of the femoral component of the left hip arthroplasty. Orthopedics was consulted, attempted left hip close reduction was unsuccessful.  And patient underwent unsuccessful left hip closed reduction; with recommendation of orthopedics for admission for operative management.  TRH was consulted for admission.     Assessment & Plan:   Left hip prosthetic dislocation Patient initially presenting to urgent care with x-ray findings of dislocation of left hip and was separately sent to her orthopedist office for review.  After review of imaging, staff sent patient to ED for further evaluation management.  Left hip/pelvis x-ray with superior subluxation of the femoral component of the left hip arthroplasty.  Attempted closed reduction was unsuccessful in the ED.  Orthopedics was consulted and patient underwent closed reduction of left hip in the OR on 04/10/2023.  Given the concern for instability and possible loosening of the acetabular component, patient was taken  back to the operating room by orthopedics underwent left hip revision total arthroplasty by Dr. Blanchie Dessert on 04/14/2023.  -- Orthopedics following, appreciate assistance -- 50% PWB LLE, posterior hip precautions x 6 weeks -- ASA 81 mg PO BID for postoperative DVT prophylaxis -- Cefazolin 2 g IV q8h x 3 days perioperatively followed by cefadroxil 500 mg PO BID x 7 days per orthopedics -- Oxycodone 5-10 mg p.o. q4h PRN moderate pain -- Dilaudid 0.5-1 mg IV q4h PRN severe pain -- Robaxin 5 mg p.o. q6h PRN muscle spasms -- PT/OT evaluation: Pending, TOC consulted for anticipated need of SNF placement -- Outpatient follow-up with orthopedics 2 weeks for wound check  Acute postoperative blood loss anemia -- Hgb 10.7>>9.3>8.9>7.7>6.4>7.8>7.0 -- transfused 1u pRBC 2/11; repeat transfusion of 1u pRBC today -- Repeat H&H 2 hours following transfusion -- Repeat CBC in a.m.  Folate deficiency Anemia panel with iron 48, TIBC low at 234, ferritin 302, folate low at 5.0, vitamin B12 385. -- Started on folic acid 1 mg p.o. daily  Essential hypertension -- Hold home amlodipine, furosemide and lisinopril-HCTZ for now -- Closely monitor BP  Hyperlipidemia -- Crestor 5 mg p.o. daily  Type 2 diabetes mellitus On metformin 1000 mg p.o. twice daily at home. -- Hold metformin while inpatient -- Moderate SSI for coverage -- CBG before every meal/at bedtime  GERD -- Protonix 40 mg p.o. daily  Gout -- Colchicine 0.6 mg p.o. daily -- Allopurinol 3 m p.o. daily  DVT prophylaxis: SCDs Start: 04/10/23 2008    Code Status: Full Code Family Communication: No family present at bedside this morning  Disposition Plan:  Level of care: Med-Surg Status is: Inpatient Remains inpatient appropriate because: Blood transfusion, pending SNF placement    Consultants:  Orthopedics  Procedures:  Unsuccessful closed reduction left hip dislocation orthopedics, 2/6 Successful closed reduction in OR,  Orthopedics 2/6 Revision left total hip arthroplasty, Dr. Blanchie Dessert, 2/10  Antimicrobials:  Cefazolin 2/10>>  Subjective: Patient seen examined bedside, lying in bed; resting comfortably.  No complaints this morning.  Pain controlled with medication.  Hemoglobin improved, but remains borderline at 7.0, will transfuse 1 additional unit of PRBC today.  TOC working on SNF placement.  Patient with no other specific complaints, questions, or concerns at this time.  Denies headache, no dizziness, no chest pain, no palpitations, no shortness of breath, no abdominal pain, no fever/chills/night sweats, no nausea/vomiting/diarrhea, no focal weakness, no fatigue, no paresthesias.  No acute events overnight per nursing staff.  Objective: Vitals:   04/15/23 1412 04/15/23 2202 04/16/23 0407 04/16/23 0620  BP: (!) 107/51 136/63 137/60 (!) 134/53  Pulse: 84 91 87 87  Resp: 18 13 15 18   Temp: 97.9 F (36.6 C) 98.5 F (36.9 C) 97.9 F (36.6 C) 99.8 F (37.7 C)  TempSrc: Axillary Oral Oral Oral  SpO2: 97% 91% 98% 96%  Weight:      Height:        Intake/Output Summary (Last 24 hours) at 04/16/2023 1108 Last data filed at 04/16/2023 1037 Gross per 24 hour  Intake 1849.67 ml  Output 424 ml  Net 1425.67 ml   Filed Weights   04/10/23 1226 04/14/23 1055  Weight: 74.8 kg 74.8 kg    Examination:  Physical Exam: GEN: NAD, alert and oriented x 3, elderly in appearance HEENT: NCAT, PERRL, EOMI, sclera clear, MMM PULM: CTAB w/o wheezes/crackles, normal respiratory effort, on 2 L and cannula with SpO2 100% at rest CV: RRR w/o M/G/R GI: abd soft, NTND, NABS, no R/G/M MSK: no peripheral edema, muscle strength globally intact 5/5 bilateral upper/lower extremities, noted left hip surgical incision site with dressing/wound VAC in place, clean/dry/intact NEURO: CN II-XII intact, no focal deficits, sensation to light touch intact PSYCH: normal mood/affect Integumentary: Left hip surgical incision site as  above, otherwise no other concerning rashes/lesions/wounds noted on exposed skin surfaces    Data Reviewed: I have personally reviewed following labs and imaging studies  CBC: Recent Labs  Lab 04/10/23 1236 04/11/23 0433 04/12/23 0401 04/13/23 0827 04/14/23 0312 04/14/23 1613 04/15/23 0306 04/15/23 1709 04/16/23 0320  WBC 9.7   < > 9.9 6.6 6.4  --  11.9*  --  11.5*  NEUTROABS 7.7  --   --   --   --   --   --   --   --   HGB 10.7*   < > 8.5* 9.3* 8.9* 7.7* 6.4* 7.8* 7.0*  HCT 32.7*   < > 26.7* 30.3* 28.6* 25.5* 20.8* 24.8* 21.4*  MCV 88.9   < > 90.8 92.9 93.2  --  94.5  --  91.1  PLT 246   < > 168 169 156  --  146*  --  134*   < > = values in this interval not displayed.   Basic Metabolic Panel: Recent Labs  Lab 04/11/23 0433 04/13/23 0827 04/14/23 0312 04/15/23 0306 04/16/23 0320  NA 138 139 138 134* 131*  K 4.1 3.8 3.7 4.6 4.1  CL 106 106 108 103 101  CO2 23 26 24 22 22   GLUCOSE 207* 94 129* 196* 122*  BUN 25* 21 17 22  19  CREATININE 1.10* 0.84 1.14* 0.92 0.84  CALCIUM 10.3 10.4* 9.9 9.9 9.4   GFR: Estimated Creatinine Clearance: 49.4 mL/min (by C-G formula based on SCr of 0.84 mg/dL). Liver Function Tests: Recent Labs  Lab 04/13/23 0827  AST 11*  ALT 10  ALKPHOS 73  BILITOT 0.5  PROT 5.5*  ALBUMIN 3.0*   No results for input(s): "LIPASE", "AMYLASE" in the last 168 hours. No results for input(s): "AMMONIA" in the last 168 hours. Coagulation Profile: No results for input(s): "INR", "PROTIME" in the last 168 hours. Cardiac Enzymes: No results for input(s): "CKTOTAL", "CKMB", "CKMBINDEX", "TROPONINI" in the last 168 hours. BNP (last 3 results) No results for input(s): "PROBNP" in the last 8760 hours. HbA1C: No results for input(s): "HGBA1C" in the last 72 hours. CBG: Recent Labs  Lab 04/15/23 0733 04/15/23 1217 04/15/23 1634 04/15/23 2201 04/16/23 0757  GLUCAP 155* 225* 144* 90 112*   Lipid Profile: No results for input(s): "CHOL", "HDL",  "LDLCALC", "TRIG", "CHOLHDL", "LDLDIRECT" in the last 72 hours. Thyroid Function Tests: No results for input(s): "TSH", "T4TOTAL", "FREET4", "T3FREE", "THYROIDAB" in the last 72 hours. Anemia Panel: No results for input(s): "VITAMINB12", "FOLATE", "FERRITIN", "TIBC", "IRON", "RETICCTPCT" in the last 72 hours.  Sepsis Labs: No results for input(s): "PROCALCITON", "LATICACIDVEN" in the last 168 hours.  Recent Results (from the past 240 hours)  Culture, Fungus without Smear     Status: None (Preliminary result)   Collection Time: 04/12/23  1:03 PM   Specimen: PATH Cytology Misc. fluid; Synovial Fluid  Result Value Ref Range Status   Specimen Description SYNOVIAL  Final   Special Requests NONE  Final   Culture   Final    NO GROWTH 4 DAYS Performed at [facility] Lab, [address], [place], [place] [address]    Report Status PENDING  Incomplete  Body fluid culture w Gram Stain     Status: None   Collection Time: 04/12/23  1:03 PM   Specimen: PATH Cytology Misc. fluid; Synovial Fluid  Result Value Ref Range Status   Specimen Description SYNOVIAL  Final   Special Requests NONE  Final   Gram Stain NO WBC SEEN NO ORGANISMS SEEN   Final   Culture   Final    NO GROWTH 3 DAYS Performed at [facility], [address] [place], [place] [address]    Report Status 04/15/2023 FINAL  Final  Surgical pcr screen     Status: None   Collection Time: 04/13/23  4:50 PM   Specimen: Nasal Mucosa; Nasal Swab  Result Value Ref Range Status   MRSA, PCR NEGATIVE NEGATIVE Final   Staphylococcus aureus NEGATIVE NEGATIVE Final    Comment: (NOTE) The Xpert SA Assay (FDA approved for NASAL specimens in patients 57 years of age and older), is one component of a comprehensive surveillance program. It is not intended to diagnose infection nor to guide or monitor treatment. Performed at [facility], [address] [place], [place] [address]   Aerobic/Anaerobic Culture w  Gram Stain (surgical/deep wound)     Status: None (Preliminary result)   Collection Time: 04/14/23 12:52 PM   Specimen: PATH Soft tissue  Result Value Ref Range Status   Specimen Description TISSUE  Final   Special Requests HIP  Final   Gram Stain NO WBC SEEN NO ORGANISMS SEEN   Final   Culture   Final    NO GROWTH 2 DAYS Performed at [facility], [address], [place], [place] [address]    Report Status PENDING  Incomplete  Aerobic/Anaerobic Culture w Gram Stain (surgical/deep wound)     Status: None (Preliminary result)   Collection Time: 04/14/23 12:53 PM   Specimen: PATH Soft tissue  Result Value Ref Range Status   Specimen Description   Final    TISSUE Performed at [facility], [address] [place], [place] [address]    Special Requests   Final    NONE Performed at [facility], [address], [place], [place] [address]    Gram Stain NO WBC SEEN NO ORGANISMS SEEN   Final   Culture   Final    NO GROWTH 2 DAYS Performed at [facility], [address], [place], [place] [address]    Report Status PENDING  Incomplete  Aerobic/Anaerobic Culture w Gram Stain (surgical/deep wound)     Status: None (Preliminary result)   Collection Time: 04/14/23 12:53 PM   Specimen: PATH Soft tissue  Result Value Ref Range Status   Specimen Description   Final    TISSUE Performed at [facility], [address] [place], [place] [address]    Special Requests   Final    NONE Performed at [facility], [address], [place], [place] [address]    Gram Stain NO WBC SEEN NO ORGANISMS SEEN   Final   Culture   Final    NO GROWTH 2 DAYS Performed at [facility], [address], [place], [place] [address]    Report Status PENDING  Incomplete         Radiology Studies: DG HIP UNILAT W OR W/O PELVIS 2-3 VIEWS LEFT Result Date: 04/14/2023 CLINICAL DATA:  Postop left hip arthroplasty revision. EXAM: DG HIP  (WITH OR WITHOUT PELVIS) 2-3V LEFT COMPARISON:  Preoperative imaging. FINDINGS: New left hip arthroplasty in expected alignment. No periprosthetic lucency or fracture. Recent postsurgical change includes air and edema in the soft tissues. Stable appearance of right hip arthroplasty. Lateral wound VAC in place. IMPRESSION: Revision left hip arthroplasty without immediate postoperative complication. Electronically Signed   By: Narda Rutherford M.D.   On: 04/14/2023 16:28   DG Pelvis Portable Result Date: 04/14/2023 CLINICAL DATA:  Intra op left hip revision EXAM: PORTABLE PELVIS 1-2 VIEWS COMPARISON:  04/10/2023 FINDINGS: Cross-table intraoperative view of the pelvis obtained in the operating room. Portions of a left hip arthroplasty are visualized. Prior right hip arthroplasty in place. IMPRESSION: Intraoperative view during left hip arthroplasty revision. Electronically Signed   By: Narda Rutherford M.D.   On: 04/14/2023 16:27        Scheduled Meds:  sodium chloride   Intravenous Once   allopurinol  300 mg Oral Daily   aspirin EC  81 mg Oral BID   [START ON 04/17/2023] cefadroxil  500 mg Oral BID   colchicine  0.6 mg Oral Daily   docusate sodium  100 mg Oral BID   folic acid  1 mg Oral Daily   furosemide  20 mg Oral Daily   insulin aspart  0-15 Units Subcutaneous TID WC   insulin aspart  0-5 Units Subcutaneous QHS   insulin aspart  4 Units Subcutaneous TID WC   pantoprazole  40 mg Oral Daily   polyethylene glycol  34 g Oral Daily   senna  1 tablet Oral BID   Continuous Infusions:   ceFAZolin (ANCEF) IV 2 g (04/16/23 0624)  LOS: 5 days    Time spent: 53 minutes spent on chart review, discussion with nursing staff, consultants, updating family and interview/physical exam; more than 50% of that time was spent in counseling and/or coordination of care.    Alvira Philips [place], DO [facility] Available via Epic secure chat 7am-7pm After these hours, please refer to coverage  provider listed on [email or web address] 04/16/2023, 11:08 AM

## 2023-04-16 NOTE — Evaluation (Signed)
Occupational Therapy Evaluation Patient Details Name: Cynthia Torres MRN: [id number] DOB: June 15, 1945 Today's Date: 04/16/2023   History of Present Illness   History of Present Illness: 78 yo female presents to therapy s/p L THA revision on 04/14/2023 with aseptic loosening of the acetabular component. Pt sustained a fall 1 wk ago and underwent closed reduction of L hip on 2/6 which subsequently dislocated in recovery room. Pt is now s/p L THA revision to dual mobility construct on 2/10/25r hip precautions x 6 wks. Pt PMH includes but is not limited to: HTN, gout, HLD, hyperparathyroidism, lumbar laminectomy (02/2023), AKI, anemia, L THA, and DM II.     Clinical Impressions Pt at this time was limited to pain. She requested to attempt to try to urinate onto bed pan and requires max assist with bed mobility. Pt then started to shake in pain and became tearful. Nursing was made aware at this time. She was repositioned and set up for breakfast as reporting they could not do anything further. Patient will benefit from continued inpatient follow up therapy, <3 hours/day.      If plan is discharge home, recommend the following:   Two people to help with walking and/or transfers;Two people to help with bathing/dressing/bathroom;Assistance with cooking/housework;Assistance with feeding;Direct supervision/assist for medications management;Direct supervision/assist for financial management;Assist for transportation;Help with stairs or ramp for entrance     Functional Status Assessment   Patient has had a recent decline in their functional status and demonstrates the ability to make significant improvements in function in a reasonable and predictable amount of time.     Equipment Recommendations    (TBD at next level of care)     Recommendations for Other Services         Precautions/Restrictions   Precautions Precautions: Other (comment);Fall;Posterior Hip Precaution/Restrictions  Comments: incisional VAC Restrictions Weight Bearing Restrictions Per Provider Order: Yes LLE Weight Bearing Per Provider Order: Partial weight bearing LLE Partial Weight Bearing Percentage or Pounds: 50 Other Position/Activity Restrictions: posterior hip precautions     Mobility Bed Mobility Overal bed mobility: Needs Assistance Bed Mobility: Rolling Rolling: Max assist, Used rails              Transfers                   General transfer comment: deffered due to pain levels      Balance                                           ADL either performed or assessed with clinical judgement   ADL Overall ADL's : Needs assistance/impaired Eating/Feeding: Set up;Sitting;Bed level   Grooming: Sitting;Bed level;Contact guard assist   Upper Body Bathing: Bed level;Minimal assistance;Moderate assistance   Lower Body Bathing: Maximal assistance;Total assistance;Bed level   Upper Body Dressing : Contact guard assist;Sitting;Bed level   Lower Body Dressing: Maximal assistance;Total assistance;Bed level       Toileting- Clothing Manipulation and Hygiene: Total assistance;Bed level         General ADL Comments: deffered transfers due to pain leve     Vision         Perception         Praxis         Pertinent Vitals/Pain Pain Assessment Pain Assessment: Faces Faces Pain Scale: Hurts worst Pain Location: L hip Pain Descriptors / Indicators: Discomfort,  Guarding, Grimacing Pain Intervention(s): Limited activity within patient's tolerance, Monitored during session, Repositioned, Patient requesting pain meds-RN notified, Relaxation, Ice applied     Extremity/Trunk Assessment Upper Extremity Assessment Upper Extremity Assessment: Generalized weakness   Lower Extremity Assessment Lower Extremity Assessment: Defer to PT evaluation       Communication Communication Communication: No apparent difficulties   Cognition Arousal:  Alert Behavior During Therapy: WFL for tasks assessed/performed Cognition: No apparent impairments                               Following commands: Intact       Cueing  General Comments   Cueing Techniques: Verbal cues;Tactile cues      Exercises     Shoulder Instructions      Home Living Family/patient expects to be discharged to:: Private residence Living Arrangements: Children Available Help at Discharge: Family;Available PRN/intermittently Type of Home: House Home Access: Level entry     Home Layout: One level     Bathroom Shower/Tub: Producer, television/film/video: Standard     Home Equipment: Cane - single point   Additional Comments: son works all day      Prior Functioning/Environment Prior Level of Function : Independent/Modified Independent;Working/employed;Driving             Mobility Comments: amb I'ly prior back surgery ~ 1 month ago      OT Problem List: Decreased strength;Decreased activity tolerance;Impaired balance (sitting and/or standing);Decreased knowledge of use of DME or AE;Cardiopulmonary status limiting activity;Pain   OT Treatment/Interventions: Self-care/ADL training;Therapeutic exercise;Therapeutic activities;Patient/family education;Balance training      OT Goals(Current goals can be found in the care plan section)   Acute Rehab OT Goals Patient Stated Goal: to have less pain OT Goal Formulation: With patient Time For Goal Achievement: 04/30/23 Potential to Achieve Goals: Fair   OT Frequency:  Min 1X/week    Co-evaluation              AM-PAC OT "6 Clicks" Daily Activity     Outcome Measure Help from another person eating meals?: None Help from another person taking care of personal grooming?: A Little Help from another person toileting, which includes using toliet, bedpan, or urinal?: Total Help from another person bathing (including washing, rinsing, drying)?: Total Help from another  person to put on and taking off regular upper body clothing?: A Lot Help from another person to put on and taking off regular lower body clothing?: Total 6 Click Score: 12   End of Session Nurse Communication: Mobility status  Activity Tolerance: Patient limited by pain Patient left: in bed;with call bell/phone within reach;with bed alarm set  OT Visit Diagnosis: Unsteadiness on feet (R26.81);Other abnormalities of gait and mobility (R26.89);Repeated falls (R29.6);Muscle weakness (generalized) (M62.81);Pain Pain - Right/Left: Left Pain - part of body: Hip                Time: 0722-0759 OT Time Calculation (min): 37 min Charges:  OT General Charges $OT Visit: 1 Visit OT Evaluation $OT Eval Moderate Complexity: 1 Mod OT Treatments $Self Care/Home Management : 8-22 mins  Presley Raddle OTR/L  Acute Rehab Services  [phone] office number   Alphia Moh 04/16/2023, 8:08 AM

## 2023-04-17 ENCOUNTER — Encounter (HOSPITAL_COMMUNITY): Payer: Self-pay | Admitting: Orthopedic Surgery

## 2023-04-17 DIAGNOSIS — S73005A Unspecified dislocation of left hip, initial encounter: Secondary | ICD-10-CM | POA: Diagnosis not present

## 2023-04-17 LAB — TYPE AND SCREEN
ABO/RH(D): B POS
Antibody Screen: NEGATIVE
Unit division: 0
Unit division: 0

## 2023-04-17 LAB — BPAM RBC
Blood Product Expiration Date: 202503152359
Blood Product Expiration Date: 202503162359
ISSUE DATE / TIME: 202502111030
ISSUE DATE / TIME: 202502121121
Unit Type and Rh: 7300
Unit Type and Rh: 7300

## 2023-04-17 LAB — BASIC METABOLIC PANEL
Anion gap: 8 (ref 5–15)
BUN: 15 mg/dL (ref 8–23)
CO2: 23 mmol/L (ref 22–32)
Calcium: 9.7 mg/dL (ref 8.9–10.3)
Chloride: 105 mmol/L (ref 98–111)
Creatinine, Ser: 0.74 mg/dL (ref 0.44–1.00)
GFR, Estimated: >60 mL/min (ref >60)
Glucose, Bld: 123 mg/dL — ABNORMAL HIGH (ref 70–99)
Potassium: 3.4 mmol/L — ABNORMAL LOW (ref 3.5–5.1)
Sodium: 136 mmol/L (ref 135–145)

## 2023-04-17 LAB — GLUCOSE, CAPILLARY
Glucose-Capillary: 116 mg/dL — ABNORMAL HIGH (ref 70–99)
Glucose-Capillary: 155 mg/dL — ABNORMAL HIGH (ref 70–99)
Glucose-Capillary: 116 mg/dL — ABNORMAL HIGH (ref 70–99)
Glucose-Capillary: 240 mg/dL — ABNORMAL HIGH (ref 70–99)

## 2023-04-17 LAB — CBC
HCT: 23.7 % — ABNORMAL LOW (ref 36.0–46.0)
Hemoglobin: 7.4 g/dL — ABNORMAL LOW (ref 12.0–15.0)
MCH: 29.4 pg (ref 26.0–34.0)
MCHC: 31.2 g/dL (ref 30.0–36.0)
MCV: 94 fL (ref 80.0–100.0)
Platelets: 136 10*3/uL — ABNORMAL LOW (ref 150–400)
RBC: 2.52 MIL/uL — ABNORMAL LOW (ref 3.87–5.11)
RDW: 14.3 % (ref 11.5–15.5)
WBC: 11.6 10*3/uL — ABNORMAL HIGH (ref 4.0–10.5)
nRBC: 0 % (ref 0.0–0.2)

## 2023-04-17 LAB — HEMOGLOBIN AND HEMATOCRIT, BLOOD
HCT: 23.7 % — ABNORMAL LOW (ref 36.0–46.0)
Hemoglobin: 7.5 g/dL — ABNORMAL LOW (ref 12.0–15.0)

## 2023-04-17 MED ORDER — POTASSIUM CHLORIDE CRYS ER 20 MEQ PO TBCR
30.0000 meq | EXTENDED_RELEASE_TABLET | ORAL | Status: AC
  Administered 2023-04-17 (×2): 30 meq via ORAL
  Filled 2023-04-17 (×2): qty 1

## 2023-04-17 NOTE — Progress Notes (Signed)
Physical Therapy Treatment Patient Details Name: Cynthia Torres MRN: [id number] DOB: [birth year] Today's Date: 04/17/2023   History of Present Illness 78 yo female presents to therapy s/p L THA revision on 04/14/2023 with aseptic loosening of the acetabular component. Pt sustained a fall 1 wk ago and underwent closed reduction of L hip on 2/6 which subsequently dislocated in recovery room. Pt is now s/p L THA revision to dual mobility construct on 04/14/23, posterior hip precautions x 6 wks. Pt with post op ABLA, received 1 unit blood 2/11 and 2/12.  PMH includes but is not limited to: HTN, gout, HLD, hyperparathyroidism, lumbar laminectomy (02/2023), AKI, anemia, L THA, and DM II.    PT Comments  Pt making steady progress, amb ~ 20' with RW and min to mod assist, +2 for chair follow/safety.  activity and gait distance limited by fatigue however improved from prior sessions.  Hgb 7.5 today.    If plan is discharge home, recommend the following: A lot of help with walking and/or transfers;A lot of help with bathing/dressing/bathroom;Assistance with cooking/housework;Help with stairs or ramp for entrance;Assist for transportation   Can travel by private vehicle     No  Equipment Recommendations  None recommended by PT    Recommendations for Other Services       Precautions / Restrictions Precautions Precautions: Other (comment);Fall;Posterior Hip Recall of Precautions/Restrictions: Intact Precaution/Restrictions Comments: incisional VAC Restrictions LLE Weight Bearing Per Provider Order: Partial weight bearing LLE Partial Weight Bearing Percentage or Pounds: 50     Mobility  Bed Mobility Overal bed mobility: Needs Assistance Bed Mobility: Supine to Sit     Supine to sit: Mod assist, +2 for physical assistance, +2 for safety/equipment, Used rails     General bed mobility comments: bed pad used to assist lateral scoot, assist to progress LEs off bed, elevate trunk and maintain THP,  improved pt effort and ability to self assist trunk to upright    Transfers Overall transfer level: Needs assistance Equipment used: Rolling walker (2 wheels) Transfers: Sit to/from Stand Sit to Stand: Min assist, +2 safety/equipment           General transfer comment: cues for hand placement and RLE position/THP    Ambulation/Gait Ambulation/Gait assistance: Min assist, Mod assist, +2 safety/equipment Gait Distance (Feet): 20 Feet Assistive device: Rolling walker (2 wheels) Gait Pattern/deviations: Step-to pattern, Trunk flexed       General Gait Details: cues for sequence, PWB, trunk extension, use of UEs; incr time to advance RLE with cues for offloading/adhering to [facility] LLE   Stairs             Wheelchair Mobility     Tilt Bed    Modified Rankin (Stroke Patients Only)       Balance   Sitting-balance support: Feet supported, No upper extremity supported Sitting balance-Leahy Scale: Fair     Standing balance support: Reliant on assistive device for balance, During functional activity Standing balance-Leahy Scale: Poor Standing balance comment: reliant on device and external assist                            Communication Communication Communication: No apparent difficulties  Cognition Arousal: Alert Behavior During Therapy: WFL for tasks assessed/performed   PT - Cognitive impairments: No apparent impairments                         Following commands: Intact  Cueing Cueing Techniques: Verbal cues, Tactile cues, Visual cues  Exercises Total Joint Exercises Ankle Circles/Pumps: AROM, Both, 15 reps Quad Sets: AROM, Strengthening, Both, 5 reps Heel Slides: AAROM, Left, 10 reps    General Comments        Pertinent Vitals/Pain Pain Assessment Pain Assessment: Faces Faces Pain Scale: Hurts even more Pain Location: L hip Pain Descriptors / Indicators: Discomfort, Guarding, Grimacing Pain Intervention(s): Limited  activity within patient's tolerance, Monitored during session, Premedicated before session, Repositioned    Home Living                          Prior Function            PT Goals (current goals can now be found in the care plan section) Acute Rehab PT Goals Patient Stated Goal: rehab PT Goal Formulation: With patient/family Time For Goal Achievement: 04/29/23 Potential to Achieve Goals: Good Progress towards PT goals: Progressing toward goals    Frequency    7X/week      PT Plan      Co-evaluation              AM-PAC PT "6 Clicks" Mobility   Outcome Measure  Help needed turning from your back to your side while in a flat bed without using bedrails?: Total Help needed moving from lying on your back to sitting on the side of a flat bed without using bedrails?: Total Help needed moving to and from a bed to a chair (including a wheelchair)?: A Lot Help needed standing up from a chair using your arms (e.g., wheelchair or bedside chair)?: A Lot Help needed to walk in hospital room?: A Lot Help needed climbing 3-5 steps with a railing? : Total 6 Click Score: 9    End of Session Equipment Utilized During Treatment: Gait belt Activity Tolerance: Patient limited by fatigue;Patient tolerated treatment well Patient left: in chair;with call bell/phone within reach;with chair alarm set;with family/visitor present   PT Visit Diagnosis: Other abnormalities of gait and mobility (R26.89)     Time: 6045-4098 PT Time Calculation (min) (ACUTE ONLY): 24 min  Charges:    $Gait Training: 23-37 mins PT General Charges $$ ACUTE PT VISIT: 1 Visit                     Kaysin Brock, PT  Acute Rehab Dept [facility]) [phone]  04/17/2023    [facility] 04/17/2023, 3:46 PM

## 2023-04-17 NOTE — Progress Notes (Signed)
PROGRESS NOTE    Cynthia Torres  ZOX:[id number] DOB: 1946/01/02 DOA: 04/10/2023 PCP: Thana Ates, MD    Brief Narrative:   Cynthia Torres is a 78 y.o. female with past medical history significant for HTN, HLD, DM2, gout who presented to [facility] ED on 04/10/2023 via POV [facility] for concern of hip dislocation.  Apparently patient fell last week with decreased mobility, pain to left hip (history of prior replacement).  X-ray outpatient showing dislocation at urgent care and sent to orthopedic clinic.  After review of the imaging, staff sent patient to the ED for further evaluation management.  In the ED, temperature 98.0 F, HR 63, RR 22, BP 152/75, SpO2 98% on room air. WBC 9.7, hemoglobin 10.7, platelet count 246.  Sodium 139, potassium 3.7, chloride 104, CO2 19, glucose 134, BUN 26, Cram 1.20.  Chest x-ray with no active cardiopulmonary disease process.  Left hip/pelvis x-ray with superior subluxation of the femoral component of the left hip arthroplasty. Orthopedics was consulted, attempted left hip close reduction was unsuccessful.  And patient underwent unsuccessful left hip closed reduction; with recommendation of orthopedics for admission for operative management.  TRH was consulted for admission.     Assessment & Plan:   Left hip prosthetic dislocation Patient initially presenting to urgent care with x-ray findings of dislocation of left hip and was separately sent to her orthopedist office for review.  After review of imaging, staff sent patient to ED for further evaluation management.  Left hip/pelvis x-ray with superior subluxation of the femoral component of the left hip arthroplasty.  Attempted closed reduction was unsuccessful in the ED.  Orthopedics was consulted and patient underwent closed reduction of left hip in the OR on 04/10/2023.  Given the concern for instability and possible loosening of the acetabular component, patient was taken  back to the operating room by orthopedics underwent left hip revision total arthroplasty by Dr. Blanchie Dessert on 04/14/2023.  -- Orthopedics following, appreciate assistance -- 50% PWB LLE, posterior hip precautions x 6 weeks -- ASA 81 mg PO BID for postoperative DVT prophylaxis -- Cefadroxil 500 mg PO BID x 7 days per orthopedics -- Oxycodone 5-10 mg p.o. q4h PRN moderate pain -- Dilaudid 0.5-1 mg IV q4h PRN severe pain -- Robaxin 5 mg p.o. q6h PRN muscle spasms -- PT/OT evaluation: Recommend SNF placement, TOC following -- Outpatient follow-up with orthopedics 2 weeks for wound check  Acute postoperative blood loss anemia -- Hgb 10.7>>9.3>8.9>7.7>6.4>7.8>7.0>8.0>7.4 -- transfused 1u pRBC 2/11 and 1 u pRBC 2/12 -- Repeat H&H at 12 PM today, if continues to climb will transfuse additional unit and may need to hold aspirin -- Repeat CBC in a.m.  Folate deficiency Anemia panel with iron 48, TIBC low at 234, ferritin 302, folate low at 5.0, vitamin B12 385. -- Started on folic acid 1 mg p.o. daily  Essential hypertension -- Hold home amlodipine, furosemide and lisinopril-HCTZ for now -- Closely monitor BP  Hyperlipidemia -- Crestor 5 mg p.o. daily  Type 2 diabetes mellitus On metformin 1000 mg p.o. twice daily at home. -- Hold metformin while inpatient -- Moderate SSI for coverage -- CBG before every meal/at bedtime  GERD -- Protonix 40 mg p.o. daily  Gout -- Colchicine 0.6 mg p.o. daily -- Allopurinol 3 m p.o. daily  DVT prophylaxis: SCDs Start: 04/10/23 2008    Code Status: Full Code Family Communication: No family present at bedside this morning  Disposition Plan:  Level  of care: Med-Surg Status is: Inpatient Remains inpatient appropriate because: Needs stability of hemoglobin, may need further transfusion, once stable anticipate SNF discharge    Consultants:  Orthopedics  Procedures:  Unsuccessful closed reduction left hip dislocation orthopedics, 2/6 Successful  closed reduction in OR, Orthopedics 2/6 Revision left total hip arthroplasty, Dr. Blanchie Dessert, 2/10  Antimicrobials:  Cefazolin 2/10 - 2/13 Cefadroxil 2/13>>  Subjective: Patient seen examined bedside, lying in bed; resting comfortably.  Eating breakfast.  No complaints this morning.  Pain remains controlled.  Hemoglobin continues to downtrend, transfused yesterday up to 8.0, now back to 7.4.  Discussed will repeat hemoglobin this afternoon and if continues to downtrend will need further transfusion and likely holding of aspirin. Patient with no other specific complaints, questions, or concerns at this time.  Denies headache, no dizziness, no chest pain, no palpitations, no shortness of breath, no abdominal pain, no fever/chills/night sweats, no nausea/vomiting/diarrhea, no focal weakness, no fatigue, no paresthesias.  No acute events overnight per nursing staff.  Objective: Vitals:   04/16/23 1418 04/16/23 2223 04/17/23 0451 04/17/23 0824  BP: (!) 125/57 128/63 125/61 (!) 125/56  Pulse: 97 99 87 83  Resp: 18 16 20 16   Temp: 98.3 F (36.8 C) 98.1 F (36.7 C) 98.7 F (37.1 C) 98.2 F (36.8 C)  TempSrc: Oral Oral Oral   SpO2: 98% 93% 90% 90%  Weight:      Height:        Intake/Output Summary (Last 24 hours) at 04/17/2023 1124 Last data filed at 04/17/2023 0930 Gross per 24 hour  Intake 1304 ml  Output 900 ml  Net 404 ml   Filed Weights   04/10/23 1226 04/14/23 1055  Weight: 74.8 kg 74.8 kg    Examination:  Physical Exam: GEN: NAD, alert and oriented x 3, elderly in appearance HEENT: NCAT, PERRL, EOMI, sclera clear, MMM PULM: CTAB w/o wheezes/crackles, normal respiratory effort, on room air CV: RRR w/o M/G/R GI: abd soft, NTND, NABS, no R/G/M MSK: no peripheral edema, muscle strength globally intact 5/5 bilateral upper/lower extremities, noted left hip surgical incision site with dressing/wound VAC in place, clean/dry/intact NEURO: CN II-XII intact, no focal deficits,  sensation to light touch intact PSYCH: normal mood/affect Integumentary: Left hip surgical incision site as above, otherwise no other concerning rashes/lesions/wounds noted on exposed skin surfaces    Data Reviewed: I have personally reviewed following labs and imaging studies  CBC: Recent Labs  Lab 04/10/23 1236 04/11/23 0433 04/13/23 0827 04/14/23 0312 04/14/23 1613 04/15/23 0306 04/15/23 1709 04/16/23 0320 04/16/23 1619 04/17/23 0320  WBC 9.7   < > 6.6 6.4  --  11.9*  --  11.5*  --  11.6*  NEUTROABS 7.7  --   --   --   --   --   --   --   --   --   HGB 10.7*   < > 9.3* 8.9*   < > 6.4* 7.8* 7.0* 8.0* 7.4*  HCT 32.7*   < > 30.3* 28.6*   < > 20.8* 24.8* 21.4* 25.0* 23.7*  MCV 88.9   < > 92.9 93.2  --  94.5  --  91.1  --  94.0  PLT 246   < > 169 156  --  146*  --  134*  --  136*   < > = values in this interval not displayed.   Basic Metabolic Panel: Recent Labs  Lab 04/13/23 0827 04/14/23 0312 04/15/23 0306 04/16/23 0320 04/17/23 0320  NA  139 138 134* 131* 136  K 3.8 3.7 4.6 4.1 3.4*  CL 106 108 103 101 105  CO2 26 24 22 22 23   GLUCOSE 94 129* 196* 122* 123*  BUN 21 17 22 19 15   CREATININE 0.84 1.14* 0.92 0.84 0.74  CALCIUM 10.4* 9.9 9.9 9.4 9.7   GFR: Estimated Creatinine Clearance: 51.9 mL/min (by C-G formula based on SCr of 0.74 mg/dL). Liver Function Tests: Recent Labs  Lab 04/13/23 0827  AST 11*  ALT 10  ALKPHOS 73  BILITOT 0.5  PROT 5.5*  ALBUMIN 3.0*   No results for input(s): "LIPASE", "AMYLASE" in the last 168 hours. No results for input(s): "AMMONIA" in the last 168 hours. Coagulation Profile: No results for input(s): "INR", "PROTIME" in the last 168 hours. Cardiac Enzymes: No results for input(s): "CKTOTAL", "CKMB", "CKMBINDEX", "TROPONINI" in the last 168 hours. BNP (last 3 results) No results for input(s): "PROBNP" in the last 8760 hours. HbA1C: No results for input(s): "HGBA1C" in the last 72 hours. CBG: Recent Labs  Lab  04/16/23 0757 04/16/23 1120 04/16/23 1645 04/16/23 2219 04/17/23 0749  GLUCAP 112* 163* 170* 115* 116*   Lipid Profile: No results for input(s): "CHOL", "HDL", "LDLCALC", "TRIG", "CHOLHDL", "LDLDIRECT" in the last 72 hours. Thyroid Function Tests: No results for input(s): "TSH", "T4TOTAL", "FREET4", "T3FREE", "THYROIDAB" in the last 72 hours. Anemia Panel: No results for input(s): "VITAMINB12", "FOLATE", "FERRITIN", "TIBC", "IRON", "RETICCTPCT" in the last 72 hours.  Sepsis Labs: No results for input(s): "PROCALCITON", "LATICACIDVEN" in the last 168 hours.  Recent Results (from the past 240 hours)  Culture, Fungus without Smear     Status: None (Preliminary result)   Collection Time: 04/12/23  1:03 PM   Specimen: PATH Cytology Misc. fluid; Synovial Fluid  Result Value Ref Range Status   Specimen Description SYNOVIAL  Final   Special Requests NONE  Final   Culture   Final    NO GROWTH 4 DAYS Performed at [facility], [address], [place], [place] [address]    Report Status PENDING  Incomplete  Body fluid culture w Gram Stain     Status: None   Collection Time: 04/12/23  1:03 PM   Specimen: PATH Cytology Misc. fluid; Synovial Fluid  Result Value Ref Range Status   Specimen Description SYNOVIAL  Final   Special Requests NONE  Final   Gram Stain NO WBC SEEN NO ORGANISMS SEEN   Final   Culture   Final    NO GROWTH 3 DAYS Performed at [facility], [address], [place], [place] [address]    Report Status 04/15/2023 FINAL  Final  Surgical pcr screen     Status: None   Collection Time: 04/13/23  4:50 PM   Specimen: Nasal Mucosa; Nasal Swab  Result Value Ref Range Status   MRSA, PCR NEGATIVE NEGATIVE Final   Staphylococcus aureus NEGATIVE NEGATIVE Final    Comment: (NOTE) The Xpert SA Assay (FDA approved for NASAL specimens in patients 49 years of age and older), is one component of a comprehensive surveillance program. It is not intended to diagnose  infection nor to guide or monitor treatment. Performed at [facility], [address], [place], [place] [address]   Aerobic/Anaerobic Culture w Gram Stain (surgical/deep wound)     Status: None (Preliminary result)   Collection Time: 04/14/23 12:52 PM   Specimen: PATH Soft tissue  Result Value Ref Range Status   Specimen Description TISSUE  Final  Special Requests HIP  Final   Gram Stain NO WBC SEEN NO ORGANISMS SEEN   Final   Culture   Final    NO GROWTH 3 DAYS NO ANAEROBES ISOLATED; CULTURE IN PROGRESS FOR 5 DAYS Performed at [facility], [address] [place], [place] [address]    Report Status PENDING  Incomplete  Aerobic/Anaerobic Culture w Gram Stain (surgical/deep wound)     Status: None (Preliminary result)   Collection Time: 04/14/23 12:53 PM   Specimen: PATH Soft tissue  Result Value Ref Range Status   Specimen Description   Final    TISSUE Performed at [facility], [address] [place], [place] [address]    Special Requests   Final    NONE Performed at [facility], [address], [place], [place] [address]    Gram Stain NO WBC SEEN NO ORGANISMS SEEN   Final   Culture   Final    NO GROWTH 3 DAYS NO ANAEROBES ISOLATED; CULTURE IN PROGRESS FOR 5 DAYS Performed at [facility], [address], [place], [place] [address]    Report Status PENDING  Incomplete  Aerobic/Anaerobic Culture w Gram Stain (surgical/deep wound)     Status: None (Preliminary result)   Collection Time: 04/14/23 12:53 PM   Specimen: PATH Soft tissue  Result Value Ref Range Status   Specimen Description   Final    TISSUE Performed at [facility], [address] [place], [place] [address]    Special Requests   Final    NONE Performed at [facility], [address], [place], [place] [address]    Gram Stain NO WBC SEEN NO ORGANISMS SEEN   Final   Culture   Final    NO GROWTH 3 DAYS NO ANAEROBES  ISOLATED; CULTURE IN PROGRESS FOR 5 DAYS Performed at [facility], [address], [place], [place] [address]    Report Status PENDING  Incomplete         Radiology Studies: No results found.       Scheduled Meds:  allopurinol  300 mg Oral Daily   aspirin EC  81 mg Oral BID   cefadroxil  500 mg Oral BID   colchicine  0.6 mg Oral Daily   docusate sodium  100 mg Oral BID   folic acid  1 mg Oral Daily   furosemide  20 mg Oral Daily   insulin aspart  0-15 Units Subcutaneous TID WC   insulin aspart  0-5 Units Subcutaneous QHS   insulin aspart  4 Units Subcutaneous TID WC   pantoprazole  40 mg Oral Daily   polyethylene glycol  34 g Oral Daily   potassium chloride  30 mEq Oral Q3H   senna  1 tablet Oral BID   Continuous Infusions:   ceFAZolin (ANCEF) IV 2 g (04/17/23 0551)     LOS: 6 days    Time spent: 53 minutes spent on chart review, discussion with nursing staff, consultants, updating family and interview/physical exam; more than 50% of that time was spent in counseling and/or coordination of care.    Alvira Philips [place], DO [facility] Available via Epic secure chat 7am-7pm After these hours, please refer to coverage provider listed on [email or web address] 04/17/2023, 11:24 AM

## 2023-04-17 NOTE — Anesthesia Postprocedure Evaluation (Signed)
Anesthesia Post Note  Patient: Cynthia Torres  Procedure(s) Performed: TOTAL HIP REVISION (Left: Hip)     Patient location during evaluation: PACU Anesthesia Type: MAC and Spinal Level of consciousness: oriented and awake and alert Pain management: pain level controlled Vital Signs Assessment: post-procedure vital signs reviewed and stable Respiratory status: spontaneous breathing, respiratory function stable and patient connected to nasal cannula oxygen Cardiovascular status: blood pressure returned to baseline and stable Postop Assessment: no headache, no backache and no apparent nausea or vomiting Anesthetic complications: no   No notable events documented.  Last Vitals:  Vitals:   04/16/23 2223 04/17/23 0451  BP: 128/63 125/61  Pulse: 99 87  Resp: 16 20  Temp: 36.7 C 37.1 C  SpO2: 93% 90%    Last Pain:  Vitals:   04/17/23 0639  TempSrc:   PainSc: Asleep                 Kolbey Teichert S

## 2023-04-17 NOTE — Plan of Care (Signed)
Problem: Activity: Goal: Risk for activity intolerance will decrease Outcome: Progressing   Problem: Nutrition: Goal: Adequate nutrition will be maintained Outcome: Progressing   Problem: Coping: Goal: Level of anxiety will decrease Outcome: Progressing   Problem: Elimination: Goal: Will not experience complications related to urinary retention Outcome: Progressing   Problem: Safety: Goal: Ability to remain free from injury will improve Outcome: Progressing   Problem: Skin Integrity: Goal: Risk for impaired skin integrity will decrease Outcome: Progressing

## 2023-04-17 NOTE — Progress Notes (Signed)
    3 Days Post-Op Procedure(s) (LRB): TOTAL HIP REVISION (Left)  Subjective:  Patient reports pain as moderate, doing better each day. Slept well overnight.  Acute post-operative anemia.  Transfused 1 unit yesterday.  Hgb 7.4 this AM.  Mobilized some with PT yesterday, ambulated 7 ft.  Objective:   VITALS:   Vitals:   04/16/23 1349 04/16/23 1418 04/16/23 2223 04/17/23 0451  BP: (!) 127/53 (!) 125/57 128/63 125/61  Pulse: 97 97 99 87  Resp:  18 16 20   Temp: 97.7 F (36.5 C) 98.3 F (36.8 C) 98.1 F (36.7 C) 98.7 F (37.1 C)  TempSrc: Oral Oral Oral Oral  SpO2: 97% 98% 93% 90%  Weight:      Height:        AAOx4, in NAD Neurovascular intact Sensation intact distally Intact pulses distally Dorsiflexion/Plantar flexion intact Incision: dressing C/D/I Compartment soft Woundvac holding suction, no collection in cannister Wiggles toes appropriately   Lab Results  Component Value Date   WBC 11.6 (H) 04/17/2023   HGB 7.4 (L) 04/17/2023   HCT 23.7 (L) 04/17/2023   MCV 94.0 04/17/2023   PLT 136 (L) 04/17/2023   BMET    Component Value Date/Time   NA 136 04/17/2023 0320   K 3.4 (L) 04/17/2023 0320   CL 105 04/17/2023 0320   CO2 23 04/17/2023 0320   GLUCOSE 123 (H) 04/17/2023 0320   BUN 15 04/17/2023 0320   CREATININE 0.74 04/17/2023 0320   CALCIUM 9.7 04/17/2023 0320   GFRNONAA >60 04/17/2023 0320      Xray: THA components in good position no advere features  Assessment/Plan: 3 Days Post-Op   Principal Problem:   Closed dislocation of left hip, initial encounter (HCC) Active Problems:   Essential hypertension   Gout   HLD (hyperlipidemia)   Primary hyperparathyroidism (HCC)   T2DM (type 2 diabetes mellitus) (HCC)  S/p L THA revision 04/14/23   Post op recs: WB: 50% PWB LLE, posterior hip precautions x 6 weeks Abx: ancef Imaging: PACU pelvis Xray Dressing: Woundvac, keep intact until follow up DVT prophylaxis: Aspirin 81BID starting  POD1 Follow up: 1 week after surgery for a dressing change and wound check with Dr. Blanchie Dessert at [facility].  Address: [address], [place], [place] [address]  Office Phone: [phone]   Darline Faith A Shyniece Scripter 04/17/2023, 7:05 AM    Contact information:   Weekdays 7am-5pm epic message Dr. Blanchie Dessert, or call office for patient follow up: [phone] After hours and holidays please check [email or web address] for group call information for [facility]

## 2023-04-18 DIAGNOSIS — I10 Essential (primary) hypertension: Secondary | ICD-10-CM | POA: Diagnosis not present

## 2023-04-18 DIAGNOSIS — Z96642 Presence of left artificial hip joint: Secondary | ICD-10-CM | POA: Diagnosis not present

## 2023-04-18 DIAGNOSIS — M25551 Pain in right hip: Secondary | ICD-10-CM | POA: Diagnosis not present

## 2023-04-18 DIAGNOSIS — R112 Nausea with vomiting, unspecified: Secondary | ICD-10-CM | POA: Diagnosis not present

## 2023-04-18 DIAGNOSIS — M62838 Other muscle spasm: Secondary | ICD-10-CM | POA: Diagnosis not present

## 2023-04-18 DIAGNOSIS — S73005D Unspecified dislocation of left hip, subsequent encounter: Secondary | ICD-10-CM | POA: Diagnosis not present

## 2023-04-18 DIAGNOSIS — M545 Low back pain, unspecified: Secondary | ICD-10-CM | POA: Diagnosis not present

## 2023-04-18 DIAGNOSIS — M25552 Pain in left hip: Secondary | ICD-10-CM | POA: Diagnosis not present

## 2023-04-18 DIAGNOSIS — E1169 Type 2 diabetes mellitus with other specified complication: Secondary | ICD-10-CM | POA: Diagnosis not present

## 2023-04-18 DIAGNOSIS — T84031D Mechanical loosening of internal left hip prosthetic joint, subsequent encounter: Secondary | ICD-10-CM | POA: Diagnosis not present

## 2023-04-18 DIAGNOSIS — S73002D Unspecified subluxation of left hip, subsequent encounter: Secondary | ICD-10-CM | POA: Diagnosis not present

## 2023-04-18 DIAGNOSIS — G8929 Other chronic pain: Secondary | ICD-10-CM | POA: Diagnosis not present

## 2023-04-18 DIAGNOSIS — K5900 Constipation, unspecified: Secondary | ICD-10-CM | POA: Diagnosis not present

## 2023-04-18 DIAGNOSIS — D62 Acute posthemorrhagic anemia: Secondary | ICD-10-CM | POA: Diagnosis not present

## 2023-04-18 DIAGNOSIS — S73005A Unspecified dislocation of left hip, initial encounter: Secondary | ICD-10-CM | POA: Diagnosis not present

## 2023-04-18 DIAGNOSIS — Z743 Need for continuous supervision: Secondary | ICD-10-CM | POA: Diagnosis not present

## 2023-04-18 DIAGNOSIS — Z7401 Bed confinement status: Secondary | ICD-10-CM | POA: Diagnosis not present

## 2023-04-18 DIAGNOSIS — R2681 Unsteadiness on feet: Secondary | ICD-10-CM | POA: Diagnosis not present

## 2023-04-18 DIAGNOSIS — R600 Localized edema: Secondary | ICD-10-CM | POA: Diagnosis not present

## 2023-04-18 DIAGNOSIS — W19XXXD Unspecified fall, subsequent encounter: Secondary | ICD-10-CM | POA: Diagnosis not present

## 2023-04-18 DIAGNOSIS — E210 Primary hyperparathyroidism: Secondary | ICD-10-CM | POA: Diagnosis not present

## 2023-04-18 DIAGNOSIS — M159 Polyosteoarthritis, unspecified: Secondary | ICD-10-CM | POA: Diagnosis not present

## 2023-04-18 DIAGNOSIS — I739 Peripheral vascular disease, unspecified: Secondary | ICD-10-CM | POA: Diagnosis not present

## 2023-04-18 DIAGNOSIS — M109 Gout, unspecified: Secondary | ICD-10-CM | POA: Diagnosis not present

## 2023-04-18 DIAGNOSIS — E785 Hyperlipidemia, unspecified: Secondary | ICD-10-CM | POA: Diagnosis not present

## 2023-04-18 DIAGNOSIS — M48061 Spinal stenosis, lumbar region without neurogenic claudication: Secondary | ICD-10-CM | POA: Diagnosis not present

## 2023-04-18 DIAGNOSIS — M4310 Spondylolisthesis, site unspecified: Secondary | ICD-10-CM | POA: Diagnosis not present

## 2023-04-18 DIAGNOSIS — S79919A Unspecified injury of unspecified hip, initial encounter: Secondary | ICD-10-CM | POA: Diagnosis not present

## 2023-04-18 LAB — CBC
HCT: 23.7 % — ABNORMAL LOW (ref 36.0–46.0)
Hemoglobin: 7.3 g/dL — ABNORMAL LOW (ref 12.0–15.0)
MCH: 29.6 pg (ref 26.0–34.0)
MCHC: 30.8 g/dL (ref 30.0–36.0)
MCV: 96 fL (ref 80.0–100.0)
Platelets: 155 10*3/uL (ref 150–400)
RBC: 2.47 MIL/uL — ABNORMAL LOW (ref 3.87–5.11)
RDW: 14.3 % (ref 11.5–15.5)
WBC: 10.4 10*3/uL (ref 4.0–10.5)
nRBC: 0 % (ref 0.0–0.2)

## 2023-04-18 LAB — BASIC METABOLIC PANEL
Anion gap: 7 (ref 5–15)
BUN: 13 mg/dL (ref 8–23)
CO2: 25 mmol/L (ref 22–32)
Calcium: 9.9 mg/dL (ref 8.9–10.3)
Chloride: 106 mmol/L (ref 98–111)
Creatinine, Ser: 0.72 mg/dL (ref 0.44–1.00)
GFR, Estimated: >60 mL/min (ref >60)
Glucose, Bld: 96 mg/dL (ref 70–99)
Potassium: 4.2 mmol/L (ref 3.5–5.1)
Sodium: 138 mmol/L (ref 135–145)

## 2023-04-18 LAB — MAGNESIUM: Magnesium: 1.6 mg/dL — ABNORMAL LOW (ref 1.7–2.4)

## 2023-04-18 LAB — GLUCOSE, CAPILLARY
Glucose-Capillary: 119 mg/dL — ABNORMAL HIGH (ref 70–99)
Glucose-Capillary: 181 mg/dL — ABNORMAL HIGH (ref 70–99)

## 2023-04-18 MED ORDER — CEFADROXIL 500 MG PO CAPS: 500.0000 mg | ORAL_CAPSULE | Freq: Two times a day (BID) | ORAL | 0 refills | Status: DC

## 2023-04-18 MED ORDER — ASPIRIN 81 MG PO TBEC: 81.0000 mg | DELAYED_RELEASE_TABLET | Freq: Two times a day (BID) | ORAL | 12 refills | Status: DC

## 2023-04-18 MED ORDER — OXYCODONE HCL 10 MG PO TABS: 10.0000 mg | ORAL_TABLET | ORAL | 0 refills | Status: DC | PRN

## 2023-04-18 MED ORDER — METHOCARBAMOL 500 MG PO TABS: 500.0000 mg | ORAL_TABLET | Freq: Four times a day (QID) | ORAL | 0 refills | Status: DC | PRN

## 2023-04-18 MED ORDER — DOCUSATE SODIUM 100 MG PO CAPS: 100.0000 mg | ORAL_CAPSULE | Freq: Two times a day (BID) | ORAL | 0 refills | Status: AC

## 2023-04-18 MED ORDER — GABAPENTIN 100 MG PO CAPS: 100.0000 mg | ORAL_CAPSULE | Freq: Two times a day (BID) | ORAL | 0 refills | Status: AC | PRN

## 2023-04-18 NOTE — Progress Notes (Signed)
Pt got discharged to [facility]. Report given to the receiving nurse Mia. Pt has no c/o pain. Wound vac to left hip intact. Prevena wound vac attached. Discharge packets with hard prescriptions handed to [facility]. Pt left the hospital via stretcher on [facility] transportation. Daughter aware.

## 2023-04-18 NOTE — Discharge Summary (Signed)
Physician Discharge Summary   Patient: Cynthia Torres MRN: [id number] DOB: 05-Jun-1945  Admit date:     04/10/2023  Discharge date: 04/18/23  Discharge Physician: Emeline General   PCP: Thana Ates, MD   Recommendations at discharge:   Follow-up with orthopedic surgery for 1 week Wound care daily  Discharge Diagnoses: Principal Problem:   Closed dislocation of left hip, initial encounter [facility]) Active Problems:   Essential hypertension   Gout   HLD (hyperlipidemia)   Primary hyperparathyroidism (HCC)   T2DM (type 2 diabetes mellitus) [facility])   Hospital Course:  78 year old patient with history of HTN HLD gout, initially presented to [facility] ED for left hip dislocation, x-ray showed superior subluxation of femoral component of left hip arthroplasty.  Attempted closed reduction was unsuccessful in the ED and orthopedic surgery took patient to the OR and underwent closed reduction of left hip on 02/07/2024.  Subsequently patient started develop signs of instability and possible loosening of the acetabular component, as a result patient was taken back to OR and underwent left hip revision total arthroplasty by Dr. Blanchie Dessert on 04/14/2023   Subsequently patient developed acute blood loss anemia received PRBC x 2 on 2/11 and 2/12 and hemoglobin stabilized.  HTN -Stable, continue home BP meds  IIDM -Continue metformin  Gout -Stable, no acute concern  Assessment and Plan:  Left hip prosthetic dislocation status post revision total arthroplasty -Aspirin 81 mg twice daily for 2 weeks for DVT prophylaxis -Cefadroxil 5 mg twice daily for total of 7 days -Oxycodone as needed for pain -Robaxin as needed for muscle spasm   Pain control - [place] Controlled Substance Reporting System database was reviewed. and patient was instructed, not to drive, operate heavy machinery, perform activities at heights, swimming or participation in water activities or provide baby-sitting  services while on Pain, Sleep and Anxiety Medications; until their outpatient Physician has advised to do so again. Also recommended to not to take more than prescribed Pain, Sleep and Anxiety Medications.  Consultants: Orthopedic surgery Procedures performed: Left hip revision and total arthroplasty Disposition: Skilled nursing facility Diet recommendation:  Cardiac and Carb modified diet DISCHARGE MEDICATION: Allergies as of 04/18/2023       Reactions   Atorvastatin    Other reaction(s): myalgias        Medication List     PAUSE taking these medications    amLODipine 5 MG tablet Wait to take this until your doctor or other care provider tells you to start again. Commonly known as: NORVASC Take 5 mg by mouth daily.   furosemide 20 MG tablet Wait to take this until your doctor or other care provider tells you to start again. Commonly known as: LASIX Take 20 mg by mouth daily.   lisinopril-hydrochlorothiazide 20-25 MG tablet Wait to take this until your doctor or other care provider tells you to start again. Commonly known as: ZESTORETIC Take 1 tablet by mouth daily.       STOP taking these medications    colchicine 0.6 MG tablet   moxifloxacin 0.5 % ophthalmic solution Commonly known as: VIGAMOX   OneTouch Verio test strip Generic drug: glucose blood   oxyCODONE-acetaminophen 5-325 MG tablet Commonly known as: PERCOCET/ROXICET       TAKE these medications    allopurinol 300 MG tablet Commonly known as: ZYLOPRIM Take 300 mg by mouth daily.   aspirin EC 81 MG tablet Take 1 tablet (81 mg total) by mouth 2 (two) times daily for 28  days. Swallow whole.   aspirin EC 81 MG tablet Take 1 tablet (81 mg total) by mouth 2 (two) times daily. Swallow whole.   atenolol 100 MG tablet Commonly known as: TENORMIN Take 100 mg by mouth daily.   cefadroxil 500 MG capsule Commonly known as: DURICEF Take 1 capsule (500 mg total) by mouth 2 (two) times daily for 7  days.   cefadroxil 500 MG capsule Commonly known as: DURICEF Take 1 capsule (500 mg total) by mouth 2 (two) times daily.   docusate sodium 100 MG capsule Commonly known as: COLACE Take 1 capsule (100 mg total) by mouth 2 (two) times daily.   gabapentin 100 MG capsule Commonly known as: NEURONTIN Take 1 capsule (100 mg total) by mouth every 12 (twelve) hours as needed (As needed for moderate to severe pain).   meclizine 25 MG tablet Commonly known as: ANTIVERT Take 1 tablet (25 mg total) by mouth 3 (three) times daily as needed for dizziness.   metFORMIN 500 MG tablet Commonly known as: GLUCOPHAGE Take 1,000 mg by mouth 2 (two) times daily with a meal. 1,000mg  in AM and 500mg  in PM   methocarbamol 500 MG tablet Commonly known as: ROBAXIN Take 1 tablet (500 mg total) by mouth every 6 (six) hours as needed for muscle spasms.   ondansetron 4 MG tablet Commonly known as: ZOFRAN Take 1 tablet (4 mg total) by mouth every 6 (six) hours. What changed:  when to take this reasons to take this   oxyCODONE 5 MG immediate release tablet Commonly known as: Roxicodone Take 1 tablet (5 mg total) by mouth every 4 (four) hours as needed for up to 7 days for severe pain (pain score 7-10) or moderate pain (pain score 4-6).   Oxycodone HCl 10 MG Tabs Take 1-1.5 tablets (10-15 mg total) by mouth every 4 (four) hours as needed for moderate pain (pain score 4-6) (pain score 4-6).   potassium chloride 10 MEQ CR capsule Commonly known as: MICRO-K Take 10 mEq by mouth 2 (two) times daily.   rosuvastatin 10 MG tablet Commonly known as: CRESTOR Take 5 mg by mouth once.   Vitamin D (Ergocalciferol) 1.25 MG (50000 UNIT) Caps capsule Commonly known as: DRISDOL Take 50,000 Units by mouth once a week.        Follow-up Information     Joen Laura, MD Follow up in 1 week(s).   Specialty: Orthopedic Surgery Contact information: [address] [place] [address] [place]  [address] [phone]                Discharge Exam: Ceasar Mons Weights   04/10/23 1226 04/14/23 1055  Weight: 74.8 kg 74.8 kg   Eyes: PERRL, lids and conjunctivae normal ENMT: Mucous membranes are moist. Posterior pharynx clear of any exudate or lesions.Normal dentition.  Neck: normal, supple, no masses, no thyromegaly Respiratory: clear to auscultation bilaterally, no wheezing, no crackles. Normal respiratory effort. No accessory muscle use.  Cardiovascular: Regular rate and rhythm, no murmurs / rubs / gallops. No extremity edema. 2+ pedal pulses. No carotid bruits.  Abdomen: no tenderness, no masses palpated. No hepatosplenomegaly. Bowel sounds positive.  Musculoskeletal: no clubbing / cyanosis. No joint deformity upper and lower extremities. Good ROM, no contractures. Normal muscle tone.  Skin: no rashes, lesions, ulcers. No induration Neurologic: CN 2-12 grossly intact. Sensation intact, DTR normal.  Muscle strength 5/5 on both sides Psychiatric: Normal judgment and insight. Alert and oriented x 3. Normal mood.  Condition at discharge: fair  The results of significant diagnostics from this hospitalization (including imaging, microbiology, ancillary and laboratory) are listed below for reference.   Imaging Studies: DG HIP UNILAT W OR W/O PELVIS 2-3 VIEWS LEFT Result Date: 04/14/2023 CLINICAL DATA:  Postop left hip arthroplasty revision. EXAM: DG HIP (WITH OR WITHOUT PELVIS) 2-3V LEFT COMPARISON:  Preoperative imaging. FINDINGS: New left hip arthroplasty in expected alignment. No periprosthetic lucency or fracture. Recent postsurgical change includes air and edema in the soft tissues. Stable appearance of right hip arthroplasty. Lateral wound VAC in place. IMPRESSION: Revision left hip arthroplasty without immediate postoperative complication. Electronically Signed   By: Narda Rutherford M.D.   On: 04/14/2023 16:28   DG Pelvis Portable Result Date: 04/14/2023 CLINICAL DATA:  Intra  op left hip revision EXAM: PORTABLE PELVIS 1-2 VIEWS COMPARISON:  04/10/2023 FINDINGS: Cross-table intraoperative view of the pelvis obtained in the operating room. Portions of a left hip arthroplasty are visualized. Prior right hip arthroplasty in place. IMPRESSION: Intraoperative view during left hip arthroplasty revision. Electronically Signed   By: Narda Rutherford M.D.   On: 04/14/2023 16:27   DG FL ASP/INJ MAJOR (SHOULDER, HIP, KNEE) Result Date: 04/12/2023 CLINICAL DATA:  CLINICAL DATA Patient with prior left hip replacement s/p fall with dislocation. Joint aspiration requested. EXAM: DIAGNOSTIC LEFT HIP ASPIRATION UNDER FLUOROSCOPIC GUIDANCE FLUOROSCOPY TIME:  Radiation Exposure Index (if provided by the fluoroscopic device): 4.7 PROCEDURE: The risks and benefits of the procedure were discussed with the patient, and written informed consent was obtained. The patient stated no history of allergy to contrast media. A formal timeout procedure was performed with the patient according to departmental protocol. The patient was placed supine on the fluoroscopy table and the left replacement hardware was identified under fluoroscopy. The skin overlying the space was subsequently cleaned with betadine and a sterile drape was placed over the area of interest. 5 ml 1% Lidocaine was used to anesthetize the skin around the needle insertion site. Initial attempts by a 20 gauge spinal needle inserted into the left hip to the level of the replacement hardware yielded no fluid despite position confirmation under fluoroscopy. 2 mL of normal saline was then used to irrigate the space and 0.5 mL bloody fluid was then aspirated. The needle was removed and hemostasis was achieved. Sample sent to the lab for testing. IMPRESSION: Technically successful left replacement hip aspiration. Performed by: Loyce Dys PA-C Electronically Signed   By: Malachy Moan M.D.   On: 04/12/2023 13:04   CT HIP LEFT WO CONTRAST Result  Date: 04/11/2023 CLINICAL DATA:  Hip dislocation.  Postreduction. EXAM: CT OF THE LEFT HIP WITHOUT CONTRAST TECHNIQUE: Multidetector CT imaging of the left hip was performed according to the standard protocol. Multiplanar CT image reconstructions were also generated. RADIATION DOSE REDUCTION: This exam was performed according to the departmental dose-optimization program which includes automated exposure control, adjustment of the mA and/or kV according to patient size and/or use of iterative reconstruction technique. COMPARISON:  Left hip radiographs 04/10/2023, intraoperative fluoroscopy left hip arthroplasty closed reduction 04/10/2023, AP left hip 04/10/2023, AP pelvis and lateral view of the left hip 04/10/2023, pelvis and left hip radiographs 06/14/2022; CT chest, abdomen, and pelvis 02/06/2018 FINDINGS: Bones/Joint/Cartilage Postsurgical changes are seen of prior total left hip arthroplasty. There is streak artifact around the hardware that somewhat limits evaluation of the prosthetic borders. Within this limitation, no perihardware lucency is seen around the femoral stem component. There are lucencies around the acetabular cup measuring up  to 6 mm in transverse thickness superiorly (coronal series 6, image 15) and greater lucency around the inferior aspect of the right acetabular cup measuring up to 9 mm in thickness (coronal series 6, image 57). There is up to 2 mm lucency around the more inferior posterior acetabular cup screw (axial series 3, image 65 and coronal series 6, image 47). These perihardware lucencies are new from 02/06/2018. There is no abnormal lucency seen around the superior acetabular screw (axial image 51 and coronal image 51). The femoral head prosthesis is directed anteriorly, and is laterally, anteriorly, superiorly subluxed from the expected seen within the acetabular cup (axial image 58, coronal image 67, sagittal image 52). Ligaments Suboptimally assessed by CT. Muscles and Tendons  Normal size and density of the regional musculature. No gross tendon tear is visualized. Soft tissues Mild stranding within the lateral left hip subcutaneous fat. Mild-to-moderate distal sigmoid descending colon and sigmoid partially visualized diverticulosis. Moderate to high-grade atherosclerotic calcifications. 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 IMPRESSION: 1. Postsurgical changes of prior total left hip arthroplasty. The femoral head prosthesis is directed anteriorly, and is laterally, anteriorly, superiorly subluxed from the expected seen within the acetabular cup. 2. There are lucencies around the acetabular cup measuring up to 6 mm in transverse thickness superiorly and around the inferior aspect of the right acetabular cup measuring up to 9 mm in thickness. There is up to 2 mm lucency around the more inferior posterior acetabular cup screw. These perihardware lucencies are new from 02/06/2018 CT and are concerning for hardware loosening. Electronically Signed   By: Neita Garnet M.D.   On: 04/11/2023 13:27   DG HIP UNILAT W OR W/O PELVIS 2-3 VIEWS LEFT Result Date: 04/10/2023 CLINICAL DATA:  Status post reduction of hip dislocation. EXAM: DG HIP (WITH OR WITHOUT PELVIS) 2-3V LEFT COMPARISON:  Same day. FINDINGS: There remains superior subluxation of femoral component of left hip arthroplasty. IMPRESSION: Superior subluxation of femoral component of left hip arthroplasty is again noted. Electronically Signed   By: Lupita Raider M.D.   On: 04/10/2023 20:17   DG HIP UNILAT WITH PELVIS 2-3 VIEWS LEFT Result Date: 04/10/2023 CLINICAL DATA:  Surgery.  Closed reduction of the hip. EXAM: DG HIP (WITH OR WITHOUT PELVIS) 2-3V LEFT COMPARISON:  04/10/2023 FINDINGS: Intraoperative fluoroscopy is utilized for surgical control purposes. Fluoroscopy time recorded at 7 seconds. Dose 1.25 mGy. Two spot fluoroscopic images are  provided. Spot fluoroscopic images demonstrate anatomic position of the left hip arthroplasty postreduction. Steep acetabular angle. IMPRESSION: Intraoperative fluoroscopy is utilized for surgical control purposes demonstrating reduction of left hip arthroplasty. Electronically Signed   By: Burman Nieves M.D.   On: 04/10/2023 18:24   DG C-Arm 1-60 Min-No Report Result Date: 04/10/2023 Fluoroscopy was utilized by the requesting physician.  No radiographic interpretation.   DG HIP PORT UNILAT WITH PELVIS 1V LEFT Result Date: 04/10/2023 CLINICAL DATA:  Left hip reduction of dislocation. EXAM: DG HIP (WITH OR WITHOUT PELVIS) 1V PORT LEFT COMPARISON:  Left hip x-ray 04/10/2023 FINDINGS: Left hip arthroplasty is again seen. The femoral head component appears dislocated/subluxed superiorly in relation to the acetabular cup. There is no acute fracture or hardware loosening identified. Right hip arthroplasty appears in anatomic alignment. There is fusion hardware of the lower lumbar spine. IMPRESSION: Superior dislocation/subluxation of the left femoral head component in relation to the acetabular cup. Electronically Signed   By: Mcneil Sober.D.  On: 04/10/2023 16:02   DG Hip Unilat W or Wo Pelvis 2-3 Views Left Result Date: 04/10/2023 CLINICAL DATA:  Fall and left hip pain. EXAM: DG HIP (WITH OR WITHOUT PELVIS) 2-3V LEFT COMPARISON:  None Available. FINDINGS: Total bilateral hip arthroplasty. There is superior subluxation of the femoral component of the left hip arthroplasty in relation to the acetabular cup. No definite acute fracture. The bones are osteopenic which limits evaluation for fracture. Degenerative changes of the lower lumbar spine and fusion hardware. The soft tissues are unremarkable. IMPRESSION: Superior subluxation of the femoral component of the left hip arthroplasty. Electronically Signed   By: Elgie Collard M.D.   On: 04/10/2023 14:33   DG Chest Port 1 View Result Date:  04/10/2023 CLINICAL DATA:  Left hip dislocation after fall last week. EXAM: PORTABLE CHEST 1 VIEW COMPARISON:  None Available. FINDINGS: The heart size and mediastinal contours are within normal limits. Both lungs are clear. The visualized skeletal structures are unremarkable. IMPRESSION: No active disease. Electronically Signed   By: Lupita Raider M.D.   On: 04/10/2023 14:29    Microbiology: Results for orders placed or performed during the hospital encounter of 04/10/23  Culture, Fungus without Smear     Status: None (Preliminary result)   Collection Time: 04/12/23  1:03 PM   Specimen: PATH Cytology Misc. fluid; Synovial Fluid  Result Value Ref Range Status   Specimen Description SYNOVIAL  Final   Special Requests NONE  Final   Culture   Final    NO FUNGUS ISOLATED AFTER 6 DAYS Performed at [facility], [address], [place], [place] [address]    Report Status PENDING  Incomplete  Body fluid culture w Gram Stain     Status: None   Collection Time: 04/12/23  1:03 PM   Specimen: PATH Cytology Misc. fluid; Synovial Fluid  Result Value Ref Range Status   Specimen Description SYNOVIAL  Final   Special Requests NONE  Final   Gram Stain NO WBC SEEN NO ORGANISMS SEEN   Final   Culture   Final    NO GROWTH 3 DAYS Performed at [facility], [address], [place], [place] [address]    Report Status 04/15/2023 FINAL  Final  Surgical pcr screen     Status: None   Collection Time: 04/13/23  4:50 PM   Specimen: Nasal Mucosa; Nasal Swab  Result Value Ref Range Status   MRSA, PCR NEGATIVE NEGATIVE Final   Staphylococcus aureus NEGATIVE NEGATIVE Final    Comment: (NOTE) The Xpert SA Assay (FDA approved for NASAL specimens in patients 20 years of age and older), is one component of a comprehensive surveillance program. It is not intended to diagnose infection nor to guide or monitor treatment. Performed at [facility], [address] [place],  [place] [address]   Aerobic/Anaerobic Culture w Gram Stain (surgical/deep wound)     Status: None (Preliminary result)   Collection Time: 04/14/23 12:52 PM   Specimen: PATH Soft tissue  Result Value Ref Range Status   Specimen Description TISSUE  Final   Special Requests HIP  Final   Gram Stain NO WBC SEEN NO ORGANISMS SEEN   Final   Culture   Final    NO GROWTH 4 DAYS NO ANAEROBES ISOLATED; CULTURE IN PROGRESS FOR 5 DAYS Performed at [facility], [address], [place], [place] [address]    Report Status PENDING  Incomplete  Aerobic/Anaerobic Culture w Gram  Stain (surgical/deep wound)     Status: None (Preliminary result)   Collection Time: 04/14/23 12:53 PM   Specimen: PATH Soft tissue  Result Value Ref Range Status   Specimen Description   Final    TISSUE Performed at [facility], [address], [place], [place] [address]    Special Requests   Final    NONE Performed at [facility], [address], [place], [place] [address]    Gram Stain NO WBC SEEN NO ORGANISMS SEEN   Final   Culture   Final    NO GROWTH 4 DAYS NO ANAEROBES ISOLATED; CULTURE IN PROGRESS FOR 5 DAYS Performed at [facility], [address], [place], [place] [address]    Report Status PENDING  Incomplete  Aerobic/Anaerobic Culture w Gram Stain (surgical/deep wound)     Status: None (Preliminary result)   Collection Time: 04/14/23 12:53 PM   Specimen: PATH Soft tissue  Result Value Ref Range Status   Specimen Description   Final    TISSUE Performed at [facility], [address], [place], [place] [address]    Special Requests   Final    NONE Performed at [facility], [address] [place], [place] [address]    Gram Stain NO WBC SEEN NO ORGANISMS SEEN   Final   Culture   Final    NO GROWTH 4 DAYS NO ANAEROBES ISOLATED; CULTURE IN PROGRESS FOR 5 DAYS Performed at [facility], [address] [place], [place] [address]    Report  Status PENDING  Incomplete    Labs: CBC: Recent Labs  Lab 04/14/23 0312 04/14/23 1613 04/15/23 0306 04/15/23 1709 04/16/23 0320 04/16/23 1619 04/17/23 0320 04/17/23 1201 04/18/23 0316  WBC 6.4  --  11.9*  --  11.5*  --  11.6*  --  10.4  HGB 8.9*   < > 6.4*   < > 7.0* 8.0* 7.4* 7.5* 7.3*  HCT 28.6*   < > 20.8*   < > 21.4* 25.0* 23.7* 23.7* 23.7*  MCV 93.2  --  94.5  --  91.1  --  94.0  --  96.0  PLT 156  --  146*  --  134*  --  136*  --  155   < > = values in this interval not displayed.   Basic Metabolic Panel: Recent Labs  Lab 04/14/23 0312 04/15/23 0306 04/16/23 0320 04/17/23 0320 04/18/23 0316  NA 138 134* 131* 136 138  K 3.7 4.6 4.1 3.4* 4.2  CL 108 103 101 105 106  CO2 24 22 22 23 25   GLUCOSE 129* 196* 122* 123* 96  BUN 17 22 19 15 13   CREATININE 1.14* 0.92 0.84 0.74 0.72  CALCIUM 9.9 9.9 9.4 9.7 9.9  MG  --   --   --   --  1.6*   Liver Function Tests: Recent Labs  Lab 04/13/23 0827  AST 11*  ALT 10  ALKPHOS 73  BILITOT 0.5  PROT 5.5*  ALBUMIN 3.0*   CBG: Recent Labs  Lab 04/17/23 1156 04/17/23 1650 04/17/23 2144 04/18/23 0758 04/18/23 1142  GLUCAP 155* 116* 240* 119* 181*    Discharge time spent: greater than 30 minutes.  Signed: Emeline General, MD [facility] 04/18/2023

## 2023-04-18 NOTE — Progress Notes (Addendum)
    4 Days Post-Op Procedure(s) (LRB): TOTAL HIP REVISION (Left)  Subjective:  Patient reports pain as mild, doing better each day. Slept well overnight, no overnight events.  Acute post-operative anemia.  Transfused 1 unit 04/16/23.  Hgb 7.3 this AM.  Told if her Hgb stabilizes may be able to discharge soon.  Denies SOB, chest pain, or dizziness.  Mobilized further with PT yesterday, ambulated 20 ft.  Objective:   VITALS:   Vitals:   04/17/23 0824 04/17/23 1340 04/17/23 2145 04/18/23 0515  BP: (!) 125/56 120/64 (!) 120/53 (!) 109/57  Pulse: 83 96 (!) 103 84  Resp: 16 17 16 15   Temp: 98.2 F (36.8 C) 98 F (36.7 C) 99.1 F (37.3 C) 98.4 F (36.9 C)  TempSrc:   Oral   SpO2: 90% 96% 97% 95%  Weight:      Height:        AAOx4, in NAD Neurovascular intact Sensation intact distally Intact pulses distally Dorsiflexion/Plantar flexion intact Incision: dressing C/D/I Compartment soft Woundvac holding suction, no collection in cannister Wiggles toes appropriately   Lab Results  Component Value Date   WBC 10.4 04/18/2023   HGB 7.3 (L) 04/18/2023   HCT 23.7 (L) 04/18/2023   MCV 96.0 04/18/2023   PLT 155 04/18/2023   BMET    Component Value Date/Time   NA 138 04/18/2023 0316   K 4.2 04/18/2023 0316   CL 106 04/18/2023 0316   CO2 25 04/18/2023 0316   GLUCOSE 96 04/18/2023 0316   BUN 13 04/18/2023 0316   CREATININE 0.72 04/18/2023 0316   CALCIUM 9.9 04/18/2023 0316   GFRNONAA >60 04/18/2023 0316      Xray: THA components in good position no advere features  Assessment/Plan: 4 Days Post-Op   Principal Problem:   Closed dislocation of left hip, initial encounter (HCC) Active Problems:   Essential hypertension   Gout   HLD (hyperlipidemia)   Primary hyperparathyroidism (HCC)   T2DM (type 2 diabetes mellitus) (HCC)  S/p L THA revision 04/14/23   Post op recs: WB: 50% PWB LLE, posterior hip precautions x 6 weeks Abx: ancef Imaging: PACU pelvis  Xray Dressing: Woundvac, keep intact until follow up DVT prophylaxis: Aspirin 81BID starting POD1 Follow up: 1 week after surgery for a dressing change and wound check with Dr. Blanchie Dessert at [facility].  Address: [address], [place], [place] [address]  Office Phone: [phone]   Cecil Cobbs 04/18/2023, 6:54 AM    Contact information:   Weekdays 7am-5pm epic message Dr. Blanchie Dessert, or call office for patient follow up: [phone] After hours and holidays please check [email or web address] for group call information for [facility]

## 2023-04-18 NOTE — Plan of Care (Signed)

## 2023-04-18 NOTE — Progress Notes (Signed)
Physical Therapy Treatment Patient Details Name: Cynthia Torres MRN: [id number] DOB: 12-Apr-1945 Today's Date: 04/18/2023   History of Present Illness 78 yo female presents to therapy s/p L THA revision on 04/14/2023 with aseptic loosening of the acetabular component. Pt sustained a fall 1 wk ago and underwent closed reduction of L hip on 2/6 which subsequently dislocated in recovery room. Pt is now s/p L THA revision to dual mobility construct on 04/14/23, posterior hip precautions x 6 wks.  PMH includes but is not limited to: HTN, gout, HLD, hyperparathyroidism, lumbar laminectomy (02/2023), AKI, anemia, L THA, and DM II.    PT Comments  POD # 4 am session AxO x 3 pleasant Lady.  Assisted OOB to amb in room required increased time and + 2 assist for safety.  General Gait Details: cues for sequence, PWB, trunk extension, use of UEs.  No c/o dizziness.  Noted HgB 7.3 Then returned to room to perform some TE's following HEP handout.  Instructed on proper tech, freq as well as use of ICE.   Pt plans to D/C to SNF for ST Rehab.    If plan is discharge home, recommend the following:     Can travel by private vehicle     No  Equipment Recommendations  None recommended by PT    Recommendations for Other Services       Precautions / Restrictions Precautions Precautions: Fall;Posterior Hip Precaution/Restrictions Comments: incisional VAC Restrictions Weight Bearing Restrictions Per Provider Order: Yes LLE Weight Bearing Per Provider Order: Partial weight bearing LLE Partial Weight Bearing Percentage or Pounds: 50% Other Position/Activity Restrictions: posterior hip precautions     Mobility  Bed Mobility Overal bed mobility: Needs Assistance Bed Mobility: Supine to Sit     Supine to sit: Mod assist, +2 for physical assistance, +2 for safety/equipment, Used rails     General bed mobility comments: bed pad used to assist lateral scoot, assist to progress LEs off bed, elevate trunk and  maintain THP, improved pt effort and ability to self assist trunk to upright    Transfers Overall transfer level: Needs assistance Equipment used: Rolling walker (2 wheels) Transfers: Sit to/from Stand Sit to Stand: Min assist, +2 safety/equipment           General transfer comment: cues for hand placement and RLE position/THP    Ambulation/Gait Ambulation/Gait assistance: Min assist, Mod assist, +2 safety/equipment Gait Distance (Feet): 16 Feet Assistive device: Rolling walker (2 wheels) Gait Pattern/deviations: Step-to pattern, Trunk flexed Gait velocity: decreased     General Gait Details: cues for sequence, PWB, trunk extension, use of UEs.  No c/o dizziness.  Noted HgB 7.3   Stairs             Wheelchair Mobility     Tilt Bed    Modified Rankin (Stroke Patients Only)       Balance                                            Communication Communication Communication: No apparent difficulties  Cognition Arousal: Alert Behavior During Therapy: WFL for tasks assessed/performed   PT - Cognitive impairments: No apparent impairments                       PT - Cognition Comments: AxO x 3 pleasant Lady.  Required ReEducation on her 2/3 THP  and 50% WBing Following commands: Intact      Cueing Cueing Techniques: Verbal cues  Exercises  Total Hip Replacement TE's following HEP Handout 10 reps ankle pumps 05 reps knee presses 05 reps heel slides 05 reps SAQ's 05 reps ABD Instructed how to use a belt loop to assist  Followed by ICE     General Comments        Pertinent Vitals/Pain Pain Assessment Pain Assessment: 0-10 Faces Pain Scale: Hurts little more Pain Location: L hip Pain Descriptors / Indicators: Discomfort, Guarding, Grimacing Pain Intervention(s): Monitored during session, Premedicated before session, Repositioned, Ice applied    Home Living                          Prior Function             PT Goals (current goals can now be found in the care plan section) Progress towards PT goals: Progressing toward goals    Frequency    7X/week      PT Plan      Co-evaluation              AM-PAC PT "6 Clicks" Mobility   Outcome Measure  Help needed turning from your back to your side while in a flat bed without using bedrails?: A Lot Help needed moving from lying on your back to sitting on the side of a flat bed without using bedrails?: A Lot Help needed moving to and from a bed to a chair (including a wheelchair)?: A Lot Help needed standing up from a chair using your arms (e.g., wheelchair or bedside chair)?: A Lot Help needed to walk in hospital room?: A Lot Help needed climbing 3-5 steps with a railing? : Total 6 Click Score: 11    End of Session Equipment Utilized During Treatment: Gait belt Activity Tolerance: Patient limited by fatigue;Patient tolerated treatment well Patient left: in chair;with call bell/phone within reach;with chair alarm set;with family/visitor present Nurse Communication: Mobility status PT Visit Diagnosis: Other abnormalities of gait and mobility (R26.89)     Time: 6213-0865 PT Time Calculation (min) (ACUTE ONLY): 25 min  Charges:    $Gait Training: 8-22 mins $Therapeutic Exercise: 8-22 mins PT General Charges $$ ACUTE PT VISIT: 1 Visit                     Felecia Shelling  PTA Acute  Rehabilitation Services Office M-F          [phone]

## 2023-04-18 NOTE — Plan of Care (Signed)

## 2023-04-19 LAB — AEROBIC/ANAEROBIC CULTURE W GRAM STAIN (SURGICAL/DEEP WOUND)
Culture: NO GROWTH
Culture: NO GROWTH
Culture: NO GROWTH
Gram Stain: NONE SEEN
Gram Stain: NONE SEEN
Gram Stain: NONE SEEN

## 2023-04-24 DIAGNOSIS — T84031D Mechanical loosening of internal left hip prosthetic joint, subsequent encounter: Secondary | ICD-10-CM | POA: Diagnosis not present

## 2023-05-03 LAB — CULTURE, FUNGUS WITHOUT SMEAR

## 2023-05-06 DIAGNOSIS — T84031D Mechanical loosening of internal left hip prosthetic joint, subsequent encounter: Secondary | ICD-10-CM | POA: Diagnosis not present

## 2023-05-08 DIAGNOSIS — R600 Localized edema: Secondary | ICD-10-CM | POA: Diagnosis not present

## 2023-05-12 ENCOUNTER — Encounter: Payer: Self-pay | Admitting: Podiatry

## 2023-05-12 ENCOUNTER — Ambulatory Visit

## 2023-05-12 ENCOUNTER — Ambulatory Visit (INDEPENDENT_AMBULATORY_CARE_PROVIDER_SITE_OTHER): Admitting: Podiatry

## 2023-05-12 VITALS — Ht 59.0 in | Wt 165.0 lb

## 2023-05-12 DIAGNOSIS — M25571 Pain in right ankle and joints of right foot: Secondary | ICD-10-CM

## 2023-05-12 DIAGNOSIS — M1A4720 Other secondary chronic gout, left ankle and foot, without tophus (tophi): Secondary | ICD-10-CM | POA: Diagnosis not present

## 2023-05-12 NOTE — Progress Notes (Signed)
  Subjective:  Patient ID: Cynthia Torres, female    DOB: Mar 12, 1945,  MRN: [id number]  Chief Complaint  Patient presents with   Toe Pain    Pt is here for bilateral foot and ankle pain, she states she recently had back surgery on December 18 came home a few weeks later, fell on left side hip pop out of place and had hip surgery due to that states since then both legs ankles and feet has been swollen and in pain, since PCP for this issue and was told she had fluid in her legs ankle and feet was told to increase fluid pill and state that has not help at all, states left foot is cold to touch and aches more than the right.    78 y.o. female presents with the above complaint. History confirmed with patient.   Objective:  Physical Exam: warm, good capillary refill, no trophic changes or ulcerative lesions, normal DP and PT pulses, and bilaterally she has +1 edema, does not appear to be significantly worse on the left side, pain and tenderness around the ankle and subtalar joint, she does have some evidence of neuropathy, Homan or Pratt sign  Assessment:   1. Acute right ankle pain   2. 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  Suspect she is possibly dealing with an acute gout flare on chronic disease with recent surgery.  I recommended evaluating for lab work including ESR CBC and uric acid.  She had a negative Homan and Pratt sign but she does have an ultrasound scheduled for next week and a encouraged her to keep this doubtful that DVT as cause of her pain and swelling.  I will let her know what the lab work shows and we will treat as needed  No follow-ups on file.

## 2023-05-13 DIAGNOSIS — E785 Hyperlipidemia, unspecified: Secondary | ICD-10-CM | POA: Diagnosis not present

## 2023-05-13 DIAGNOSIS — E1151 Type 2 diabetes mellitus with diabetic peripheral angiopathy without gangrene: Secondary | ICD-10-CM | POA: Diagnosis not present

## 2023-05-13 LAB — CBC WITH DIFFERENTIAL/PLATELET
Basophils Absolute: 0.1 10*3/uL (ref 0.0–0.2)
Basos: 1 %
EOS (ABSOLUTE): 0.5 10*3/uL — ABNORMAL HIGH (ref 0.0–0.4)
Eos: 6 %
Hematocrit: 33.6 % — ABNORMAL LOW (ref 34.0–46.6)
Hemoglobin: 10.4 g/dL — ABNORMAL LOW (ref 11.1–15.9)
Immature Grans (Abs): 0 10*3/uL (ref 0.0–0.1)
Immature Granulocytes: 0 %
Lymphocytes Absolute: 1.3 10*3/uL (ref 0.7–3.1)
Lymphs: 16 %
MCH: 28.6 pg (ref 26.6–33.0)
MCHC: 31 g/dL — ABNORMAL LOW (ref 31.5–35.7)
MCV: 92 fL (ref 79–97)
Monocytes Absolute: 0.5 10*3/uL (ref 0.1–0.9)
Monocytes: 6 %
Neutrophils Absolute: 5.7 10*3/uL (ref 1.4–7.0)
Neutrophils: 71 %
Platelets: 275 10*3/uL (ref 150–450)
RBC: 3.64 x10E6/uL — ABNORMAL LOW (ref 3.77–5.28)
RDW: 13.5 % (ref 11.7–15.4)
WBC: 8 10*3/uL (ref 3.4–10.8)

## 2023-05-13 LAB — BASIC METABOLIC PANEL
BUN/Creatinine Ratio: 20 (ref 12–28)
BUN: 13 mg/dL (ref 8–27)
CO2: 22 mmol/L (ref 20–29)
Calcium: 11.6 mg/dL — ABNORMAL HIGH (ref 8.7–10.3)
Chloride: 107 mmol/L — ABNORMAL HIGH (ref 96–106)
Creatinine, Ser: 0.64 mg/dL (ref 0.57–1.00)
Glucose: 81 mg/dL (ref 70–99)
Potassium: 5 mmol/L (ref 3.5–5.2)
Sodium: 142 mmol/L (ref 134–144)
eGFR: 91 mL/min/{1.73_m2} (ref >59)

## 2023-05-13 LAB — URIC ACID: Uric Acid: 3 mg/dL — ABNORMAL LOW (ref 3.1–7.9)

## 2023-05-13 LAB — SEDIMENTATION RATE: Sed Rate: 9 mm/h (ref 0–40)

## 2023-05-19 DIAGNOSIS — T84021D Dislocation of internal left hip prosthesis, subsequent encounter: Secondary | ICD-10-CM | POA: Diagnosis not present

## 2023-05-19 DIAGNOSIS — I10 Essential (primary) hypertension: Secondary | ICD-10-CM | POA: Diagnosis not present

## 2023-05-19 DIAGNOSIS — M109 Gout, unspecified: Secondary | ICD-10-CM | POA: Diagnosis not present

## 2023-05-19 DIAGNOSIS — E1151 Type 2 diabetes mellitus with diabetic peripheral angiopathy without gangrene: Secondary | ICD-10-CM | POA: Diagnosis not present

## 2023-05-19 DIAGNOSIS — T8131XD Disruption of external operation (surgical) wound, not elsewhere classified, subsequent encounter: Secondary | ICD-10-CM | POA: Diagnosis not present

## 2023-05-20 ENCOUNTER — Emergency Department (HOSPITAL_BASED_OUTPATIENT_CLINIC_OR_DEPARTMENT_OTHER)

## 2023-05-20 ENCOUNTER — Emergency Department (HOSPITAL_COMMUNITY)

## 2023-05-20 ENCOUNTER — Emergency Department (HOSPITAL_COMMUNITY)
Admission: EM | Admit: 2023-05-20 | Discharge: 2023-05-20 | Disposition: A | Discharge status: Home / Self Care | Attending: Emergency Medicine | Admitting: Emergency Medicine

## 2023-05-20 ENCOUNTER — Encounter (HOSPITAL_COMMUNITY): Payer: Self-pay | Admitting: Emergency Medicine

## 2023-05-20 DIAGNOSIS — M109 Gout, unspecified: Secondary | ICD-10-CM | POA: Diagnosis not present

## 2023-05-20 DIAGNOSIS — T84021D Dislocation of internal left hip prosthesis, subsequent encounter: Secondary | ICD-10-CM | POA: Diagnosis not present

## 2023-05-20 DIAGNOSIS — E1151 Type 2 diabetes mellitus with diabetic peripheral angiopathy without gangrene: Secondary | ICD-10-CM | POA: Diagnosis not present

## 2023-05-20 DIAGNOSIS — M7989 Other specified soft tissue disorders: Secondary | ICD-10-CM | POA: Diagnosis not present

## 2023-05-20 DIAGNOSIS — I2510 Atherosclerotic heart disease of native coronary artery without angina pectoris: Secondary | ICD-10-CM | POA: Diagnosis not present

## 2023-05-20 DIAGNOSIS — M79662 Pain in left lower leg: Secondary | ICD-10-CM | POA: Insufficient documentation

## 2023-05-20 DIAGNOSIS — R0602 Shortness of breath: Secondary | ICD-10-CM | POA: Diagnosis not present

## 2023-05-20 DIAGNOSIS — I517 Cardiomegaly: Secondary | ICD-10-CM | POA: Diagnosis not present

## 2023-05-20 DIAGNOSIS — Z7982 Long term (current) use of aspirin: Secondary | ICD-10-CM | POA: Diagnosis not present

## 2023-05-20 DIAGNOSIS — I10 Essential (primary) hypertension: Secondary | ICD-10-CM | POA: Diagnosis not present

## 2023-05-20 DIAGNOSIS — I82412 Acute embolism and thrombosis of left femoral vein: Secondary | ICD-10-CM | POA: Diagnosis not present

## 2023-05-20 DIAGNOSIS — I824Y2 Acute embolism and thrombosis of unspecified deep veins of left proximal lower extremity: Secondary | ICD-10-CM

## 2023-05-20 DIAGNOSIS — R0989 Other specified symptoms and signs involving the circulatory and respiratory systems: Secondary | ICD-10-CM | POA: Diagnosis not present

## 2023-05-20 DIAGNOSIS — R600 Localized edema: Secondary | ICD-10-CM | POA: Diagnosis not present

## 2023-05-20 DIAGNOSIS — T8131XD Disruption of external operation (surgical) wound, not elsewhere classified, subsequent encounter: Secondary | ICD-10-CM | POA: Diagnosis not present

## 2023-05-20 DIAGNOSIS — R609 Edema, unspecified: Secondary | ICD-10-CM | POA: Diagnosis not present

## 2023-05-20 LAB — COMPREHENSIVE METABOLIC PANEL
ALT: 9 U/L (ref 0–44)
AST: 13 U/L — ABNORMAL LOW (ref 15–41)
Albumin: 3.8 g/dL (ref 3.5–5.0)
Alkaline Phosphatase: 94 U/L (ref 38–126)
Anion gap: 9 (ref 5–15)
BUN: 14 mg/dL (ref 8–23)
CO2: 25 mmol/L (ref 22–32)
Calcium: 11 mg/dL — ABNORMAL HIGH (ref 8.9–10.3)
Chloride: 101 mmol/L (ref 98–111)
Creatinine, Ser: 0.74 mg/dL (ref 0.44–1.00)
GFR, Estimated: >60 mL/min (ref >60)
Glucose, Bld: 79 mg/dL (ref 70–99)
Potassium: 3.8 mmol/L (ref 3.5–5.1)
Sodium: 135 mmol/L (ref 135–145)
Total Bilirubin: 0.7 mg/dL (ref 0.0–1.2)
Total Protein: 7.1 g/dL (ref 6.5–8.1)

## 2023-05-20 LAB — RESP PANEL BY RT-PCR (RSV, FLU A&B, COVID)  RVPGX2
Influenza A by PCR: NEGATIVE
Influenza B by PCR: NEGATIVE
Resp Syncytial Virus by PCR: NEGATIVE
SARS Coronavirus 2 by RT PCR: NEGATIVE

## 2023-05-20 LAB — CBC
HCT: 37.3 % (ref 36.0–46.0)
Hemoglobin: 11.3 g/dL — ABNORMAL LOW (ref 12.0–15.0)
MCH: 28.8 pg (ref 26.0–34.0)
MCHC: 30.3 g/dL (ref 30.0–36.0)
MCV: 94.9 fL (ref 80.0–100.0)
Platelets: 254 10*3/uL (ref 150–400)
RBC: 3.93 MIL/uL (ref 3.87–5.11)
RDW: 13.6 % (ref 11.5–15.5)
WBC: 7.1 10*3/uL (ref 4.0–10.5)
nRBC: 0 % (ref 0.0–0.2)

## 2023-05-20 LAB — URIC ACID: Uric Acid, Serum: 3.6 mg/dL (ref 2.5–7.1)

## 2023-05-20 LAB — SEDIMENTATION RATE: Sed Rate: 30 mm/h — ABNORMAL HIGH (ref 0–22)

## 2023-05-20 LAB — C-REACTIVE PROTEIN: CRP: 0.6 mg/dL (ref <1.0)

## 2023-05-20 MED ORDER — IOHEXOL 350 MG/ML SOLN
75.0000 mL | Freq: Once | INTRAVENOUS | Status: AC | PRN
  Administered 2023-05-20: 75 mL via INTRAVENOUS

## 2023-05-20 MED ORDER — APIXABAN (ELIQUIS) EDUCATION KIT FOR DVT/PE PATIENTS
PACK | Freq: Once | Status: AC
  Filled 2023-05-20: qty 1

## 2023-05-20 MED ORDER — APIXABAN (ELIQUIS) VTE STARTER PACK (10MG AND 5MG): ORAL_TABLET | ORAL | 0 refills | Status: DC

## 2023-05-20 MED ORDER — APIXABAN 5 MG PO TABS
10.0000 mg | ORAL_TABLET | Freq: Once | ORAL | Status: AC
  Administered 2023-05-20: 10 mg via ORAL
  Filled 2023-05-20: qty 2

## 2023-05-20 NOTE — ED Triage Notes (Signed)
 Patient comes in via ems from [place] - possible dvts in left leg from [facility]. Swelling noted to both feet more swelling to left foot  Shortness of breath within the last week but none at this time.

## 2023-05-20 NOTE — ED Provider Notes (Signed)
 [place] EM[facility]GENCY DEPARTMENT AT [facility] Provider Note   CSN: [id number] Arrival date & time: 05/20/23  1734     History  No chief complaint on file.   Cynthia Torres is a 78 y.o. female who presents emergency department for evaluation of leg pain and swelling.  Patient had a recent hip replacement in mid February of this year.  She has been having chronic edema in her right leg and swelling in the left leg since the surgery but the swelling has been progressively worsening.  She also has pain in her foot and has seen podiatry in the past.  Patient reports that sensation of heaviness in her leg and has been having intermittent shortness of breath as well.  She was seen by her PCP today and had an outpatient ultrasound that showed an occlusive thrombus of the left posterior tibial popliteal and femoral veins.  She was sent in for further evaluation of her shortness of breath.  HPI     Home Medications Prior to Admission medications   Medication Sig Start Date End Date Taking? Authorizing Provider  allopurinol (ZYLOPRIM) 300 MG tablet Take 300 mg by mouth daily. 03/05/18   [provider]  amLODipine (NORVASC) 5 MG tablet Take 5 mg by mouth daily.    [provider]  aspirin EC 81 MG tablet Take 1 tablet (81 mg total) by mouth 2 (two) times daily. Swallow whole. 04/18/23   Emeline General, MD  atenolol (TENORMIN) 100 MG tablet Take 100 mg by mouth daily.    [provider]  cefadroxil (DURICEF) 500 MG capsule Take 1 capsule (500 mg total) by mouth 2 (two) times daily. 04/18/23   Emeline General, MD  docusate sodium (COLACE) 100 MG capsule Take 1 capsule (100 mg total) by mouth 2 (two) times daily. 04/18/23   Emeline General, MD  furosemide (LASIX) 20 MG tablet Take 20 mg by mouth daily. 11/15/16   [provider]  gabapentin (NEURONTIN) 100 MG capsule Take 1 capsule (100 mg total) by mouth every 12 (twelve) hours as needed (As needed for moderate  to severe pain). 04/18/23   Emeline General, MD  lisinopril-hydrochlorothiazide (PRINZIDE,ZESTORETIC) 20-25 MG tablet Take 1 tablet by mouth daily. 03/17/18   [provider]  meclizine (ANTIVERT) 25 MG tablet Take 1 tablet (25 mg total) by mouth 3 (three) times daily as needed for dizziness. 10/21/22   Wynetta Fines, MD  metFORMIN (GLUCOPHAGE) 500 MG tablet Take 1,000 mg by mouth 2 (two) times daily with a meal. 1,000mg  in AM and 500mg  in PM    [provider]  methocarbamol (ROBAXIN) 500 MG tablet Take 1 tablet (500 mg total) by mouth every 6 (six) hours as needed for muscle spasms. 04/18/23   Emeline General, MD  ondansetron (ZOFRAN) 4 MG tablet Take 1 tablet (4 mg total) by mouth every 6 (six) hours. Patient taking differently: Take 4 mg by mouth every 8 (eight) hours as needed for nausea or vomiting. 10/21/22   Wynetta Fines, MD  oxyCODONE 10 MG TABS Take 1-1.5 tablets (10-15 mg total) by mouth every 4 (four) hours as needed for moderate pain (pain score 4-6) (pain score 4-6). 04/18/23   Emeline General, MD  potassium chloride (MICRO-K) 10 MEQ CR capsule Take 10 mEq by mouth 2 (two) times daily. 01/20/20   [provider]  rosuvastatin (CRESTOR) 10 MG tablet Take 5 mg by mouth once. 05/04/20  [provider]  Vitamin D, Ergocalciferol, (DRISDOL) 1.25 MG (50000 UNIT) CAPS capsule Take 50,000 Units by mouth once a week. 04/03/23   [provider]      Allergies    Atorvastatin    Review of Systems   Review of Systems  Physical Exam Updated Vital Signs BP (!) 178/78 (BP Location: Right Arm)   Pulse 65   Temp 97.8 F (36.6 C) (Oral)   Resp 16   Ht 4\' 11"  (1.499 m)   Wt 75.8 kg   SpO2 100%   BMI 33.73 kg/m  Physical Exam Vitals and nursing note reviewed.  Constitutional:      General: She is not in acute distress.    Appearance: She is well-developed. She is not diaphoretic.  HENT:     Head: Normocephalic and atraumatic.     Right Ear:  External ear normal.     Left Ear: External ear normal.     Nose: Nose normal.     Mouth/Throat:     Mouth: Mucous membranes are moist.  Eyes:     General: No scleral icterus.    Conjunctiva/sclera: Conjunctivae normal.  Cardiovascular:     Rate and Rhythm: Normal rate and regular rhythm.     Heart sounds: Normal heart sounds. No murmur heard.    No friction rub. No gallop.  Pulmonary:     Effort: Pulmonary effort is normal. No respiratory distress.     Breath sounds: Normal breath sounds.  Abdominal:     General: Bowel sounds are normal. There is no distension.     Palpations: Abdomen is soft. There is no mass.     Tenderness: There is no abdominal tenderness. There is no guarding.  Musculoskeletal:     Cervical back: Normal range of motion.     Right lower leg: Edema present.     Left lower leg: Edema present.     Comments: Pitting edema bilateral lower extremities left greater than right.  Skin:    General: Skin is warm and dry.  Neurological:     Mental Status: She is alert and oriented to person, place, and time.  Psychiatric:        Behavior: Behavior normal.     ED Results / Procedures / Treatments   Labs (all labs ordered are listed, but only abnormal results are displayed) Labs Reviewed - No data to display  EKG None  Radiology No results found.  Procedures Procedures    Medications Ordered in ED Medications - No data to display  ED Course/ Medical Decision Making/ A&P Clinical Course as of 05/23/23 1457  Tue May 20, 2023  1814 I got a call from Ruby from the Radiolgy Reading room. OP DVT US today showed occlusive thrombus of the left popliteal, femoral and post tibial [AH]  2052 Creatinine: 0.74 [AH]  2052 Hemoglobin(!): 11.3 [AH]    Clinical Course User Index [AH] Arthor Captain, PA-C                                 Medical Decision Making Amount and/or Complexity of Data Reviewed Labs: ordered. Decision-making details documented in ED  Course. Radiology: ordered.  Risk Prescription drug management.   This patient presents to the ED with chief complaint(s) of shortness of breath with pertinent past medical history of known DVT of the left lower extremity, recent surgery which further complicates the presenting complaint. The complaint involves  an extensive differential diagnosis and treatment options and also carries with it a high risk of complications and morbidity.    The emergent differential diagnosis for shortness of breath includes, but is not limited to, Pulmonary edema, bronchoconstriction, Pneumonia, Pulmonary embolism, Pneumotherax/ Hemothorax, Dysrythmia, ACS.     The initial plan is to order labs.  Review of EMR shows concern for potential acute on chronic gout from podiatrist added sed rate CRP and uric acid along with workup for shortness of breath.  Chest x-ray and CT angiogram.       Additional history obtained: Additional history obtained from family Records reviewed Care Everywhere/External Records  Reassessment and review (also see workup area): Lab Tests: I Ordered, and personally interpreted labs.  The pertinent results include:    There are no significant findings to this emergent work up   Imaging Studies I ordered and independently visualized and interpreted the following imaging CT scan chest pe study and X-ray chest   which showed no acute findings The interpretation of the imaging was limited to assessing for emergent pathology, for which purpose it was ordered.  Consultations Obtained: N/a  Medicines ordered and prescription drug management: I ordered the following medications Eliquis for DVT treatment    Cardiac Monitoring: The patient was maintained on a cardiac monitor.  I personally viewed and interpreted the cardiac monitor which showed an underlying rhythm of:  sinus rhythm  Complexity of problems addressed: Patient's presentation is most consistent with  acute complicated  illness/injury requiring diagnostic workup During patient's assessment  Disposition: After consideration of the diagnostic results and the patient's response to treatment,  I feel that the patent would benefit from discharge on eliquis with referral to DVT clinic .          Final Clinical Impression(s) / ED Diagnoses Final diagnoses:  None    Rx / DC Orders ED Discharge Orders     None         Arthor Captain, PA-C 05/23/23 1504    Lorre Nick, MD 05/25/23 2232

## 2023-05-20 NOTE — Progress Notes (Signed)
 Left lower extremity venous duplex has been completed.  Results can be found in chart review under CV Proc.  05/20/2023 7:31 PM  Fernande Bras, RVT.

## 2023-05-20 NOTE — Discharge Instructions (Addendum)
 Do not take any drugs like aspirin Advil or Aleve while taking this medication for treatment of your DVT.  It help right away if you notice black stools, lightheadedness, increasing chest pain and shortness of breath.  Closely with your primary care doctor and I have given you a referral to the DVT clinic for further workup.  Contact a health care provider if: You miss a dose of your blood thinner. You have unusual bruising or other color changes. You have new or worse pain, swelling, or redness in an arm or a leg. You have worsening numbness or tingling in an arm or a leg. You have a significant color change (pale or blue) in the extremity that has the DVT. Get help right away if: You have signs or symptoms that a blood clot has moved to the lungs. These may include: Shortness of breath. Chest pain. Fast or irregular heartbeats (palpitations). Light-headedness, dizziness, or fainting. Coughing up blood. You have signs or symptoms that your blood is too thin. These may include: Blood in your vomit, stool, or urine. A cut that will not stop bleeding. A severe headache or confusion. These symptoms may be an emergency. Get help right away. Call 911. Do not wait to see if the symptoms will go away. Do not drive yourself to the hospital.

## 2023-05-21 ENCOUNTER — Encounter: Payer: Self-pay | Admitting: Podiatry

## 2023-05-21 ENCOUNTER — Ambulatory Visit (INDEPENDENT_AMBULATORY_CARE_PROVIDER_SITE_OTHER): Payer: Medicare Other | Admitting: Podiatry

## 2023-05-21 DIAGNOSIS — M2012 Hallux valgus (acquired), left foot: Secondary | ICD-10-CM | POA: Diagnosis not present

## 2023-05-21 DIAGNOSIS — M79676 Pain in unspecified toe(s): Secondary | ICD-10-CM | POA: Diagnosis not present

## 2023-05-21 DIAGNOSIS — M2041 Other hammer toe(s) (acquired), right foot: Secondary | ICD-10-CM | POA: Diagnosis not present

## 2023-05-21 DIAGNOSIS — M2011 Hallux valgus (acquired), right foot: Secondary | ICD-10-CM | POA: Diagnosis not present

## 2023-05-21 DIAGNOSIS — L84 Corns and callosities: Secondary | ICD-10-CM

## 2023-05-21 DIAGNOSIS — M2042 Other hammer toe(s) (acquired), left foot: Secondary | ICD-10-CM | POA: Diagnosis not present

## 2023-05-21 DIAGNOSIS — B351 Tinea unguium: Secondary | ICD-10-CM | POA: Diagnosis not present

## 2023-05-21 DIAGNOSIS — E119 Type 2 diabetes mellitus without complications: Secondary | ICD-10-CM

## 2023-05-23 DIAGNOSIS — T84021D Dislocation of internal left hip prosthesis, subsequent encounter: Secondary | ICD-10-CM | POA: Diagnosis not present

## 2023-05-23 DIAGNOSIS — T8131XD Disruption of external operation (surgical) wound, not elsewhere classified, subsequent encounter: Secondary | ICD-10-CM | POA: Diagnosis not present

## 2023-05-23 DIAGNOSIS — M109 Gout, unspecified: Secondary | ICD-10-CM | POA: Diagnosis not present

## 2023-05-23 DIAGNOSIS — I10 Essential (primary) hypertension: Secondary | ICD-10-CM | POA: Diagnosis not present

## 2023-05-23 DIAGNOSIS — E1151 Type 2 diabetes mellitus with diabetic peripheral angiopathy without gangrene: Secondary | ICD-10-CM | POA: Diagnosis not present

## 2023-05-26 DIAGNOSIS — Z96642 Presence of left artificial hip joint: Secondary | ICD-10-CM | POA: Diagnosis not present

## 2023-05-26 DIAGNOSIS — T8131XD Disruption of external operation (surgical) wound, not elsewhere classified, subsequent encounter: Secondary | ICD-10-CM | POA: Diagnosis not present

## 2023-05-26 DIAGNOSIS — M109 Gout, unspecified: Secondary | ICD-10-CM | POA: Diagnosis not present

## 2023-05-26 DIAGNOSIS — I10 Essential (primary) hypertension: Secondary | ICD-10-CM | POA: Diagnosis not present

## 2023-05-26 DIAGNOSIS — K5909 Other constipation: Secondary | ICD-10-CM | POA: Diagnosis not present

## 2023-05-26 DIAGNOSIS — E1151 Type 2 diabetes mellitus with diabetic peripheral angiopathy without gangrene: Secondary | ICD-10-CM | POA: Diagnosis not present

## 2023-05-26 DIAGNOSIS — M79675 Pain in left toe(s): Secondary | ICD-10-CM | POA: Diagnosis not present

## 2023-05-26 DIAGNOSIS — I82402 Acute embolism and thrombosis of unspecified deep veins of left lower extremity: Secondary | ICD-10-CM | POA: Diagnosis not present

## 2023-05-26 DIAGNOSIS — T84021D Dislocation of internal left hip prosthesis, subsequent encounter: Secondary | ICD-10-CM | POA: Diagnosis not present

## 2023-05-27 NOTE — Progress Notes (Signed)
 ANNUAL DIABETIC FOOT EXAM  Subjective: Cynthia Torres presents today for annual diabetic foot exam. She has had back surgery and suffered a fall during her recovery. She broke her left hip during the fall and has had hip surgery and subsequent total hip revision. She has recently been diagnosed with DVT of LLE. Today, she is accompanied by her daughter, Cynthia Torres. Chief Complaint  Patient presents with   Diabetes    "Cut my toenails."  Saw Dr. Margaretann Torres two weeks ago. A1c - ?   Patient confirms h/o diabetes.  Patient denies any h/o foot wounds.  Cynthia Torres, Cynthia Torres is patient's PCP.  Past Medical History:  Diagnosis Date   Arthritis    Diabetes mellitus without complication (HCC)    Gout    HTN (hypertension)    Hyperlipidemia    Patient Active Problem List   Diagnosis Date Noted   T2DM (type 2 diabetes mellitus) (HCC) 04/12/2023   Closed dislocation of left hip, initial encounter (HCC) 04/10/2023   S/P lumbar fusion 02/19/2023   Sacroiliac joint pain 12/28/2021   Low back pain, unspecified 09/19/2021   Body mass index (BMI) 40.0-44.9, adult (HCC) 06/12/2021   Essential hypertension 06/12/2021   Gastroesophageal reflux disease 06/12/2021   Gout 06/12/2021   Hypercalcemia 06/12/2021   HLD (hyperlipidemia) 06/12/2021   Irritable bowel syndrome with constipation 06/12/2021   Lumbar spinal stenosis 06/12/2021   Morbid obesity (HCC) 06/12/2021   Osteoarthritis 06/12/2021   Primary insomnia 06/12/2021   Type 2 diabetes mellitus with other specified complication (HCC) 06/12/2021   Primary hyperparathyroidism (HCC) 06/12/2021   Chronic pain 08/24/2020   Myofascial pain 08/24/2020   Sacroiliitis, not elsewhere classified (HCC) 08/24/2020   Sciatica 08/24/2020   Acquired spondylolisthesis 05/05/2020   Displacement of lumbar intervertebral disc without myelopathy 05/05/2020   Acquired ptosis of eyelid of both eyes 09/03/2019   Primary osteoarthritis of first carpometacarpal joint of  right hand 05/21/2019   Trigger middle finger of right hand 05/21/2019   Cranial neuritis 02/23/2018   Past Surgical History:  Procedure Laterality Date   ABDOMINAL HYSTERECTOMY     CESAREAN SECTION     HIP CLOSED REDUCTION Left 04/10/2023   Procedure: CLOSED REDUCTION HIP;  Surgeon: Cynthia Laura, Cynthia Torres;  Location: [facility];  Service: Orthopedics;  Laterality: Left;   JOINT REPLACEMENT Bilateral    hips   TOTAL HIP REVISION Left 04/14/2023   Procedure: TOTAL HIP REVISION;  Surgeon: Cynthia Laura, Cynthia Torres;  Location: [facility];  Service: Orthopedics;  Laterality: Left;   Current Outpatient Medications on File Prior to Visit  Medication Sig Dispense Refill   allopurinol (ZYLOPRIM) 300 MG tablet Take 300 mg by mouth daily.     [Paused] amLODipine (NORVASC) 5 MG tablet Take 5 mg by mouth daily.     APIXABAN (ELIQUIS) VTE STARTER PACK (10MG  AND 5MG ) Take as directed on package: start with two-5mg  tablets twice daily for 7 days. On day 8, switch to one-5mg  tablet twice daily. 74 each 0   atenolol (TENORMIN) 100 MG tablet Take 100 mg by mouth daily.     cefadroxil (DURICEF) 500 MG capsule Take 1 capsule (500 mg total) by mouth 2 (two) times daily. 12 capsule 0   docusate sodium (COLACE) 100 MG capsule Take 1 capsule (100 mg total) by mouth 2 (two) times daily. 10 capsule 0   [Paused] furosemide (LASIX) 20 MG tablet Take 20 mg by mouth daily.     gabapentin (NEURONTIN) 100 MG capsule  Take 1 capsule (100 mg total) by mouth every 12 (twelve) hours as needed (As needed for moderate to severe pain). 20 capsule 0   [Paused] lisinopril-hydrochlorothiazide (PRINZIDE,ZESTORETIC) 20-25 MG tablet Take 1 tablet by mouth daily.     metFORMIN (GLUCOPHAGE) 500 MG tablet Take 1,000 mg by mouth 2 (two) times daily with a meal. 1,000mg  in AM and 500mg  in PM     methocarbamol (ROBAXIN) 500 MG tablet Take 1 tablet (500 mg total) by mouth every 6 (six) hours as needed for muscle spasms. 20 tablet 0   oxyCODONE 10 MG  TABS Take 1-1.5 tablets (10-15 mg total) by mouth every 4 (four) hours as needed for moderate pain (pain score 4-6) (pain score 4-6). 30 tablet 0   potassium chloride (MICRO-K) 10 MEQ CR capsule Take 10 mEq by mouth 2 (two) times daily.     rosuvastatin (CRESTOR) 10 MG tablet Take 5 mg by mouth once.     Vitamin D, Ergocalciferol, (DRISDOL) 1.25 MG (50000 UNIT) CAPS capsule Take 50,000 Units by mouth once a week.     meclizine (ANTIVERT) 25 MG tablet Take 1 tablet (25 mg total) by mouth 3 (three) times daily as needed for dizziness. (Patient not taking: Reported on 05/21/2023) 30 tablet 0   ondansetron (ZOFRAN) 4 MG tablet Take 1 tablet (4 mg total) by mouth every 6 (six) hours. (Patient not taking: Reported on 05/21/2023) 12 tablet 0   Current Facility-Administered Medications on File Prior to Visit  Medication Dose Route Frequency Provider Last Rate Last Admin   betamethasone acetate-betamethasone sodium phosphate (CELESTONE) injection 3 mg  3 mg Intramuscular Once Cynthia Torres, Cynthia Torres        Allergies  Allergen Reactions   Atorvastatin     Other reaction(s): myalgias   Social History   Occupational History   Not on file  Tobacco Use   Smoking status: Never   Smokeless tobacco: Never  Vaping Use   Vaping status: Never Used  Substance and Sexual Activity   Alcohol use: No    Alcohol/week: 0.0 standard drinks of alcohol   Drug use: No   Sexual activity: Not on file   Family History  Problem Relation Age of Onset   Breast cancer Neg Hx    Immunization History  Administered Date(s) Administered   Influenza, High Dose Seasonal PF 12/17/2017   PFIZER(Purple Top)SARS-COV-2 Vaccination 05/09/2019, 06/08/2019     Review of Systems: Negative except as noted in the HPI.   Objective: There were no vitals filed for this visit.  Cynthia Torres is a pleasant 78 y.o. female in NAD. AAO X 3.  Diabetic foot exam was performed with the following findings:   Normal sensation of 10g  monofilament Intact posterior tibialis and dorsalis pedis pulses Vascular Examination: Capillary refill time immediate b/l. Vascular status intact b/l with palpable pedal pulses. Pedal hair present b/l. No pain with calf compression b/l. Skin temperature gradient WNL b/l. No cyanosis or clubbing b/l. No ischemia or gangrene noted b/l. Nonpitting edema noted left lower extremity.  Neurological Examination: Sensation grossly intact b/l with 10 gram monofilament. Vibratory sensation intact b/l.   Dermatological Examination: Pedal skin with normal turgor, texture and tone b/l.  No open wounds. No interdigital macerations.   Toenails 1-5 b/l thick, discolored, elongated with subungual debris and pain on dorsal palpation.   Hyperkeratotic lesion(s) left second digit.  No erythema, no edema, no drainage, no fluctuance.  Musculoskeletal Examination: Muscle strength 5/5 to all lower extremity  muscle groups bilaterally. HAV with bunion deformity noted b/l LE. Hammertoe(s) left second digit.  Radiographs: None     Lab Results  Component Value Date   HGBA1C 5.6 02/19/2023   No results found. ADA Risk Categorization: Low Risk :  Patient has all of the following: Intact protective sensation No prior foot ulcer  No severe deformity Pedal pulses present  High Risk  Patient has one or more of the following: Loss of protective sensation Absent pedal pulses Severe Foot deformity History of foot ulcer  Assessment: 1. Pain due to onychomycosis of toenail   2. Corns   3. Hallux valgus, acquired, bilateral   4. Acquired hammertoes of both feet   5. Diabetes mellitus without complication (HCC)   6. Encounter for diabetic foot exam [facility])     Plan: -Patient's family member present. All questions/concerns addressed on today's visit. -Diabetic foot examination performed today. -Continue diabetic foot care principles: inspect feet daily, monitor glucose as recommended by PCP and/or  Endocrinologist, and follow prescribed diet per PCP, Endocrinologist and/or dietician. -Patient to continue soft, supportive shoe gear daily. -Toenails 1-5 b/l were debrided in length and girth with sterile nail nippers and dremel without iatrogenic bleeding.  -Corn(s) left second digit pared utilizing sterile scalpel blade. [place] bleeding addressed with Lumicain Hemostatic Solution. TAO applied. No furhter treatment required by patient. Total number debrided=1. -Patient/POA to call should there be question/concern in the interim. Return in about 3 months (around 08/21/2023).  Cynthia Torres, Cynthia Torres      [facility] LOCATION: [address] [place] [address]                   Office [phone]   [facility] LOCATION: [address] [place], [place] [address] Office [phone]

## 2023-05-28 DIAGNOSIS — T8131XD Disruption of external operation (surgical) wound, not elsewhere classified, subsequent encounter: Secondary | ICD-10-CM | POA: Diagnosis not present

## 2023-05-28 DIAGNOSIS — E1151 Type 2 diabetes mellitus with diabetic peripheral angiopathy without gangrene: Secondary | ICD-10-CM | POA: Diagnosis not present

## 2023-05-28 DIAGNOSIS — I10 Essential (primary) hypertension: Secondary | ICD-10-CM | POA: Diagnosis not present

## 2023-05-28 DIAGNOSIS — T84021D Dislocation of internal left hip prosthesis, subsequent encounter: Secondary | ICD-10-CM | POA: Diagnosis not present

## 2023-05-28 DIAGNOSIS — M109 Gout, unspecified: Secondary | ICD-10-CM | POA: Diagnosis not present

## 2023-05-30 DIAGNOSIS — M109 Gout, unspecified: Secondary | ICD-10-CM | POA: Diagnosis not present

## 2023-05-30 DIAGNOSIS — E1151 Type 2 diabetes mellitus with diabetic peripheral angiopathy without gangrene: Secondary | ICD-10-CM | POA: Diagnosis not present

## 2023-05-30 DIAGNOSIS — I10 Essential (primary) hypertension: Secondary | ICD-10-CM | POA: Diagnosis not present

## 2023-05-30 DIAGNOSIS — T84021D Dislocation of internal left hip prosthesis, subsequent encounter: Secondary | ICD-10-CM | POA: Diagnosis not present

## 2023-05-30 DIAGNOSIS — T8131XD Disruption of external operation (surgical) wound, not elsewhere classified, subsequent encounter: Secondary | ICD-10-CM | POA: Diagnosis not present

## 2023-06-03 DIAGNOSIS — T84031D Mechanical loosening of internal left hip prosthetic joint, subsequent encounter: Secondary | ICD-10-CM | POA: Diagnosis not present

## 2023-06-04 DIAGNOSIS — E1151 Type 2 diabetes mellitus with diabetic peripheral angiopathy without gangrene: Secondary | ICD-10-CM | POA: Diagnosis not present

## 2023-06-04 DIAGNOSIS — M109 Gout, unspecified: Secondary | ICD-10-CM | POA: Diagnosis not present

## 2023-06-04 DIAGNOSIS — T8131XD Disruption of external operation (surgical) wound, not elsewhere classified, subsequent encounter: Secondary | ICD-10-CM | POA: Diagnosis not present

## 2023-06-04 DIAGNOSIS — I10 Essential (primary) hypertension: Secondary | ICD-10-CM | POA: Diagnosis not present

## 2023-06-04 DIAGNOSIS — T84021D Dislocation of internal left hip prosthesis, subsequent encounter: Secondary | ICD-10-CM | POA: Diagnosis not present

## 2023-06-09 DIAGNOSIS — M109 Gout, unspecified: Secondary | ICD-10-CM | POA: Diagnosis not present

## 2023-06-09 DIAGNOSIS — T8131XD Disruption of external operation (surgical) wound, not elsewhere classified, subsequent encounter: Secondary | ICD-10-CM | POA: Diagnosis not present

## 2023-06-09 DIAGNOSIS — E1151 Type 2 diabetes mellitus with diabetic peripheral angiopathy without gangrene: Secondary | ICD-10-CM | POA: Diagnosis not present

## 2023-06-09 DIAGNOSIS — T84021D Dislocation of internal left hip prosthesis, subsequent encounter: Secondary | ICD-10-CM | POA: Diagnosis not present

## 2023-06-09 DIAGNOSIS — I10 Essential (primary) hypertension: Secondary | ICD-10-CM | POA: Diagnosis not present

## 2023-06-11 NOTE — Progress Notes (Signed)
 DVT Clinic Note  Name: Cynthia Torres     MRN: [id number]     DOB: Jan 13, 1946     Sex: female  PCP: Thana Ates, MD  Today's Visit: Visit Information: Initial Visit  Referred to DVT Clinic by: Emergency Department - Arthor Captain, PA-C Referred to CPP by: Dr. Hetty Blend Reason for referral:  Chief Complaint  Patient presents with   Med Management - DVT   HISTORY OF PRESENT ILLNESS: Cynthia Torres is a 78 y.o. female with PMH DVT 08/2015, T2DM, HTN, HLD, gout, IBS, GERD, who presents after diagnosis of DVT for medication management. Patient presented to the ED 05/20/23 with leg pain and swelling. She had back surgery 02/2023 and had a fall during recovery. She broke her hip and had closed reduction of the left hip on 04/10/23 and subsequent total hip revision 04/14/23. Since the surgery she developed progressively worsening swelling of the LLE. Ultrasound 05/20/23 showed acute DVT in the left common femoral, femoral, and popliteal veins. She was also experiencing SOB at that time, but CTA showed no evidence of PE. She was started on Eliquis and referred to DVT Clinic for follow up.   Today patient presents in a wheelchair accompanied by her daughter Pattricia Boss. Reports family history of DVT in her sister. She rates her swelling at the time of diagnosis as severe but that it has significantly improved since then. She has mild pain in her left inner thigh when she stands up, but otherwise is in no pain. Denies abnormal bleeding or bruising. Denies missed doses of Eliquis. She is now on the 5 mg BID dose. Has compression stockings at home but is not wearing them today. Has been elevating her legs but only up onto a stool. Prior to her fall and surgeries, she was very active. She is participating in physical therapy and is able to walk with a walker at home. She does not have long periods of inactivity at home. Denies SOB, chest pain. She tells me that with her prior LLE DVT in 2017, she was treated with warfarin  for a few months but there was no known cause of that DVT.   Positive Thrombotic Risk Factors: Previous VTE, Recent surgery (within 3 months), Recent trauma (within 3 months), Recent admission to hospital with acute illness (within 3 months), Older Age Bleeding Risk Factors: Age >65 years, Anticoagulant therapy  Negative Thrombotic Risk Factors: Paralysis, paresis, or recent plaster cast immobilization of lower extremity, Central venous catheterization, Bed rest >72 hours within 3 months, Sedentary journey lasting >8 hours within 4 weeks, Pregnancy, Within 6 weeks postpartum, Recent cesarean section (within 3 months), Estrogen therapy, Testosterone therapy, Erythropoiesis-stimulating agent, Recent COVID diagnosis (within 3 months), Active cancer, Non-malignant, chronic inflammatory condition, Known thrombophilic condition, Smoking, Obesity  Rx Insurance Coverage: Medicare Rx Affordability: Patient has $250 deductible that she has now paid through. Refills will be $47/month which is affordable for her.  Rx Assistance Provided:  None needed at this time Preferred Pharmacy: [facility] [address]   Past Medical History:  Diagnosis Date   Arthritis    Diabetes mellitus without complication (HCC)    Gout    HTN (hypertension)    Hyperlipidemia     Past Surgical History:  Procedure Laterality Date   ABDOMINAL HYSTERECTOMY     CESAREAN SECTION     HIP CLOSED REDUCTION Left 04/10/2023   Procedure: CLOSED REDUCTION HIP;  Surgeon: Joen Laura, MD;  Location: [facility] OR;  Service: Orthopedics;  Laterality: Left;   JOINT REPLACEMENT Bilateral    hips   TOTAL HIP REVISION Left 04/14/2023   Procedure: TOTAL HIP REVISION;  Surgeon: Joen Laura, MD;  Location: [facility];  Service: Orthopedics;  Laterality: Left;    Social History   Socioeconomic History   Marital status: Widowed    Spouse name: Not on file   Number of children: Not on file   Years of education: Not  on file   Highest education level: Not on file  Occupational History   Not on file  Tobacco Use   Smoking status: Never   Smokeless tobacco: Never  Vaping Use   Vaping status: Never Used  Substance and Sexual Activity   Alcohol use: No    Alcohol/week: 0.0 standard drinks of alcohol   Drug use: No   Sexual activity: Not on file  Other Topics Concern   Not on file  Social History Narrative   Not on file   Social Drivers of Health   Financial Resource Strain: Not on file  Food Insecurity: No Food Insecurity (04/10/2023)   Hunger Vital Sign    Worried About Running Out of Food in the Last Year: Never true    Ran Out of Food in the Last Year: Never true  Transportation Needs: No Transportation Needs (04/10/2023)   PRAPARE - Administrator, Civil Service (Medical): No    Lack of Transportation (Non-Medical): No  Physical Activity: Not on file  Stress: Not on file  Social Connections: Moderately Integrated (04/10/2023)   Social Connection and Isolation Panel [NHANES]    Frequency of Communication with Friends and Family: More than three times a week    Frequency of Social Gatherings with Friends and Family: Three times a week    Attends Religious Services: More than 4 times per year    Active Member of Clubs or Organizations: Yes    Attends Banker Meetings: More than 4 times per year    Marital Status: Widowed  Intimate Partner Violence: Not At Risk (04/10/2023)   Humiliation, Afraid, Rape, and Kick questionnaire    Fear of Current or Ex-Partner: No    Emotionally Abused: No    Physically Abused: No    Sexually Abused: No    Family History  Problem Relation Age of Onset   Breast cancer Neg Hx     Allergies as of 06/12/2023 - Review Complete 06/12/2023  Allergen Reaction Noted   Atorvastatin  05/16/2021    Current Outpatient Medications on File Prior to Encounter  Medication Sig Dispense Refill   acetaminophen (TYLENOL) 500 MG tablet Take 500 mg by  mouth every 6 (six) hours as needed for mild pain (pain score 1-3) or moderate pain (pain score 4-6).     allopurinol (ZYLOPRIM) 300 MG tablet Take 300 mg by mouth daily.     [Paused] amLODipine (NORVASC) 5 MG tablet Take 5 mg by mouth daily.     atenolol (TENORMIN) 100 MG tablet Take 100 mg by mouth daily.     docusate sodium (COLACE) 100 MG capsule Take 1 capsule (100 mg total) by mouth 2 (two) times daily. 10 capsule 0   ferrous sulfate 325 (65 FE) MG EC tablet Take 1 tablet by mouth every other day.     gabapentin (NEURONTIN) 100 MG capsule Take 1 capsule (100 mg total) by mouth every 12 (twelve) hours as needed (As needed for moderate to severe pain). 20 capsule 0   [  Paused] lisinopril-hydrochlorothiazide (PRINZIDE,ZESTORETIC) 20-25 MG tablet Take 1 tablet by mouth daily.     metFORMIN (GLUCOPHAGE) 500 MG tablet Take 1,000 mg by mouth 2 (two) times daily with a meal. 1,000mg  in AM and 500mg  in PM     potassium chloride (MICRO-K) 10 MEQ CR capsule Take 10 mEq by mouth 2 (two) times daily.     rosuvastatin (CRESTOR) 10 MG tablet Take 5 mg by mouth once.     Vitamin D, Ergocalciferol, (DRISDOL) 1.25 MG (50000 UNIT) CAPS capsule Take 50,000 Units by mouth once a week.     [Paused] furosemide (LASIX) 20 MG tablet Take 20 mg by mouth daily.     Current Facility-Administered Medications on File Prior to Encounter  Medication Dose Route Frequency Provider Last Rate Last Admin   betamethasone acetate-betamethasone sodium phosphate (CELESTONE) injection 3 mg  3 mg Intramuscular Once Felecia Shelling, DPM       REVIEW OF SYSTEMS:  Review of Systems  Respiratory:  Negative for shortness of breath.   Cardiovascular:  Positive for leg swelling. Negative for chest pain and palpitations.  Musculoskeletal:  Positive for myalgias.  Neurological:  Negative for dizziness and tingling.   PHYSICAL EXAMINATION:  Vitals:   06/12/23 0936  BP: (!) 145/80  Pulse: 68  SpO2: 99%   Physical Exam Vitals  reviewed.  Cardiovascular:     Rate and Rhythm: Normal rate.  Pulmonary:     Effort: Pulmonary effort is normal.  Musculoskeletal:        General: No tenderness.     Right lower leg: Edema (1+) present.     Left lower leg: Edema (2+) present.  Skin:    Findings: Erythema present. No bruising.  Psychiatric:        Mood and Affect: Mood normal.        Behavior: Behavior normal.        Thought Content: Thought content normal.   Villalta Score for Post-Thrombotic Syndrome: Pain: Absent Cramps: Absent Heaviness: Mild Paresthesia: Absent Pruritus: Absent Pretibial Edema: Mild Skin Induration: Absent Hyperpigmentation: Mild Redness: Mild Venous Ectasia: Absent Pain on calf compression: Absent Villalta Preliminary Score: 4 Is venous ulcer present?: No If venous ulcer is present and score is <15, then 15 points total are assigned: Absent Villalta Total Score: 4  LABS:  CBC     Component Value Date/Time   WBC 7.1 05/20/2023 1805   RBC 3.93 05/20/2023 1805   HGB 11.3 (L) 05/20/2023 1805   HGB 10.4 (L) 05/12/2023 1316   HCT 37.3 05/20/2023 1805   HCT 33.6 (L) 05/12/2023 1316   PLT 254 05/20/2023 1805   PLT 275 05/12/2023 1316   MCV 94.9 05/20/2023 1805   MCV 92 05/12/2023 1316   MCH 28.8 05/20/2023 1805   MCHC 30.3 05/20/2023 1805   RDW 13.6 05/20/2023 1805   RDW 13.5 05/12/2023 1316   LYMPHSABS 1.3 05/12/2023 1316   MONOABS 0.6 04/10/2023 1236   EOSABS 0.5 (H) 05/12/2023 1316   BASOSABS 0.1 05/12/2023 1316    Hepatic Function      Component Value Date/Time   PROT 7.1 05/20/2023 1805   ALBUMIN 3.8 05/20/2023 1805   AST 13 (L) 05/20/2023 1805   ALT 9 05/20/2023 1805   ALKPHOS 94 05/20/2023 1805   BILITOT 0.7 05/20/2023 1805    Renal Function   Lab Results  Component Value Date   CREATININE 0.74 05/20/2023   CREATININE 0.64 05/12/2023   CREATININE 0.72 04/18/2023    CrCl cannot  be calculated (Patient's most recent lab result is older than the maximum 21 days  allowed.).   VVS Vascular Lab Studies:  05/20/23 VAS [place] LOWER EXTREMITY VENOUS LEFT (DVT) Summary:  RIGHT:  - No evidence of common femoral vein obstruction.    LEFT:  - Findings consistent with acute deep vein thrombosis involving the left  common femoral vein, left femoral vein, and left popliteal vein.  - No cystic structure found in the popliteal fossa.   05/20/23 CT angio (PE) IMPRESSION: 1. No evidence of pulmonary embolus. 2. Cardiomegaly. 3. Evidence of pulmonary arterial hypertension. 4. No acute intrathoracic process. 5. Aortic Atherosclerosis (ICD10-I70.0). Coronary artery atherosclerosis.  ASSESSMENT: Location of DVT: Left common femoral vein, Left femoral vein, Left popliteal vein Cause of DVT: provoked by a transient risk factor but with history of unprovoked DVT  Patient with history of left popliteal DVT 08/2015, recently diagnosed with recurrent LLE DVT 05/20/23 s/p admissions for multiple recent surgeries in the past few months, the last being total hip revision 04/14/23. Patient and daughter report that her prior DVT had no known cause, she was treated with warfarin for a few months, and follow up imaging available in Epic shows resolution of the clot. She also reports a history of DVT in her sister. In light of a history of unprovoked DVT, now with recurrent DVT in the same leg though multiple provoking risk factors are present, will refer patient to hematology for further work up. With this being her second DVT and with her former DVT appearing to be unprovoked, she may benefit from lifelong anticoagulation. She is tolerating Eliquis well thus far. Her symptoms of pain and swelling have significantly improved. No indication for vascular intervention. Counseled patient on Eliquis, and all of her questions have been answered. Provided refills to her preferred pharmacy, future fills will need to come from hematology if the plan is to continue treatment. No concerns related to  medication access or adherence at this time. Counseled patient on importance of compression and proper elevation.   PLAN: -Continue apixaban (Eliquis) 5 mg twice daily. -Expected duration of therapy: per hematology. Therapy started on 05/20/23. -Patient educated on purpose, proper use and potential adverse effects of apixaban (Eliquis). -Discussed importance of taking medication around the same time every day. -Advised patient of medications to avoid (NSAIDs, aspirin doses >100 mg daily). -Educated that Tylenol (acetaminophen) is the preferred analgesic to lower the risk of bleeding. -Advised patient to alert all providers of anticoagulation therapy prior to starting a new medication or having a procedure. -Emphasized importance of monitoring for signs and symptoms of bleeding (abnormal bruising, prolonged bleeding, nose bleeds, bleeding from gums, discolored urine, black tarry stools). -Educated patient to present to the ED if emergent signs and symptoms of new thrombosis occur. -Counseled patient to wear compression stockings daily, removing at night. Elevate legs to help improve swelling.   Follow up: Referral to hematology placed. DVT Clinic available as needed.   Pervis Hocking, PharmD, [place], CPP Deep Vein Thrombosis Clinic Clinical Pharmacist Practitioner

## 2023-06-12 ENCOUNTER — Ambulatory Visit (HOSPITAL_COMMUNITY)
Admission: RE | Admit: 2023-06-12 | Discharge: 2023-06-12 | Disposition: A | Discharge status: Home / Self Care | Source: Ambulatory Visit | Attending: Vascular Surgery | Admitting: Vascular Surgery

## 2023-06-12 VITALS — BP 145/80 | HR 68

## 2023-06-12 DIAGNOSIS — I82412 Acute embolism and thrombosis of left femoral vein: Secondary | ICD-10-CM | POA: Diagnosis not present

## 2023-06-12 MED ORDER — APIXABAN 5 MG PO TABS: 5.0000 mg | ORAL_TABLET | Freq: Two times a day (BID) | ORAL | 5 refills | Status: DC

## 2023-06-12 NOTE — Patient Instructions (Signed)
-  Continue apixaban (Eliquis) 5 mg twice daily. -[facility]. You may need to call the pharmacy to ask them to fill this when you start to run low on your current supply.  -Elevate your legs - feet above your heart - to help improve your swelling.  -It is important to take your medication around the same time every day.  -Avoid NSAIDs like ibuprofen (Advil, Motrin) and naproxen (Aleve) as well as aspirin doses over 100 mg daily. -Tylenol (acetaminophen) is the preferred over the counter pain medication to lower the risk of bleeding. -Be sure to alert all of your health care providers that you are taking an anticoagulant prior to starting a new medication or having a procedure. -Monitor for signs and symptoms of bleeding (abnormal bruising, prolonged bleeding, nose bleeds, bleeding from gums, discolored urine, black tarry stools). If you have fallen and hit your head OR if your bleeding is severe or not stopping, seek emergency care.  -Go to the emergency room if emergent signs and symptoms of new clot occur (new or worse swelling and pain in an arm or leg, shortness of breath, chest pain, fast or irregular heartbeats, lightheadedness, dizziness, fainting, coughing up blood) or if you experience a significant color change (pale or blue) in the extremity that has the DVT.  -We recommend you wear compression stockings (20-30 mmHg) as long as you are having swelling or pain. Be sure to purchase the correct size and take them off at night.   If you have any questions or need to reschedule an appointment, please call [phone] [facility] (active number until 06/30/23; after that you will need to call [phone]).  If you are having an emergency, call 911 or present to the nearest emergency room.   What is a DVT?  -Deep vein thrombosis (DVT) is a condition in which a blood clot forms in a vein of the deep venous system which can occur in the lower leg, thigh, pelvis, arm, or  neck. This condition is serious and can be life-threatening if the clot travels to the arteries of the lungs and causing a blockage (pulmonary embolism, PE). A DVT can also damage veins in the leg, which can lead to long-term venous disease, leg pain, swelling, discoloration, and ulcers or sores (post-thrombotic syndrome).  -Treatment may include taking an anticoagulant medication to prevent more clots from forming and the current clot from growing, wearing compression stockings, and/or surgical procedures to remove or dissolve the clot.

## 2023-06-13 DIAGNOSIS — T84021D Dislocation of internal left hip prosthesis, subsequent encounter: Secondary | ICD-10-CM | POA: Diagnosis not present

## 2023-06-13 DIAGNOSIS — I10 Essential (primary) hypertension: Secondary | ICD-10-CM | POA: Diagnosis not present

## 2023-06-13 DIAGNOSIS — T8131XD Disruption of external operation (surgical) wound, not elsewhere classified, subsequent encounter: Secondary | ICD-10-CM | POA: Diagnosis not present

## 2023-06-13 DIAGNOSIS — E1151 Type 2 diabetes mellitus with diabetic peripheral angiopathy without gangrene: Secondary | ICD-10-CM | POA: Diagnosis not present

## 2023-06-13 DIAGNOSIS — M109 Gout, unspecified: Secondary | ICD-10-CM | POA: Diagnosis not present

## 2023-06-18 IMAGING — NM NM BONE 3 PHASE
10 series · 20 of 20 positions shown · non-contrast
Comparison: None available.

CLINICAL DATA: Bilateral total hip arthroplasty 19 years prior,
chronic left hip pain

EXAM:
NUCLEAR MEDICINE 3-PHASE BONE SCAN
TECHNIQUE: Radionuclide angiographic images, immediate static blood pool
images, and 3-hour delayed static images were obtained of the hips
bilaterally after intravenous injection of radiopharmaceutical.
RADIOPHARMACEUTICALS:  20.3 mCi 7c-UUm MDP IV

[Series 1: flow · 2.07mm/px · 6 of 48 frames shown (1 of 2)]
[frame 5/48]
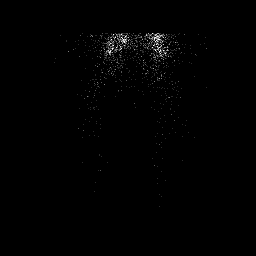
[frame 13/48  full-range]
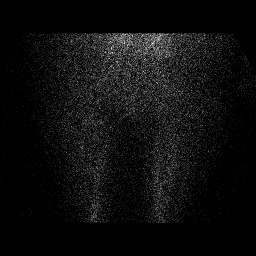
[frame 21/48  full-range]
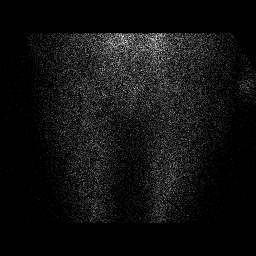
[frame 29/48  full-range]
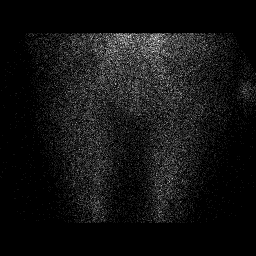
[frame 37/48  full-range]
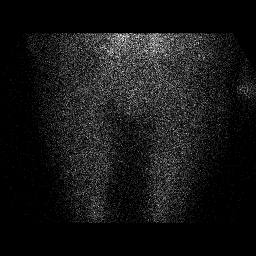
[frame 45/48  full-range]
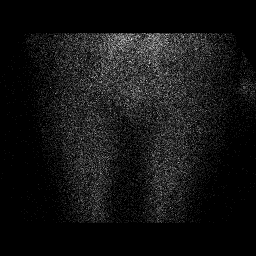

[Series 1: flow · 2.07mm/px · 6 of 48 frames shown (2 of 2)]
[frame 5/48  full-range]
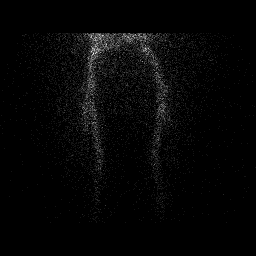
[frame 13/48  full-range]
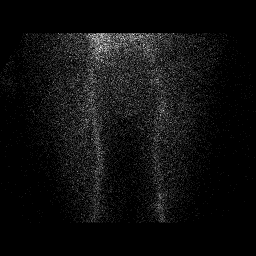
[frame 21/48  full-range]
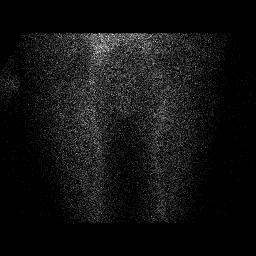
[frame 29/48  full-range]
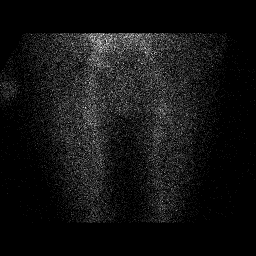
[frame 37/48  full-range]
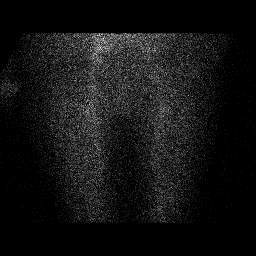
[frame 45/48  full-range]
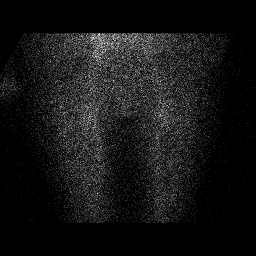

[Series 2: blood pool · 2.07mm/px · 1 of 1 slices shown (1 of 4)]
[im 1/1  full-range]
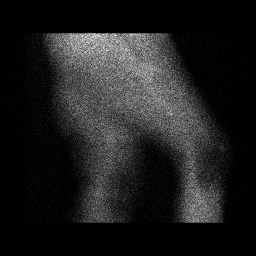

[Series 2: blood pool · 2.07mm/px · 1 of 1 slices shown (2 of 4)]
[im 1/1  full-range]
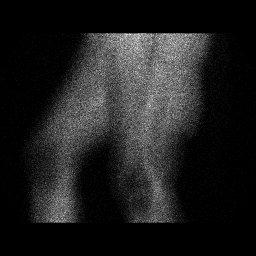

[Series 3: blood pool · 2.07mm/px · 1 of 1 slices shown (3 of 4)]
[im 1/1]
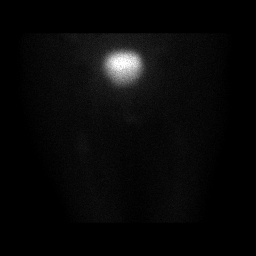

[Series 3: blood pool · 2.07mm/px · 1 of 1 slices shown (4 of 4)]
[im 1/1]
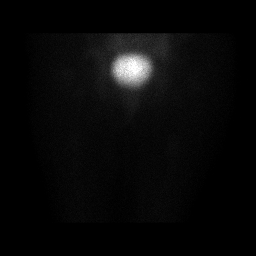

[Series 4: delay · delayed · 2.07mm/px · 1 of 1 slices shown (1 of 2)]
[im 1/1]
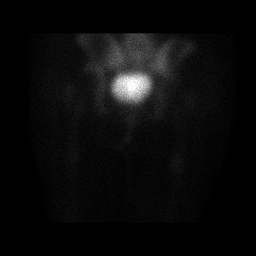

[Series 4: delay · delayed · 2.07mm/px · 1 of 1 slices shown (2 of 2)]
[im 1/1]
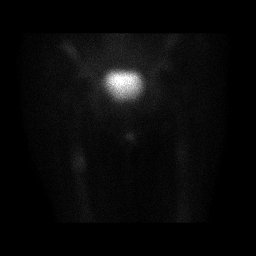

[Series 6: bone statics · 2.07mm/px · 1 of 1 slices shown (1 of 2)]
[im 1/1]
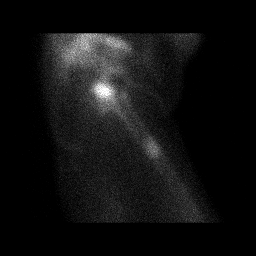

[Series 6: bone statics · 2.07mm/px · 1 of 1 slices shown (2 of 2)]
[im 1/1]
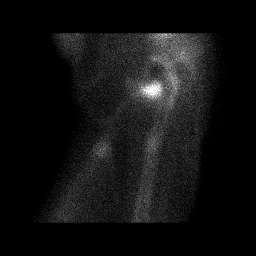

[20 of 20 positions shown; findings below may reference images not displayed]

FINDINGS: Vascular phase: Normal symmetric perfusion of the hips bilaterally

Blood pool phase: Defects related to bilateral total hip
arthroplasty are noted. There is subtly increased activity within
the mid diaphysis of the a femurs bilaterally immediately subjacent
to the femoral stem arthroplasty components.

Delayed phase: There is increased uptake within the femoral
diaphyses bilaterally, right greater than left, immediately
subjacent to the femoral stem components in the area of focally
increased uptake noted on blood pool activity. Additionally, best
appreciated on posterior imaging, is subtly increased uptake within
the greater and lesser trochanters bilaterally.
IMPRESSION: Findings most in keeping with aseptic loosening of the hip
prostheses bilaterally. Mild hyperemia involving the mid diaphyses
of the femurs may relate to stress response or developing stress
fracture. Correlation with plain radiographs is recommended.

## 2023-06-24 ENCOUNTER — Ambulatory Visit: Admitting: Podiatry

## 2023-06-24 ENCOUNTER — Encounter: Payer: Self-pay | Admitting: Podiatry

## 2023-06-24 DIAGNOSIS — L03115 Cellulitis of right lower limb: Secondary | ICD-10-CM | POA: Diagnosis not present

## 2023-06-24 DIAGNOSIS — I739 Peripheral vascular disease, unspecified: Secondary | ICD-10-CM

## 2023-06-24 DIAGNOSIS — M7989 Other specified soft tissue disorders: Secondary | ICD-10-CM

## 2023-06-24 DIAGNOSIS — L89616 Pressure-induced deep tissue damage of right heel: Secondary | ICD-10-CM | POA: Diagnosis not present

## 2023-06-24 DIAGNOSIS — M79661 Pain in right lower leg: Secondary | ICD-10-CM

## 2023-06-24 MED ORDER — CEPHALEXIN 500 MG PO CAPS
500.0000 mg | ORAL_CAPSULE | Freq: Three times a day (TID) | ORAL | 0 refills | Status: DC
Start: 2023-06-24 — End: 2023-07-21

## 2023-06-24 NOTE — Progress Notes (Signed)
  Subjective:  Patient ID: Cynthia Torres, female    DOB: 08/10/1945,  MRN: [id number]  Chief Complaint  Patient presents with   Blister    Patient states that she ha a blister on the side of her right heel medial and it has started Monday, patient also stated that she is having pain between her hallux and 2nd toe .    78 y.o. female presents with the above complaint. History confirmed with patient.  After the last visit she did go for completing the ultrasound that had been ordered and was diagnosed with a DVT on the left leg.  She is on Eliquis  now.  Objective:  Physical Exam: warm, good capillary refill, deep tissue injury on plantar medial heel with blister, DP and PT pulses, and bilaterally she has +1 edema, appears to be worse today on the right side, nonpalpable pulses due to the edema   ESR and uric acid was normal   Assessment:   1. Pressure injury of deep tissue of right heel   2. Cellulitis of right leg   3. Pain and swelling of right lower leg   4. PAD (peripheral artery disease) (HCC)      Plan:  Patient was evaluated and treated and all questions answered.  Has new pressure ulcer on the plantar medial right heel.  We discussed treatment of these including ensuring offloading at all times.  I recommend applying Betadine  to the blister until it heals.  I did recommend also checking an ABI on the right side for arterial circulation testing and as well as a DVT ultrasound on the right lower extremity as well which is unlikely while on Eliquis  but not impossible.  Referrals for these were placed.  She has early cellulitis from the deep tissue injury.  I placed her on cephalexin  as well.  She will return in 3 weeks to reevaluate  Return in about 3 weeks (around 07/15/2023) for check pressure sore on right heel .

## 2023-06-24 NOTE — Patient Instructions (Addendum)
 Call  [phone] and let the receptionist know that you need to schedule your vascular testing   Keep all pressure off the heel at all times   Apply Betadine  or iodine  ointment to the heel every day

## 2023-07-02 NOTE — Addendum Note (Signed)
 Addended byMichalene Agee, [place] R on: 07/02/2023 09:42 AM   Modules accepted: Orders

## 2023-07-10 DIAGNOSIS — Z0000 Encounter for general adult medical examination without abnormal findings: Secondary | ICD-10-CM | POA: Diagnosis not present

## 2023-07-10 DIAGNOSIS — I82402 Acute embolism and thrombosis of unspecified deep veins of left lower extremity: Secondary | ICD-10-CM | POA: Diagnosis not present

## 2023-07-10 DIAGNOSIS — E559 Vitamin D deficiency, unspecified: Secondary | ICD-10-CM | POA: Diagnosis not present

## 2023-07-10 DIAGNOSIS — D508 Other iron deficiency anemias: Secondary | ICD-10-CM | POA: Diagnosis not present

## 2023-07-10 DIAGNOSIS — M109 Gout, unspecified: Secondary | ICD-10-CM | POA: Diagnosis not present

## 2023-07-10 DIAGNOSIS — I10 Essential (primary) hypertension: Secondary | ICD-10-CM | POA: Diagnosis not present

## 2023-07-10 DIAGNOSIS — Z79899 Other long term (current) drug therapy: Secondary | ICD-10-CM | POA: Diagnosis not present

## 2023-07-10 DIAGNOSIS — E210 Primary hyperparathyroidism: Secondary | ICD-10-CM | POA: Diagnosis not present

## 2023-07-10 DIAGNOSIS — E119 Type 2 diabetes mellitus without complications: Secondary | ICD-10-CM | POA: Diagnosis not present

## 2023-07-10 DIAGNOSIS — Z23 Encounter for immunization: Secondary | ICD-10-CM | POA: Diagnosis not present

## 2023-07-10 DIAGNOSIS — R748 Abnormal levels of other serum enzymes: Secondary | ICD-10-CM | POA: Diagnosis not present

## 2023-07-10 DIAGNOSIS — E7800 Pure hypercholesterolemia, unspecified: Secondary | ICD-10-CM | POA: Diagnosis not present

## 2023-07-11 ENCOUNTER — Other Ambulatory Visit: Payer: Self-pay | Admitting: Internal Medicine

## 2023-07-11 DIAGNOSIS — M85839 Other specified disorders of bone density and structure, unspecified forearm: Secondary | ICD-10-CM

## 2023-07-15 ENCOUNTER — Ambulatory Visit: Admitting: Podiatry

## 2023-07-15 DIAGNOSIS — T84031D Mechanical loosening of internal left hip prosthetic joint, subsequent encounter: Secondary | ICD-10-CM | POA: Diagnosis not present

## 2023-07-21 ENCOUNTER — Encounter: Payer: Self-pay | Admitting: Hematology and Oncology

## 2023-07-21 ENCOUNTER — Inpatient Hospital Stay

## 2023-07-21 ENCOUNTER — Inpatient Hospital Stay: Attending: Hematology and Oncology | Admitting: Hematology and Oncology

## 2023-07-21 VITALS — BP 155/67 | HR 61 | Temp 98.4°F | Resp 18 | Ht 59.0 in | Wt 156.6 lb

## 2023-07-21 DIAGNOSIS — R600 Localized edema: Secondary | ICD-10-CM | POA: Diagnosis not present

## 2023-07-21 DIAGNOSIS — Z7901 Long term (current) use of anticoagulants: Secondary | ICD-10-CM | POA: Insufficient documentation

## 2023-07-21 DIAGNOSIS — R262 Difficulty in walking, not elsewhere classified: Secondary | ICD-10-CM | POA: Insufficient documentation

## 2023-07-21 DIAGNOSIS — I82412 Acute embolism and thrombosis of left femoral vein: Secondary | ICD-10-CM | POA: Diagnosis not present

## 2023-07-21 DIAGNOSIS — Z86718 Personal history of other venous thrombosis and embolism: Secondary | ICD-10-CM | POA: Diagnosis not present

## 2023-07-21 DIAGNOSIS — Z8249 Family history of ischemic heart disease and other diseases of the circulatory system: Secondary | ICD-10-CM | POA: Diagnosis not present

## 2023-07-21 DIAGNOSIS — Z79899 Other long term (current) drug therapy: Secondary | ICD-10-CM | POA: Diagnosis not present

## 2023-07-21 DIAGNOSIS — Z90710 Acquired absence of both cervix and uterus: Secondary | ICD-10-CM | POA: Diagnosis not present

## 2023-07-21 DIAGNOSIS — M109 Gout, unspecified: Secondary | ICD-10-CM | POA: Diagnosis not present

## 2023-07-21 DIAGNOSIS — I10 Essential (primary) hypertension: Secondary | ICD-10-CM | POA: Insufficient documentation

## 2023-07-21 DIAGNOSIS — Z832 Family history of diseases of the blood and blood-forming organs and certain disorders involving the immune mechanism: Secondary | ICD-10-CM | POA: Insufficient documentation

## 2023-07-21 NOTE — Assessment & Plan Note (Signed)
 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 Her most recent episode of DVT was clearly provoked after recent surgery to the left hip She continues to have abnormal gait and reduced mobility; she has strong family history of DVT of which her sister have IVC filter placement Overall, her risk of recurrent DVT would be very high and I do not advocate for her to stop after completion of 3 months of anticoagulation therapy I recommend she continues on current dose Eliquis  while undergoing workup with endocrinologist for hypercalcemia I plan to see her again in 3 months for further follow-up If her mobility has improved and leg swelling has improved, we can consider reducing the dose of Eliquis  to 2.5 mg in the future for secondary prevention

## 2023-07-21 NOTE — Progress Notes (Signed)
 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 Her most recent episode of DVT was clearly provoked after recent surgery to the left hip She continues to have abnormal gait and reduced mobility; she has strong family history of DVT of which her sister have IVC filter placement Overall, her risk of recurrent DVT would be very high and I do not advocate for her to stop after completion of 3 months of anticoagulation therapy I recommend she continues on current dose Eliquis  while undergoing workup with endocrinologist for hypercalcemia I plan to see her again in 3 months for further follow-up If her mobility has improved and leg swelling has improved, we can consider reducing the dose of Eliquis  to 2.5 mg in the future for secondary prevention  Hypercalcemia She was noted to have hypercalcemia It is not clear whether she has primary or secondary hyperparathyroidism  Even though there is a diagnosis in the chart for primary hyperparathyroidism, I do not see PTH level being done She has appointment scheduled to see endocrinologist tomorrow for evaluation I advocate increase oral fluid intake   Orders Placed This Encounter  Procedures   CBC with Differential (Cancer Center Only)    Standing Status:   Future    Expiration Date:   07/20/2024   Basic Metabolic Panel - Cancer Center Only    Standing Status:   Future    Expiration Date:   07/20/2024   D-dimer, quantitative    Standing Status:   Future    Expiration Date:   07/20/2024    All questions were answered. The patient knows to call the clinic with any problems, questions or concerns. The total time spent in the appointment was 60 minutes encounter with patients including review of chart and various tests results,  discussions about plan of care and coordination of care plan  [name], MD 5/19/20252:29 PM  CHIEF COMPLAINTS/PURPOSE OF CONSULTATION:  Recurrent left lower extremity DVT  HISTORY OF PRESENTING ILLNESS:  Cynthia Torres 78 y.o. female is here because of recent diagnosis of recurrent left lower extremity DVT She is here accompanied by her daughter, Cynthia Torres She has 3 children, retired She was recently found to have recurrent acute left lower extremity DVT She has remote history of DVT in 2017 The circumstances leading to her first DVT diagnosis was unknown and the patient cannot recall the details On August 28, 2015, acute left lower extremity venous Doppler ultrasound revealed occlusive thrombus within the left popliteal vein.  She was anticoagulated. On Jul 19, 2016, repeat venous Doppler ultrasound showed no evidence of DVT.  She had numerous other evaluations, including another ultrasound venous Doppler on October 09, 2016, September 30, 2018, June 15, 2021 and June 20,2023 of which all venous Doppler ultrasounds were negative for DVT In December 2024, she underwent spinal fusion surgery  Since then, her mobility has not been good She fell at the end of January and have difficulties walking.  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  Upon discharge, she was placed on aspirin  therapy for DVT prophylaxis She presented with significant swelling and pain on the left lower extremity  a month later  On May 20, 2023, repeat venous Doppler ultrasound showed findings consistent with acute deep vein thrombosis involving the left, common femoral vein, left femoral vein, and left popliteal vein.  CT angiogram showed no evidence of pulmonary embolus but there are signs of pulmonary artery hypertension and cardiomegaly She is placed on anticoagulation therapy and she tolerated well without  major bleeding She continues to have profound lower extremity edema but denies pain. She denies recent chest pain on exertion, shortness of breath on minimal exertion, pre-syncopal episodes, hemoptysis, or palpitation. Her mobility remain poor She is going to start physical therapy soon She is noted to have hypercalcemia on blood work in March and has appointment to see endocrinologist tomorrow for evaluation of hypercalcemia She had no prior history or diagnosis of cancer. Her age appropriate screening programs are up-to-date. She had prior surgeries before and never had perioperative thromboembolic events. The patient has never taken birth control pill or hormone replacement therapy The patient had been pregnant before and denies history of peripartum thromboembolic event  There is strong family history of blood clot in her sister who has an IVC filter placed  MEDICAL HISTORY:  Past Medical History:  Diagnosis Date   Arthritis    Diabetes mellitus without complication (HCC)    Gout    HTN (hypertension)    Hyperlipidemia     SURGICAL HISTORY: Past Surgical History:  Procedure Laterality Date   ABDOMINAL HYSTERECTOMY     CESAREAN SECTION     HIP CLOSED REDUCTION Left 04/10/2023   Procedure: CLOSED REDUCTION HIP;  Surgeon: Murleen Arms, MD;  Location: [facility] OR;  Service: Orthopedics;  Laterality: Left;   JOINT REPLACEMENT Bilateral    hips   TOTAL HIP REVISION Left 04/14/2023   Procedure: TOTAL HIP REVISION;  Surgeon: Murleen Arms, MD;  Location: [facility] ORS;  Service: Orthopedics;  Laterality: Left;    SOCIAL HISTORY: Social History   Socioeconomic History   Marital status: Widowed    Spouse name: Not on file   Number of children: 3   Years of education: Not on file   Highest education level: Not on file  Occupational History   Not on file  Tobacco Use   Smoking status: Never   Smokeless tobacco: Never  Vaping Use   Vaping status: Never Used  Substance and  Sexual Activity   Alcohol  use: No    Alcohol /week: 0.0 standard drinks of alcohol    Drug use: No   Sexual activity: Not on file  Other Topics Concern   Not on file  Social History Narrative   Not on file   Social Drivers of Health   Financial Resource Strain: Not on file  Food Insecurity: No Food Insecurity (04/10/2023)   Hunger Vital Sign    Worried About Running Out of Food in the Last Year: Never true    Ran Out of Food in the Last Year: Never true  Transportation Needs: No Transportation Needs (04/10/2023)   PRAPARE - Administrator, Civil Service (Medical): No    Lack of Transportation (Non-Medical): No  Physical Activity: Not on file  Stress: Not on file  Social Connections: Moderately Integrated (04/10/2023)   Social Connection and Isolation Panel [NHANES]    Frequency of Communication with Friends and Family: More than three times a week    Frequency of Social Gatherings with Friends and Family: Three times a week    Attends Religious Services: More than 4 times per year  Active Member of Clubs or Organizations: Yes    Attends Banker Meetings: More than 4 times per year    Marital Status: Widowed  Intimate Partner Violence: Not At Risk (04/10/2023)   Humiliation, Afraid, Rape, and Kick questionnaire    Fear of Current or Ex-Partner: No    Emotionally Abused: No    Physically Abused: No    Sexually Abused: No    FAMILY HISTORY: Family History  Problem Relation Age of Onset   Clotting disorder Sister    Breast cancer Neg Hx     ALLERGIES:  is allergic to atorvastatin.  MEDICATIONS:  Current Outpatient Medications  Medication Sig Dispense Refill   acetaminophen  (TYLENOL ) 500 MG tablet Take 500 mg by mouth every 6 (six) hours as needed for mild pain (pain score 1-3) or moderate pain (pain score 4-6).     allopurinol  (ZYLOPRIM ) 300 MG tablet Take 300 mg by mouth daily.     amLODipine  (NORVASC ) 5 MG tablet Take 5 mg by mouth daily.      apixaban  (ELIQUIS ) 5 MG TABS tablet Take 1 tablet (5 mg total) by mouth 2 (two) times daily. 60 tablet 5   atenolol  (TENORMIN ) 100 MG tablet Take 100 mg by mouth daily.     docusate sodium  (COLACE) 100 MG capsule Take 1 capsule (100 mg total) by mouth 2 (two) times daily. 10 capsule 0   ferrous sulfate 325 (65 FE) MG EC tablet Take 1 tablet by mouth every other day.     furosemide  (LASIX ) 20 MG tablet Take 20 mg by mouth daily.     gabapentin  (NEURONTIN ) 100 MG capsule Take 1 capsule (100 mg total) by mouth every 12 (twelve) hours as needed (As needed for moderate to severe pain). 20 capsule 0   lisinopril -hydrochlorothiazide  (PRINZIDE ,ZESTORETIC ) 20-25 MG tablet Take 1 tablet by mouth daily.     metFORMIN  (GLUCOPHAGE ) 500 MG tablet Take 1,000 mg by mouth 2 (two) times daily with a meal. 1,000mg  in AM and 500mg  in PM     potassium chloride  (MICRO-K ) 10 MEQ CR capsule Take 10 mEq by mouth 2 (two) times daily.     rosuvastatin  (CRESTOR ) 10 MG tablet Take 5 mg by mouth once.     Vitamin D, Ergocalciferol, (DRISDOL) 1.25 MG (50000 UNIT) CAPS capsule Take 50,000 Units by mouth once a week.     Current Facility-Administered Medications  Medication Dose Route Frequency Provider Last Rate Last Admin   betamethasone  acetate-betamethasone  sodium phosphate  (CELESTONE ) injection 3 mg  3 mg Intramuscular Once Dot Gazella, DPM        REVIEW OF SYSTEMS:   Constitutional: Denies fevers, chills or abnormal night sweats Eyes: Denies blurriness of vision, double vision or watery eyes Ears, nose, mouth, throat, and face: Denies mucositis or sore throat Respiratory: Denies cough, dyspnea or wheezes Gastrointestinal:  Denies nausea, heartburn or change in bowel habits Skin: Denies abnormal skin rashes Lymphatics: Denies new lymphadenopathy or easy bruising Neurological:Denies numbness, tingling or new weaknesses Behavioral/Psych: Mood is stable, no new changes  All other systems were reviewed with the  patient and are negative.  PHYSICAL EXAMINATION: ECOG PERFORMANCE STATUS: 2 - Symptomatic, <50% confined to bed  Vitals:   07/21/23 1407  BP: (!) 155/67  Pulse: 61  Resp: 18  Temp: 98.4 F (36.9 C)  SpO2: 100%   Filed Weights   07/21/23 1407  Weight: 156 lb 9.6 oz (71 kg)    GENERAL:alert, no distress and comfortable SKIN: skin color,  texture, turgor are normal, no rashes or significant lesions EYES: normal, conjunctiva are pink and non-injected, sclera clear OROPHARYNX:no exudate, no erythema and lips, buccal mucosa, and tongue normal  NECK: supple, thyroid normal size, non-tender, without nodularity LYMPH:  no palpable lymphadenopathy in the cervical, axillary or inguinal LUNGS: clear to auscultation and percussion with normal breathing effort HEART: regular rate & rhythm and no murmurs with severe left lower extremity edema ABDOMEN:abdomen soft, non-tender and normal bowel sounds Musculoskeletal:no cyanosis of digits and no clubbing  PSYCH: alert & oriented x 3 with fluent speech NEURO: no focal motor/sensory deficits.  She has abnormal gait, uses a cane when she walks  LABORATORY DATA:  I have reviewed the data as listed Lab Results  Component Value Date   WBC 7.1 05/20/2023   HGB 11.3 (L) 05/20/2023   HCT 37.3 05/20/2023   MCV 94.9 05/20/2023   PLT 254 05/20/2023

## 2023-07-21 NOTE — Assessment & Plan Note (Addendum)
 She was noted to have hypercalcemia It is not clear whether she has primary or secondary hyperparathyroidism  Even though there is a diagnosis in the chart for primary hyperparathyroidism, I do not see PTH level being done She has appointment scheduled to see endocrinologist tomorrow for evaluation I advocate increase oral fluid intake

## 2023-07-22 ENCOUNTER — Other Ambulatory Visit (HOSPITAL_COMMUNITY): Payer: Self-pay | Admitting: Internal Medicine

## 2023-07-22 ENCOUNTER — Ambulatory Visit (HOSPITAL_COMMUNITY): Admission: RE | Admit: 2023-07-22 | Source: Ambulatory Visit

## 2023-07-22 DIAGNOSIS — E210 Primary hyperparathyroidism: Secondary | ICD-10-CM | POA: Diagnosis not present

## 2023-07-22 DIAGNOSIS — M85839 Other specified disorders of bone density and structure, unspecified forearm: Secondary | ICD-10-CM | POA: Diagnosis not present

## 2023-07-25 DIAGNOSIS — E210 Primary hyperparathyroidism: Secondary | ICD-10-CM | POA: Diagnosis not present

## 2023-07-30 DIAGNOSIS — Z96642 Presence of left artificial hip joint: Secondary | ICD-10-CM | POA: Diagnosis not present

## 2023-08-07 DIAGNOSIS — Z96642 Presence of left artificial hip joint: Secondary | ICD-10-CM | POA: Diagnosis not present

## 2023-08-12 DIAGNOSIS — Z96642 Presence of left artificial hip joint: Secondary | ICD-10-CM | POA: Diagnosis not present

## 2023-08-14 DIAGNOSIS — Z96642 Presence of left artificial hip joint: Secondary | ICD-10-CM | POA: Diagnosis not present

## 2023-08-20 ENCOUNTER — Ambulatory Visit (INDEPENDENT_AMBULATORY_CARE_PROVIDER_SITE_OTHER): Payer: Medicare Other | Admitting: Podiatry

## 2023-08-20 DIAGNOSIS — Z5982 Transportation insecurity: Secondary | ICD-10-CM

## 2023-08-20 NOTE — Progress Notes (Signed)
 1. Lack of access to transportation

## 2023-08-27 ENCOUNTER — Ambulatory Visit: Admitting: Podiatry

## 2023-09-01 DIAGNOSIS — I10 Essential (primary) hypertension: Secondary | ICD-10-CM | POA: Diagnosis not present

## 2023-09-01 DIAGNOSIS — E119 Type 2 diabetes mellitus without complications: Secondary | ICD-10-CM | POA: Diagnosis not present

## 2023-09-01 DIAGNOSIS — E7800 Pure hypercholesterolemia, unspecified: Secondary | ICD-10-CM | POA: Diagnosis not present

## 2023-09-09 ENCOUNTER — Ambulatory Visit: Admitting: Podiatry

## 2023-09-09 ENCOUNTER — Encounter: Payer: Self-pay | Admitting: Podiatry

## 2023-09-09 DIAGNOSIS — E119 Type 2 diabetes mellitus without complications: Secondary | ICD-10-CM

## 2023-09-09 DIAGNOSIS — L84 Corns and callosities: Secondary | ICD-10-CM

## 2023-09-09 DIAGNOSIS — M5441 Lumbago with sciatica, right side: Secondary | ICD-10-CM | POA: Insufficient documentation

## 2023-09-09 DIAGNOSIS — M4306 Spondylolysis, lumbar region: Secondary | ICD-10-CM | POA: Insufficient documentation

## 2023-09-09 DIAGNOSIS — T148XXA Other injury of unspecified body region, initial encounter: Secondary | ICD-10-CM | POA: Insufficient documentation

## 2023-09-09 DIAGNOSIS — B351 Tinea unguium: Secondary | ICD-10-CM

## 2023-09-09 DIAGNOSIS — M79676 Pain in unspecified toe(s): Secondary | ICD-10-CM

## 2023-09-10 DIAGNOSIS — E210 Primary hyperparathyroidism: Secondary | ICD-10-CM | POA: Diagnosis not present

## 2023-09-10 DIAGNOSIS — R202 Paresthesia of skin: Secondary | ICD-10-CM | POA: Diagnosis not present

## 2023-09-10 DIAGNOSIS — M85839 Other specified disorders of bone density and structure, unspecified forearm: Secondary | ICD-10-CM | POA: Diagnosis not present

## 2023-09-14 NOTE — Progress Notes (Signed)
  Subjective:  Patient ID: Cynthia Torres, female    DOB: 09-08-1945,  MRN: [id number]  78 y.o. female presents to clinic with  preventative diabetic foot care and thick, elongated toenails of both feet which are tender when wearing enclosed shoe gear.  Chief Complaint  Patient presents with   Diabetes    DFC NIDDM A1C 6.4. Toenail trim. 0 pain.     New problem(s): None   PCP is Dwight Trula SQUIBB, MD. ARNETTA 03/21/2023.  Allergies  Allergen Reactions   Atorvastatin     Other reaction(s): myalgias    Review of Systems: Negative except as noted in the HPI.   Objective:  Cynthia Torres is a pleasant 78 y.o. female WD, WN in NAD. AAO x 3.  Vascular Examination: Vascular status intact b/l with palpable pedal pulses. CFT immediate b/l. No edema. No pain with calf compression b/l. Skin temperature gradient WNL b/l. Trace edema noted BLE.  Neurological Examination: Sensation grossly intact b/l with 10 gram monofilament. Vibratory sensation intact b/l.   Dermatological Examination: Pedal skin with normal turgor, texture and tone b/l. Toenails 1-5 b/l thick, discolored, elongated with subungual debris and pain on dorsal palpation. Hyperkeratotic lesion(s) medial DIPJ of left second digit.  No erythema, no edema, no drainage, no fluctuance.  Musculoskeletal Examination: Muscle strength 5/5 to b/l LE. HAV with bunion deformity noted b/l LE. Hammertoe(s) left second digit.  Radiographs: None  Last A1c:      Latest Ref Rng & Units 02/19/2023    6:23 PM  Hemoglobin A1C  Hemoglobin-A1c 4.8 - 5.6 % 5.6     Assessment:   1. Pain due to onychomycosis of toenail   2. Corns   3. Diabetes mellitus without complication (HCC)    Plan:  Consent given for treatment. Patient examined. All patient's and/or POA's questions/concerns addressed on today's visit. Mycotic toenails 1-5 debrided in length and girth without incident. Corn(s) medial DIPJ of left second digit pared with sharp debridement  without incident. Continue foot and shoe inspections daily. Monitor blood glucose per PCP/Endocrinologist's recommendations.Continue soft, supportive shoe gear daily. Report any pedal injuries to medical professional. Call office if there are any quesitons/concerns. Return in about 3 months (around 12/10/2023).  Delon LITTIE Merlin, DPM      [facility] LOCATION: [address] [place] [address]                   Office [phone]   [facility] LOCATION: [address] [place], [place] [address] Office [phone]

## 2023-10-02 DIAGNOSIS — E119 Type 2 diabetes mellitus without complications: Secondary | ICD-10-CM | POA: Diagnosis not present

## 2023-10-02 DIAGNOSIS — E7800 Pure hypercholesterolemia, unspecified: Secondary | ICD-10-CM | POA: Diagnosis not present

## 2023-10-02 DIAGNOSIS — I10 Essential (primary) hypertension: Secondary | ICD-10-CM | POA: Diagnosis not present

## 2023-10-15 DIAGNOSIS — K055 Other periodontal diseases: Secondary | ICD-10-CM | POA: Diagnosis not present

## 2023-10-15 DIAGNOSIS — M7989 Other specified soft tissue disorders: Secondary | ICD-10-CM | POA: Diagnosis not present

## 2023-10-15 DIAGNOSIS — M79606 Pain in leg, unspecified: Secondary | ICD-10-CM | POA: Diagnosis not present

## 2023-10-21 ENCOUNTER — Inpatient Hospital Stay: Attending: Hematology and Oncology

## 2023-10-21 ENCOUNTER — Telehealth: Payer: Self-pay | Admitting: *Deleted

## 2023-10-21 ENCOUNTER — Encounter: Payer: Self-pay | Admitting: Hematology and Oncology

## 2023-10-21 ENCOUNTER — Inpatient Hospital Stay: Admitting: Hematology and Oncology

## 2023-10-21 NOTE — Telephone Encounter (Signed)
 Letter mailed to pt regarding missed appt.

## 2023-11-02 DIAGNOSIS — E7800 Pure hypercholesterolemia, unspecified: Secondary | ICD-10-CM | POA: Diagnosis not present

## 2023-11-02 DIAGNOSIS — E119 Type 2 diabetes mellitus without complications: Secondary | ICD-10-CM | POA: Diagnosis not present

## 2023-11-02 DIAGNOSIS — I10 Essential (primary) hypertension: Secondary | ICD-10-CM | POA: Diagnosis not present

## 2023-11-05 DIAGNOSIS — M85839 Other specified disorders of bone density and structure, unspecified forearm: Secondary | ICD-10-CM | POA: Diagnosis not present

## 2023-11-05 DIAGNOSIS — R748 Abnormal levels of other serum enzymes: Secondary | ICD-10-CM | POA: Diagnosis not present

## 2023-11-05 DIAGNOSIS — E210 Primary hyperparathyroidism: Secondary | ICD-10-CM | POA: Diagnosis not present

## 2023-11-10 ENCOUNTER — Other Ambulatory Visit: Payer: Self-pay | Admitting: Internal Medicine

## 2023-11-10 DIAGNOSIS — Z1231 Encounter for screening mammogram for malignant neoplasm of breast: Secondary | ICD-10-CM

## 2023-11-13 DIAGNOSIS — I10 Essential (primary) hypertension: Secondary | ICD-10-CM | POA: Diagnosis not present

## 2023-11-13 DIAGNOSIS — D649 Anemia, unspecified: Secondary | ICD-10-CM | POA: Diagnosis not present

## 2023-11-13 DIAGNOSIS — E119 Type 2 diabetes mellitus without complications: Secondary | ICD-10-CM | POA: Diagnosis not present

## 2023-11-13 DIAGNOSIS — Z86718 Personal history of other venous thrombosis and embolism: Secondary | ICD-10-CM | POA: Diagnosis not present

## 2023-11-25 ENCOUNTER — Ambulatory Visit (INDEPENDENT_AMBULATORY_CARE_PROVIDER_SITE_OTHER): Admitting: Podiatry

## 2023-11-25 DIAGNOSIS — Z91199 Patient's noncompliance with other medical treatment and regimen due to unspecified reason: Secondary | ICD-10-CM

## 2023-11-25 NOTE — Progress Notes (Signed)
 1. No-show for appointment

## 2023-11-27 ENCOUNTER — Telehealth: Payer: Self-pay

## 2023-11-27 NOTE — Telephone Encounter (Signed)
 Attempted to contact patient at [phone] and [phone]; both numbers are not in service. Left a message with patient's daughter, [name], regarding an appointment with Dr. Lonn within the next 1-2 weeks. A scheduling message will be sent to the scheduler to arrange the appointment.  Later spoke with patient's son, Eva, who provided an updated cell phone number for patient: [phone].

## 2023-11-28 ENCOUNTER — Other Ambulatory Visit: Payer: Self-pay

## 2023-12-01 ENCOUNTER — Telehealth: Payer: Self-pay

## 2023-12-01 NOTE — Telephone Encounter (Signed)
 Called and scheduled appt on 10/2 at 3 pm, daughter is aware of appt.

## 2023-12-01 NOTE — Telephone Encounter (Signed)
 Called and left a message for daughter offering appt from Ms. Josten. Ask her to call the office back to schedule.

## 2023-12-02 DIAGNOSIS — E7800 Pure hypercholesterolemia, unspecified: Secondary | ICD-10-CM | POA: Diagnosis not present

## 2023-12-02 DIAGNOSIS — E119 Type 2 diabetes mellitus without complications: Secondary | ICD-10-CM | POA: Diagnosis not present

## 2023-12-02 DIAGNOSIS — I10 Essential (primary) hypertension: Secondary | ICD-10-CM | POA: Diagnosis not present

## 2023-12-04 ENCOUNTER — Inpatient Hospital Stay: Attending: Hematology and Oncology | Admitting: Hematology and Oncology

## 2023-12-04 ENCOUNTER — Encounter: Payer: Self-pay | Admitting: Hematology and Oncology

## 2023-12-04 VITALS — BP 141/59 | HR 64 | Temp 98.3°F | Resp 18 | Ht 59.0 in

## 2023-12-04 DIAGNOSIS — Z86711 Personal history of pulmonary embolism: Secondary | ICD-10-CM | POA: Diagnosis not present

## 2023-12-04 DIAGNOSIS — Z86718 Personal history of other venous thrombosis and embolism: Secondary | ICD-10-CM | POA: Insufficient documentation

## 2023-12-04 DIAGNOSIS — R269 Unspecified abnormalities of gait and mobility: Secondary | ICD-10-CM | POA: Insufficient documentation

## 2023-12-04 DIAGNOSIS — R600 Localized edema: Secondary | ICD-10-CM | POA: Insufficient documentation

## 2023-12-04 DIAGNOSIS — Z79899 Other long term (current) drug therapy: Secondary | ICD-10-CM | POA: Insufficient documentation

## 2023-12-04 DIAGNOSIS — I82412 Acute embolism and thrombosis of left femoral vein: Secondary | ICD-10-CM | POA: Insufficient documentation

## 2023-12-04 DIAGNOSIS — Z8249 Family history of ischemic heart disease and other diseases of the circulatory system: Secondary | ICD-10-CM | POA: Insufficient documentation

## 2023-12-04 MED ORDER — APIXABAN 5 MG PO TABS: 5.0000 mg | ORAL_TABLET | Freq: Two times a day (BID) | ORAL | 11 refills | Status: AC

## 2023-12-04 NOTE — Progress Notes (Signed)
 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 Her most recent episode of DVT was clearly provoked after recent surgery to the left hip She continues to have abnormal gait and reduced mobility; she has strong family history of DVT of which her sister have IVC filter placement Overall, her risk of recurrent DVT would be very high and I do not advocate for her to stop after completion of 3 months of anticoagulation therapy We discussed risk factors associated with recurrent blood clots Even though her mobility has improved, her leg swelling has not Based on appearance and skin changes, I suspect the patient have chronic lymphedema associated with poor circulation I recommend the patient to continue on reduced dose of Eliquis  to 2.5 mg twice daily for secondary prevention I refilled her prescription and we will see her once a year  No orders of the defined types were placed in this encounter.    Cynthia Bedford, MD  INTERVAL HISTORY: she returns for surveillance follow-up for history of DVT/PE Patient denies recent bleeding such as epistaxis, hematuria or hematochezia She complained of chronic persistent bilateral lower extremity edema, left greater than right Her mobility may improve a little bit since last time I saw her and she is drinking more water  We discussed future plan of care as outlined above  PHYSICAL EXAMINATION: ECOG PERFORMANCE STATUS: 1 - Symptomatic but completely ambulatory  Vitals:   12/04/23 1514  BP: (!) 141/59  Pulse: 64  Resp: 18  Temp: 98.3 F (36.8 C)  SpO2: 100%   Lab Results  Component Value Date   WBC 7.1 05/20/2023   HGB 11.3 (L) 05/20/2023   HCT 37.3 05/20/2023   MCV 94.9 05/20/2023   PLT 254 05/20/2023    SUMMARY OF  HEMATOLOGIC HISTORY:  Cynthia Torres 78 y.o. female is here because of recent diagnosis of recurrent left lower extremity DVT She is here accompanied by her daughter, [name] She has 3 children, retired She was recently found to have recurrent acute left lower extremity DVT She has remote history of DVT in 2017 The circumstances leading to her first DVT diagnosis was unknown and the patient cannot recall the details On August 28, 2015, acute left lower extremity venous Doppler ultrasound revealed occlusive thrombus within the left popliteal vein.  She was anticoagulated. On Jul 19, 2016, repeat venous Doppler ultrasound showed no evidence of DVT.  She had numerous other evaluations, including another ultrasound venous Doppler on October 09, 2016, September 30, 2018, June 15, 2021 and June 20,2023 of which all venous Doppler ultrasounds were negative for DVT In December 2024, she underwent spinal fusion surgery  Since then, her mobility has not been good She fell at the end of January and have difficulties walking.  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  Upon discharge, she was placed on aspirin  therapy for DVT prophylaxis She presented with significant swelling and pain on the left lower extremity  a month later On May 20, 2023, repeat venous Doppler ultrasound showed findings consistent with acute deep vein thrombosis involving the left, common femoral vein, left femoral vein, and left popliteal vein.  CT angiogram showed no  evidence of pulmonary embolus but there are signs of pulmonary artery hypertension and cardiomegaly She is placed on anticoagulation therapy and she tolerated well without major bleeding She continues to have profound lower extremity edema but denies pain. She denies recent chest pain on exertion, shortness of breath on minimal exertion, pre-syncopal episodes,  hemoptysis, or palpitation. Her mobility remain poor She is going to start physical therapy soon She is noted to have hypercalcemia on blood work in March and has appointment to see endocrinologist tomorrow for evaluation of hypercalcemia She had no prior history or diagnosis of cancer. Her age appropriate screening programs are up-to-date. She had prior surgeries before and never had perioperative thromboembolic events. The patient has never taken birth control pill or hormone replacement therapy The patient had been pregnant before and denies history of peripartum thromboembolic event  There is strong family history of blood clot in her sister who has an IVC filter placed

## 2023-12-04 NOTE — Assessment & Plan Note (Addendum)
 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 Her most recent episode of DVT was clearly provoked after recent surgery to the left hip She continues to have abnormal gait and reduced mobility; she has strong family history of DVT of which her sister have IVC filter placement Overall, her risk of recurrent DVT would be very high and I do not advocate for her to stop after completion of 3 months of anticoagulation therapy We discussed risk factors associated with recurrent blood clots Even though her mobility has improved, her leg swelling has not Based on appearance and skin changes, I suspect the patient have chronic lymphedema associated with poor circulation I recommend the patient to continue on reduced dose of Eliquis  to 2.5 mg twice daily for secondary prevention I refilled her prescription and we will see her once a year

## 2023-12-15 ENCOUNTER — Ambulatory Visit
Admission: RE | Admit: 2023-12-15 | Discharge: 2023-12-15 | Disposition: A | Discharge status: Home / Self Care | Source: Ambulatory Visit | Attending: Internal Medicine | Admitting: Internal Medicine

## 2023-12-15 DIAGNOSIS — Z1231 Encounter for screening mammogram for malignant neoplasm of breast: Secondary | ICD-10-CM

## 2023-12-19 ENCOUNTER — Other Ambulatory Visit: Payer: Self-pay | Admitting: Internal Medicine

## 2023-12-19 DIAGNOSIS — R202 Paresthesia of skin: Secondary | ICD-10-CM | POA: Diagnosis not present

## 2023-12-19 DIAGNOSIS — R200 Anesthesia of skin: Secondary | ICD-10-CM | POA: Diagnosis not present

## 2023-12-19 DIAGNOSIS — M79642 Pain in left hand: Secondary | ICD-10-CM | POA: Diagnosis not present

## 2023-12-19 DIAGNOSIS — R928 Other abnormal and inconclusive findings on diagnostic imaging of breast: Secondary | ICD-10-CM

## 2023-12-19 DIAGNOSIS — M79641 Pain in right hand: Secondary | ICD-10-CM | POA: Diagnosis not present

## 2023-12-23 ENCOUNTER — Ambulatory Visit
Admission: RE | Admit: 2023-12-23 | Discharge: 2023-12-23 | Disposition: A | Discharge status: Home / Self Care | Source: Ambulatory Visit | Attending: Internal Medicine | Admitting: Internal Medicine

## 2023-12-23 ENCOUNTER — Encounter

## 2023-12-23 DIAGNOSIS — R928 Other abnormal and inconclusive findings on diagnostic imaging of breast: Secondary | ICD-10-CM | POA: Diagnosis not present

## 2023-12-24 ENCOUNTER — Other Ambulatory Visit: Payer: Self-pay | Admitting: Internal Medicine

## 2023-12-24 DIAGNOSIS — R921 Mammographic calcification found on diagnostic imaging of breast: Secondary | ICD-10-CM

## 2023-12-25 ENCOUNTER — Ambulatory Visit
Admission: RE | Admit: 2023-12-25 | Discharge: 2023-12-25 | Disposition: A | Source: Ambulatory Visit | Attending: Internal Medicine | Admitting: Internal Medicine

## 2023-12-25 ENCOUNTER — Ambulatory Visit
Admission: RE | Admit: 2023-12-25 | Discharge: 2023-12-25 | Disposition: A | Discharge status: Home / Self Care | Source: Ambulatory Visit | Attending: Internal Medicine | Admitting: Internal Medicine

## 2023-12-25 DIAGNOSIS — R921 Mammographic calcification found on diagnostic imaging of breast: Secondary | ICD-10-CM

## 2023-12-25 DIAGNOSIS — N6011 Diffuse cystic mastopathy of right breast: Secondary | ICD-10-CM | POA: Diagnosis not present

## 2023-12-25 HISTORY — PX: BREAST BIOPSY: SHX20

## 2023-12-26 LAB — SURGICAL PATHOLOGY

## 2024-01-09 ENCOUNTER — Ambulatory Visit: Payer: Self-pay | Admitting: Surgery

## 2024-01-09 DIAGNOSIS — N6091 Unspecified benign mammary dysplasia of right breast: Secondary | ICD-10-CM

## 2024-01-12 ENCOUNTER — Telehealth: Payer: Self-pay

## 2024-01-12 NOTE — Telephone Encounter (Signed)
 Called and left a message asking her to call the office back to schedule appt with Dr. Lonn. [facility] needs medical clearance from Dr. Lonn for upcoming surgery.

## 2024-01-13 NOTE — Telephone Encounter (Signed)
 She called back. Appt scheduled with Dr. Lonn on 11/17. She is aware of appt.

## 2024-01-19 ENCOUNTER — Encounter: Payer: Self-pay | Admitting: Hematology and Oncology

## 2024-01-19 ENCOUNTER — Inpatient Hospital Stay: Attending: Hematology and Oncology | Admitting: Hematology and Oncology

## 2024-01-19 VITALS — BP 162/62 | HR 60 | Temp 97.9°F | Resp 18 | Ht 59.0 in | Wt 168.6 lb

## 2024-01-19 DIAGNOSIS — Z86718 Personal history of other venous thrombosis and embolism: Secondary | ICD-10-CM | POA: Insufficient documentation

## 2024-01-19 DIAGNOSIS — I82412 Acute embolism and thrombosis of left femoral vein: Secondary | ICD-10-CM | POA: Diagnosis present

## 2024-01-19 DIAGNOSIS — Z8249 Family history of ischemic heart disease and other diseases of the circulatory system: Secondary | ICD-10-CM | POA: Diagnosis not present

## 2024-01-19 DIAGNOSIS — N6099 Unspecified benign mammary dysplasia of unspecified breast: Secondary | ICD-10-CM | POA: Diagnosis not present

## 2024-01-19 DIAGNOSIS — R269 Unspecified abnormalities of gait and mobility: Secondary | ICD-10-CM | POA: Insufficient documentation

## 2024-01-19 DIAGNOSIS — Z79899 Other long term (current) drug therapy: Secondary | ICD-10-CM | POA: Diagnosis not present

## 2024-01-19 NOTE — Assessment & Plan Note (Addendum)
 The patient had history of recurrent DVT of the left lower extremity Her most recent episode of DVT was clearly provoked after recent surgery to the left hip She continues to have abnormal gait and reduced mobility; she has strong family history of DVT of which her sister have IVC filter placement Overall, her risk of recurrent DVT would be very high and I do not advocate for her to stop after completion of 3 months of anticoagulation therapy Even though her mobility has improved, her leg swelling has not Based on appearance and skin changes, I suspect the patient have chronic lymphedema associated with poor circulation She will continue on reduced dose of Eliquis  to 2.5 mg twice daily for secondary prevention

## 2024-01-19 NOTE — Progress Notes (Signed)
 [facility] OFFICE PROGRESS NOTE  Patient Care Team: Dwight Trula SQUIBB, MD as PCP - General (Internal Medicine)  Assessment & Plan Acute deep vein thrombosis (DVT) of femoral vein of left lower extremity (HCC) The patient had history of recurrent DVT of the left lower extremity Her most recent episode of DVT was clearly provoked after recent surgery to the left hip She continues to have abnormal gait and reduced mobility; she has strong family history of DVT of which her sister have IVC filter placement Overall, her risk of recurrent DVT would be very high and I do not advocate for her to stop after completion of 3 months of anticoagulation therapy Even though her mobility has improved, her leg swelling has not Based on appearance and skin changes, I suspect the patient have chronic lymphedema associated with poor circulation She will continue on reduced dose of Eliquis  to 2.5 mg twice daily for secondary prevention Atypical ductal hyperplasia of breast She was noted to have recent abnormal mammogram leading to biopsy which showed atypical ductal hyperplasia She is recommended to undergo lumpectomy surgery I gave the patient written and verbal instruction to discontinue Eliquis  2 days before surgery and to resume 1 day after surgery  No orders of the defined types were placed in this encounter.    Almarie Bedford, MD  INTERVAL HISTORY: she returns for preoperative surgical clearance prior to lumpectomy.  She was found to have atypical ductal hyperplasia on recent breast biopsy She is doing well with anticoagulation therapy without bleeding complications since I saw her  PHYSICAL EXAMINATION: ECOG PERFORMANCE STATUS: 0 - Asymptomatic  Vitals:   01/19/24 1047  BP: (!) 162/62  Pulse: 60  Resp: 18  Temp: 97.9 F (36.6 C)  SpO2: 100%   Filed Weights   01/19/24 1047  Weight: 168 lb 9.6 oz (76.5 kg)    Relevant data reviewed during this visit included mammogram report,  pathology report

## 2024-01-19 NOTE — Assessment & Plan Note (Addendum)
 She was noted to have recent abnormal mammogram leading to biopsy which showed atypical ductal hyperplasia She is recommended to undergo lumpectomy surgery I gave the patient written and verbal instruction to discontinue Eliquis  2 days before surgery and to resume 1 day after surgery

## 2024-01-22 ENCOUNTER — Other Ambulatory Visit: Payer: Self-pay | Admitting: Surgery

## 2024-01-22 DIAGNOSIS — N6091 Unspecified benign mammary dysplasia of right breast: Secondary | ICD-10-CM

## 2024-02-11 ENCOUNTER — Encounter: Payer: Self-pay | Admitting: Podiatry

## 2024-02-11 ENCOUNTER — Ambulatory Visit: Admitting: Podiatry

## 2024-02-11 DIAGNOSIS — B351 Tinea unguium: Secondary | ICD-10-CM | POA: Diagnosis not present

## 2024-02-11 DIAGNOSIS — L84 Corns and callosities: Secondary | ICD-10-CM | POA: Diagnosis not present

## 2024-02-11 DIAGNOSIS — M79676 Pain in unspecified toe(s): Secondary | ICD-10-CM

## 2024-02-11 DIAGNOSIS — E119 Type 2 diabetes mellitus without complications: Secondary | ICD-10-CM | POA: Diagnosis not present

## 2024-02-20 ENCOUNTER — Encounter: Payer: Self-pay | Admitting: Podiatry

## 2024-02-20 DIAGNOSIS — M7062 Trochanteric bursitis, left hip: Secondary | ICD-10-CM | POA: Insufficient documentation

## 2024-02-20 DIAGNOSIS — M47817 Spondylosis without myelopathy or radiculopathy, lumbosacral region: Secondary | ICD-10-CM | POA: Insufficient documentation

## 2024-02-20 DIAGNOSIS — Z79891 Long term (current) use of opiate analgesic: Secondary | ICD-10-CM | POA: Insufficient documentation

## 2024-02-20 DIAGNOSIS — M79609 Pain in unspecified limb: Secondary | ICD-10-CM | POA: Insufficient documentation

## 2024-02-20 DIAGNOSIS — M25559 Pain in unspecified hip: Secondary | ICD-10-CM | POA: Insufficient documentation

## 2024-02-20 NOTE — Progress Notes (Signed)
"  °  Subjective:  Patient ID: Cynthia Torres, female    DOB: 10-07-1945,  MRN: [id number]  Cynthia Torres presents to clinic today for preventative diabetic foot care and corn(s) L 2nd toe and painful mycotic toenails that are difficult to trim. Painful toenails interfere with ambulation. Aggravating factors include wearing enclosed shoe gear. Pain is relieved with periodic professional debridement. Painful corns are aggravated when weightbearing when wearing enclosed shoe gear. Pain is relieved with periodic professional debridement.  Chief Complaint  Patient presents with   [facility]    Rm16 Diabetic foot care/ A1c 6/ Dr. Trula Brim last visit July 30,2025   New problem(s): None.   PCP is Brim Trula SQUIBB, MD.  Allergies[1]  Review of Systems: Negative except as noted in the HPI.  Objective: No changes noted in today's physical examination. There were no vitals filed for this visit. Cynthia Torres is a pleasant 78 y.o. female WD, WN in NAD. AAO x 3.  Vascular Examination: Vascular status intact b/l with palpable pedal pulses. CFT immediate b/l. No edema. No pain with calf compression b/l. Skin temperature gradient WNL b/l. Trace edema noted BLE.  Neurological Examination: Sensation grossly intact b/l with 10 gram monofilament. Vibratory sensation intact b/l.   Dermatological Examination: Pedal skin with normal turgor, texture and tone b/l. Toenails 1-5 b/l thick, discolored, elongated with subungual debris and pain on dorsal palpation. Hyperkeratotic lesion(s) medial DIPJ of left second digit.  No erythema, no edema, no drainage, no fluctuance.  Musculoskeletal Examination: Muscle strength 5/5 to b/l LE. HAV with bunion deformity noted b/l LE. Hammertoe(s) left second digit.  Radiographs: None  Assessment/Plan: 1. Pain due to onychomycosis of toenail   2. Corns   3. Diabetes mellitus without complication (HCC)   -Patient was evaluated today. All questions/concerns addressed on today's  visit. -Patient to continue soft, supportive shoe gear daily. -Mycotic toenails 1-5 right foot, R hallux, R 3rd toe, R 4th toe, and R 5th toe were debrided in length and girth with sterile nail nippers and dremel without iatrogenic bleeding. -Hyperkeratotic lesion(s) DIPJ of L 2nd toe debrided with dremel bur without incident. Total number debrided=1. -Patient/POA to call should there be question/concern in the interim.   Return in about 3 months (around 05/11/2024).  Delon LITTIE Merlin, DPM      [place] LOCATION: [address] [place] [address]                   Office [phone]   [place] LOCATION: [address] [place], [place] [address] Office [phone]     [1]  Allergies Allergen Reactions   Atorvastatin     Other reaction(s): myalgias   "

## 2024-02-24 ENCOUNTER — Ambulatory Visit: Admitting: Podiatry

## 2024-03-11 ENCOUNTER — Encounter (HOSPITAL_BASED_OUTPATIENT_CLINIC_OR_DEPARTMENT_OTHER): Payer: Self-pay | Admitting: Surgery

## 2024-03-15 ENCOUNTER — Encounter (HOSPITAL_BASED_OUTPATIENT_CLINIC_OR_DEPARTMENT_OTHER)
Admission: RE | Admit: 2024-03-15 | Discharge: 2024-03-15 | Disposition: A | Source: Ambulatory Visit | Attending: Surgery

## 2024-03-15 DIAGNOSIS — Z01818 Encounter for other preprocedural examination: Secondary | ICD-10-CM | POA: Diagnosis present

## 2024-03-15 LAB — BASIC METABOLIC PANEL WITH GFR
Anion gap: 11 (ref 5–15)
BUN: 21 mg/dL (ref 8–23)
CO2: 24 mmol/L (ref 22–32)
Calcium: 11.3 mg/dL — ABNORMAL HIGH (ref 8.9–10.3)
Chloride: 104 mmol/L (ref 98–111)
Creatinine, Ser: 0.82 mg/dL (ref 0.44–1.00)
GFR, Estimated: >60 mL/min
Glucose, Bld: 84 mg/dL (ref 70–99)
Potassium: 4.2 mmol/L (ref 3.5–5.1)
Sodium: 139 mmol/L (ref 135–145)

## 2024-03-15 MED ORDER — CHLORHEXIDINE GLUCONATE CLOTH 2 % EX PADS: 6.0000 | MEDICATED_PAD | Freq: Once | CUTANEOUS | Status: DC

## 2024-03-15 NOTE — Progress Notes (Signed)

## 2024-03-16 ENCOUNTER — Inpatient Hospital Stay
Admission: RE | Admit: 2024-03-16 | Discharge: 2024-03-16 | Disposition: A | Source: Ambulatory Visit | Attending: Surgery | Admitting: Surgery

## 2024-03-16 DIAGNOSIS — N6091 Unspecified benign mammary dysplasia of right breast: Secondary | ICD-10-CM

## 2024-03-16 HISTORY — PX: BREAST BIOPSY: SHX20

## 2024-03-18 ENCOUNTER — Other Ambulatory Visit: Payer: Self-pay

## 2024-03-18 ENCOUNTER — Encounter (HOSPITAL_BASED_OUTPATIENT_CLINIC_OR_DEPARTMENT_OTHER): Admission: RE | Disposition: A | Payer: Self-pay | Attending: Surgery

## 2024-03-18 ENCOUNTER — Encounter (HOSPITAL_BASED_OUTPATIENT_CLINIC_OR_DEPARTMENT_OTHER): Payer: Self-pay | Admitting: Surgery

## 2024-03-18 ENCOUNTER — Ambulatory Visit (HOSPITAL_BASED_OUTPATIENT_CLINIC_OR_DEPARTMENT_OTHER): Admitting: Anesthesiology

## 2024-03-18 ENCOUNTER — Ambulatory Visit (HOSPITAL_BASED_OUTPATIENT_CLINIC_OR_DEPARTMENT_OTHER)
Admission: RE | Admit: 2024-03-18 | Discharge: 2024-03-18 | Disposition: A | Discharge status: Home / Self Care | Attending: Surgery | Admitting: Surgery

## 2024-03-18 ENCOUNTER — Inpatient Hospital Stay: Admission: RE | Admit: 2024-03-18 | Discharge: 2024-03-18 | Attending: Surgery | Admitting: Surgery

## 2024-03-18 DIAGNOSIS — N641 Fat necrosis of breast: Secondary | ICD-10-CM | POA: Diagnosis not present

## 2024-03-18 DIAGNOSIS — N6081 Other benign mammary dysplasias of right breast: Secondary | ICD-10-CM

## 2024-03-18 DIAGNOSIS — D0511 Intraductal carcinoma in situ of right breast: Secondary | ICD-10-CM | POA: Diagnosis not present

## 2024-03-18 DIAGNOSIS — Z79899 Other long term (current) drug therapy: Secondary | ICD-10-CM | POA: Insufficient documentation

## 2024-03-18 DIAGNOSIS — Z01818 Encounter for other preprocedural examination: Secondary | ICD-10-CM

## 2024-03-18 DIAGNOSIS — D241 Benign neoplasm of right breast: Secondary | ICD-10-CM | POA: Insufficient documentation

## 2024-03-18 DIAGNOSIS — N6091 Unspecified benign mammary dysplasia of right breast: Secondary | ICD-10-CM | POA: Diagnosis present

## 2024-03-18 DIAGNOSIS — Z7984 Long term (current) use of oral hypoglycemic drugs: Secondary | ICD-10-CM | POA: Insufficient documentation

## 2024-03-18 DIAGNOSIS — E119 Type 2 diabetes mellitus without complications: Secondary | ICD-10-CM | POA: Diagnosis not present

## 2024-03-18 DIAGNOSIS — I10 Essential (primary) hypertension: Secondary | ICD-10-CM | POA: Insufficient documentation

## 2024-03-18 HISTORY — PX: BREAST LUMPECTOMY WITH RADIOACTIVE SEED LOCALIZATION: SHX6424

## 2024-03-18 LAB — GLUCOSE, CAPILLARY
Glucose-Capillary: 109 mg/dL — ABNORMAL HIGH (ref 70–99)
Glucose-Capillary: 106 mg/dL — ABNORMAL HIGH (ref 70–99)

## 2024-03-18 MED ORDER — EPHEDRINE 5 MG/ML INJ
INTRAVENOUS | Status: AC
  Filled 2024-03-18: qty 5

## 2024-03-18 MED ORDER — CEFAZOLIN SODIUM-DEXTROSE 2-4 GM/100ML-% IV SOLN
2.0000 g | INTRAVENOUS | Status: AC
  Administered 2024-03-18: 2 g via INTRAVENOUS

## 2024-03-18 MED ORDER — FENTANYL CITRATE (PF) 100 MCG/2ML IJ SOLN
INTRAMUSCULAR | Status: DC | PRN
  Administered 2024-03-18: 50 ug via INTRAVENOUS
  Administered 2024-03-18: 25 ug via INTRAVENOUS

## 2024-03-18 MED ORDER — LACTATED RINGERS IV SOLN: INTRAVENOUS | Status: DC

## 2024-03-18 MED ORDER — PHENYLEPHRINE 80 MCG/ML (10ML) SYRINGE FOR IV PUSH (FOR BLOOD PRESSURE SUPPORT)
PREFILLED_SYRINGE | INTRAVENOUS | Status: AC
  Filled 2024-03-18: qty 10

## 2024-03-18 MED ORDER — PHENYLEPHRINE HCL (PRESSORS) 10 MG/ML IV SOLN
INTRAVENOUS | Status: DC | PRN
  Administered 2024-03-18 (×2): 80 ug via INTRAVENOUS

## 2024-03-18 MED ORDER — DEXAMETHASONE SOD PHOSPHATE PF 10 MG/ML IJ SOLN
INTRAMUSCULAR | Status: AC
  Filled 2024-03-18: qty 1

## 2024-03-18 MED ORDER — PROPOFOL 10 MG/ML IV BOLUS
INTRAVENOUS | Status: AC
  Filled 2024-03-18: qty 20

## 2024-03-18 MED ORDER — EPHEDRINE SULFATE (PRESSORS) 25 MG/5ML IV SOSY
PREFILLED_SYRINGE | INTRAVENOUS | Status: DC | PRN
  Administered 2024-03-18 (×2): 10 mg via INTRAVENOUS

## 2024-03-18 MED ORDER — ACETAMINOPHEN 500 MG PO TABS: 1000.0000 mg | ORAL_TABLET | Freq: Once | ORAL | Status: DC

## 2024-03-18 MED ORDER — OXYCODONE HCL 5 MG PO TABS: 5.0000 mg | ORAL_TABLET | Freq: Four times a day (QID) | ORAL | 0 refills | Status: AC | PRN

## 2024-03-18 MED ORDER — 0.9 % SODIUM CHLORIDE (POUR BTL) OPTIME
TOPICAL | Status: DC | PRN
  Administered 2024-03-18: 1000 mL

## 2024-03-18 MED ORDER — ONDANSETRON HCL 4 MG/2ML IJ SOLN
INTRAMUSCULAR | Status: DC | PRN
  Administered 2024-03-18: 4 mg via INTRAVENOUS

## 2024-03-18 MED ORDER — BUPIVACAINE-EPINEPHRINE (PF) 0.25% -1:200000 IJ SOLN
INTRAMUSCULAR | Status: AC
  Filled 2024-03-18: qty 60

## 2024-03-18 MED ORDER — LIDOCAINE 2% (20 MG/ML) 5 ML SYRINGE
INTRAMUSCULAR | Status: AC
  Filled 2024-03-18: qty 5

## 2024-03-18 MED ORDER — BUPIVACAINE-EPINEPHRINE (PF) 0.25% -1:200000 IJ SOLN
INTRAMUSCULAR | Status: DC | PRN
  Administered 2024-03-18: 17 mL

## 2024-03-18 MED ORDER — ACETAMINOPHEN 500 MG PO TABS
ORAL_TABLET | ORAL | Status: AC
  Filled 2024-03-18: qty 2

## 2024-03-18 MED ORDER — FENTANYL CITRATE (PF) 100 MCG/2ML IJ SOLN
INTRAMUSCULAR | Status: AC
  Filled 2024-03-18: qty 2

## 2024-03-18 MED ORDER — FENTANYL CITRATE (PF) 100 MCG/2ML IJ SOLN
25.0000 ug | INTRAMUSCULAR | Status: DC | PRN
  Administered 2024-03-18 (×2): 50 ug via INTRAVENOUS

## 2024-03-18 MED ORDER — LIDOCAINE HCL (CARDIAC) PF 100 MG/5ML IV SOSY
PREFILLED_SYRINGE | INTRAVENOUS | Status: DC | PRN
  Administered 2024-03-18: 60 mg via INTRAVENOUS

## 2024-03-18 MED ORDER — CEFAZOLIN SODIUM-DEXTROSE 2-4 GM/100ML-% IV SOLN
INTRAVENOUS | Status: AC
  Filled 2024-03-18: qty 100

## 2024-03-18 MED ORDER — ONDANSETRON HCL 4 MG/2ML IJ SOLN
INTRAMUSCULAR | Status: AC
  Filled 2024-03-18: qty 2

## 2024-03-18 MED ORDER — ACETAMINOPHEN 500 MG PO TABS
1000.0000 mg | ORAL_TABLET | ORAL | Status: AC
  Administered 2024-03-18: 1000 mg via ORAL

## 2024-03-18 MED ORDER — PROPOFOL 10 MG/ML IV BOLUS
INTRAVENOUS | Status: DC | PRN
  Administered 2024-03-18: 100 mg via INTRAVENOUS

## 2024-03-18 MED ORDER — DEXAMETHASONE SOD PHOSPHATE PF 10 MG/ML IJ SOLN
INTRAMUSCULAR | Status: DC | PRN
  Administered 2024-03-18: 5 mg via INTRAVENOUS

## 2024-03-18 NOTE — Anesthesia Postprocedure Evaluation (Signed)
"   Anesthesia Post Note  Patient: Cynthia Torres  Procedure(s) Performed: BREAST LUMPECTOMY WITH RADIOACTIVE SEED LOCALIZATION (Right: Breast)     Patient location during evaluation: PACU Anesthesia Type: General Level of consciousness: awake and alert Pain management: pain level controlled Vital Signs Assessment: post-procedure vital signs reviewed and stable Respiratory status: spontaneous breathing, nonlabored ventilation and respiratory function stable Cardiovascular status: blood pressure returned to baseline and stable Postop Assessment: no apparent nausea or vomiting Anesthetic complications: no   No notable events documented.  Last Vitals:  Vitals:   03/18/24 1445 03/18/24 1530  BP: 132/67 (!) 146/87  Pulse: (!) 56 (!) 57  Resp: (!) 9 15  Temp:  (!) 36.3 C  SpO2: 99% 94%    Last Pain:  Vitals:   03/18/24 1530  TempSrc:   PainSc: 0-No pain                 Clyda Smyth,W. EDMOND      "

## 2024-03-18 NOTE — Interval H&P Note (Signed)
 History and Physical Interval Note:  03/18/2024 12:51 PM  Cynthia Torres  has presented today for surgery, with the diagnosis of RIGHT BREAST ATYPICAL DUCTAL HYPERPLASIA.  The various methods of treatment have been discussed with the patient and family. After consideration of risks, benefits and other options for treatment, the patient has consented to  Procedures: BREAST LUMPECTOMY WITH RADIOACTIVE SEED LOCALIZATION (Right) as a surgical intervention.  The patient's history has been reviewed, patient examined, no change in status, stable for surgery.  I have reviewed the patient's chart and labs.  Questions were answered to the patient's satisfaction.     Bernardo Brayman A Shelene Krage

## 2024-03-18 NOTE — Anesthesia Procedure Notes (Signed)
 Procedure Name: LMA Insertion Date/Time: 03/18/2024 1:22 PM  Performed by: Frost Kayla MATSU, CRNAPre-anesthesia Checklist: Patient identified, Emergency Drugs available, Suction available and Patient being monitored Patient Re-evaluated:Patient Re-evaluated prior to induction Oxygen Delivery Method: Circle system utilized Preoxygenation: Pre-oxygenation with 100% oxygen LMA: LMA inserted LMA Size: 4.0 Number of attempts: 1 Placement Confirmation: positive ETCO2 Tube secured with: Tape Dental Injury: Teeth and Oropharynx as per pre-operative assessment

## 2024-03-18 NOTE — Discharge Instructions (Addendum)
 [facility] Office Phone Number [phone]  BREAST BIOPSY/ PARTIAL MASTECTOMY: POST OP INSTRUCTIONS  Always review your discharge instruction sheet given to you by the facility where your surgery was performed.  IF YOU HAVE DISABILITY OR FAMILY LEAVE FORMS, YOU MUST BRING THEM TO THE OFFICE FOR PROCESSING.  DO NOT GIVE THEM TO YOUR DOCTOR.  A prescription for pain medication may be given to you upon discharge.  Take your pain medication as prescribed, if needed.  If narcotic pain medicine is not needed, then you may take acetaminophen  (Tylenol ) or ibuprofen (Advil) as needed. Take your usually prescribed medications unless otherwise directed If you need a refill on your pain medication, please contact your pharmacy.  They will contact our office to request authorization.  Prescriptions will not be filled after 5pm or on week-ends. You should eat very light the first 24 hours after surgery, such as soup, crackers, pudding, etc.  Resume your normal diet the day after surgery. Most patients will experience some swelling and bruising in the breast.  Ice packs and a good support bra will help.  Swelling and bruising can take several days to resolve.  It is common to experience some constipation if taking pain medication after surgery.  Increasing fluid intake and taking a stool softener will usually help or prevent this problem from occurring.  A mild laxative (Milk of Magnesia or Miralax ) should be taken according to package directions if there are no bowel movements after 48 hours. Unless discharge instructions indicate otherwise, you may remove your bandages 24-48 hours after surgery, and you may shower at that time.  You may have steri-strips (small skin tapes) in place directly over the incision.  These strips should be left on the skin for 7-10 days.  If your surgeon used skin glue on the incision, you may shower in 24 hours.  The glue will flake off over the next 2-3 weeks.  Any  sutures or staples will be removed at the office during your follow-up visit. ACTIVITIES:  You may resume regular daily activities (gradually increasing) beginning the next day.  Wearing a good support bra or sports bra minimizes pain and swelling.  You may have sexual intercourse when it is comfortable. You may drive when you no longer are taking prescription pain medication, you can comfortably wear a seatbelt, and you can safely maneuver your car and apply brakes. RETURN TO WORK:  ______________________________________________________________________________________ Rosine should see your doctor in the office for a follow-up appointment approximately two weeks after your surgery.  Your doctors nurse will typically make your follow-up appointment when she calls you with your pathology report.  Expect your pathology report 2-3 business days after your surgery.  You may call to check if you do not hear from us  after three days. OTHER INSTRUCTIONS: _______________________________________________________________________________________________ _____________________________________________________________________________________________________________________________________ _____________________________________________________________________________________________________________________________________ _____________________________________________________________________________________________________________________________________  WHEN TO CALL YOUR DOCTOR: Fever over 101.0 Nausea and/or vomiting. Extreme swelling or bruising. Continued bleeding from incision. Increased pain, redness, or drainage from the incision.  The clinic staff is available to answer your questions during regular business hours.  Please dont hesitate to call and ask to speak to one of the nurses for clinical concerns.  If you have a medical emergency, go to the nearest emergency room or call 911.  A surgeon from [facility] is always on call at the hospital.  For further questions, please visit [email or web address]    No Tylenol  until after 5:13pm today if needed   Post Anesthesia Home Care Instructions  Activity: Get plenty of rest for the remainder of the day. A responsible individual must stay with you for 24 hours following the procedure.  For the next 24 hours, DO NOT: -Drive a car -Advertising copywriter -Drink alcoholic beverages -Take any medication unless instructed by your physician -Make any legal decisions or sign important papers.  Meals: Start with liquid foods such as gelatin or soup. Progress to regular foods as tolerated. Avoid greasy, spicy, heavy foods. If nausea and/or vomiting occur, drink only clear liquids until the nausea and/or vomiting subsides. Call your physician if vomiting continues.  Special Instructions/Symptoms: Your throat may feel dry or sore from the anesthesia or the breathing tube placed in your throat during surgery. If this causes discomfort, gargle with warm salt water . The discomfort should disappear within 24 hours.  If you had a scopolamine patch placed behind your ear for the management of post- operative nausea and/or vomiting:  1. The medication in the patch is effective for 72 hours, after which it should be removed.  Wrap patch in a tissue and discard in the trash. Wash hands thoroughly with soap and water . 2. You may remove the patch earlier than 72 hours if you experience unpleasant side effects which may include dry mouth, dizziness or visual disturbances. 3. Avoid touching the patch. Wash your hands with soap and water  after contact with the patch.

## 2024-03-18 NOTE — H&P (Signed)
 Chief Complaint: No chief complaint on file.  History of Present Illness: Cynthia Torres is a 79 y.o. female who is seen today as an office consultation for evaluation of No chief complaint on file.  The patient presents for evaluation of atypical ductal hyperplasia from core biopsy after mammogram revealed areas of microcalcifications in the right breast. There were 2 areas in the upper outer quadrant biopsy. 1 showed fibroadenomatoid change but the other had atypical ductal hyperplasia. No history of breast cancer. She gets mammograms regularly. No history of breast pain, breast mass or nipple discharge.  Review of Systems: A complete review of systems was obtained from the patient. I have reviewed this information and discussed as appropriate with the patient. See HPI as well for other ROS.  Review of Systems  All other systems reviewed and are negative.   Medical History: Past Medical History:  Diagnosis Date  Arthritis  Diabetes mellitus without complication (CMS/HHS-HCC)  Hyperlipidemia  Hypertension   There is no problem list on file for this patient.  Past Surgical History:  Procedure Laterality Date  breast biopsy right 12/25/2023  CESAREAN SECTION  HYSTERECTOMY  JOINT REPLACEMENT    Not on File  No current outpatient medications on file prior to visit.   No current facility-administered medications on file prior to visit.   Family History  Problem Relation Age of Onset  Clotting disorder Sister    Social History   Tobacco Use  Smoking Status Never  Smokeless Tobacco Never    Social History   Socioeconomic History  Marital status: Widowed  Tobacco Use  Smoking status: Never  Smokeless tobacco: Never   Social Drivers of Health   Food Insecurity: Low Risk (12/19/2023)  Received from [facility]  Hunger Vital Sign  Within the past 12 months, you worried that your food would run out before you got money to buy more: Never true  Within the past  12 months, the food you bought just didn't last and you didn't have money to get more. : Never true  Transportation Needs: No Transportation Needs (12/19/2023)  Received from [employer]  In the past 12 months, has lack of reliable transportation kept you from medical appointments, meetings, work or from getting things needed for daily living? : No  Social Connections: Moderately Integrated (04/10/2023)  Received from [facility]  Social Connection and Isolation Panel  In a typical week, how many times do you talk on the phone with family, friends, or neighbors?: More than three times a week  How often do you get together with friends or relatives?: Three times a week  How often do you attend church or religious services?: More than 4 times per year  Do you belong to any clubs or organizations such as church groups, unions, fraternal or athletic groups, or school groups?: Yes  How often do you attend meetings of the clubs or organizations you belong to?: More than 4 times per year  Are you married, widowed, divorced, separated, never married, or living with a partner?: Widowed  Housing Stability: Low Risk (12/19/2023)  Received from [employer] Stability Vital Sign  What is your living situation today?: I have a steady place to live  Think about the place you live. Do you have problems with any of the following? Choose all that apply:: None/None on this list   Objective:  There were no vitals filed for this visit.  There is no height or weight on file to  calculate BMI.  Physical Exam Exam conducted with a chaperone present.  HENT:  Head: Normocephalic.  Cardiovascular:  Rate and Rhythm: Normal rate.  Pulmonary:  Effort: Pulmonary effort is normal.  Chest:  Breasts: Right: Normal. No mass or nipple discharge.  Left: Normal. No mass or nipple discharge.  Lymphadenopathy:  Upper Body:  Right upper body: No axillary adenopathy.  Left upper body: No  axillary adenopathy.  Neurological:  General: No focal deficit present.  Mental Status: She is alert.  Psychiatric:  Mood and Affect: Mood normal.     Labs, Imaging and Diagnostic Testing:  MRN: [id number] Physician: Mir, Aliene DOB/Age [birth year] (Age: 18) Gender: F Collected Date: 12/25/2023 Received Date: 12/25/2023  FINAL DIAGNOSIS  1. Breast, right, needle core biopsy, calcifications, upper outer quadrant (coil clip) : BENIGN HYALINIZED FIBROMATOID NODULE WITH STROMAL CALCIFICATIONS FIBROCYSTIC CHANGES INCLUDING STROMAL FIBROSIS, CYSTIC DILATATION OF DUCTS, APOCRINE METAPLASIA AND USUAL DUCT HYPERPLASIA MICROCALCIFICATIONS ALSO PRESENT WITHIN BENIGN DUCTS NEGATIVE FOR ATYPIA AND CARCINOMA  2. Breast, right, needle core biopsy, calcifications, outer right breast ( x clip) : FOCAL ATYPICAL DUCTAL HYPERPLASIA FIBROCYSTIC CHANGES INCLUDING STROMAL FIBROSIS, CYSTIC DILATATION OF DUCTS, ADENOSIS, USUAL DUCT HYPERPLASIA AND COLUMNAR CELL CHANGE MICROCALCIFICATIONS PRESENT WITHIN BENIGN DUCTS  Diagnosis Note : -2.Diagnoses called to [facility] at [facility] by Dr.Picklesimer on 12/26/2023 at 9:08 AM.  ELECTRONIC SIGNATURE : Picklesimer Md, Fred , Sports Administrator, International Aid/development Worker  MICROSCOPIC DESCRIPTION  CASE COMMENTS STAINS USED IN DIAGNOSIS: H&E-2 H&E-3 H&E-4 H&E H&E-2 H&E-3 H&E-4 H&E H&E-2 H&E-3 H&E-4 H&E H&E-2 H&E-3 H&E-4 H&E H&E-2 H&E-3 H&E-4 H&E  CLINICAL HISTORY  SPECIMEN(S) OBTAINED 1. Breast, right, needle core biopsy, Calcifications, Upper Outer Quadrant (coil Clip) 2. Breast, right, needle core biopsy, Calcifications, Outer Right Breast ( X Clip)  SPECIMEN COMMENTS: 1. TIF: 0900, CIT: <5 min 2. TIF: 0935, CIT: <5 min SPECIMEN CLINICAL INFORMATION: 62. 79 year old woman with two 3mm groups of indeterminate right breast calcifications in the outer and upper outer right breast presents for stereotactic guided  biopsy  Gross Description 1. Received in formalin, labeled right breast calcs in a carousel container are multiple yellow-white fibroadipose tissue fragments aggregating to 1.1 x 0.7 x 0.3 cm.The specimen is entirely submitted.The portions indicated to have calcifications are submitted in cassette 1A, the remaining fragments are submitted in cassette 1B. Time in formalin: 0900 a.m. on 12-25-23 Cold ischemia time: Less than 5 minutes VG 12/25/2023 2. Received in formalin, labeled right breast calcs outer in a carousel container are multiple yellow-white fibroadipose tissue fragments aggregating to 2.0 x 1.8 x 0.7 cm.The specimen is entirely submitted.The portions indicated to have calcifications are submitted in cassette 2A, the remaining fragments are submitted in cassette 2B-2C. Time in formalin: 0935 a.m. on 12-25-23 Cold ischemia time: Less than 5 minutes VG 12/25/2023   CLINICAL DATA: 79 year old female recalled for right breast calcifications.  EXAM: DIGITAL DIAGNOSTIC UNILATERAL RIGHT MAMMOGRAM  TECHNIQUE: Right digital diagnostic mammography was performed. Additional 2D spot magnification views were obtained.  COMPARISON: Previous exam(s).  ACR Breast Density Category b: There are scattered areas of fibroglandular density.  FINDINGS: Right breast: Spot magnification views demonstrate pleomorphic calcifications in a somewhat linear distribution spanning an approximate 3 mm about the upper-outer breast, middle third. Slightly more lateral and posterior is an additional group of pleomorphic calcifications spanning approximately 3 mm.  IMPRESSION: Suspicious right breast calcifications as above.  RECOMMENDATION: Right breast stereotactic biopsy, 2 site.  I have discussed the findings and recommendations  with the patient. If applicable, a reminder letter will be sent to the patient regarding the next appointment.  BI-RADS CATEGORY 4:  Suspicious.   Electronically Signed By: Curtistine Noble On: 12/23/2023 16:24   Assessment and Plan:   Diagnoses and all orders for this visit:  Atypical ductal hyperplasia of right breast   Recommend right breast lumpectomy to exclude malignancy. Potential upgrade risk 20% or so. No other family history of breast cancer noted.The procedure has been discussed with the patient. Alternatives to surgery have been discussed with the patient. Risks of surgery include bleeding, Infection, Seroma formation, death, and the need for further surgery. The patient understands and wishes to proceed. May need high risk evaluation after surgery as well. History of DVT therefore not a good chemoprevention candidate unless aromatase inhibitors offered. Will await till surgery is done and results reviewed before evaluating.   DEBBY CURTISTINE SHIPPER, MD

## 2024-03-18 NOTE — Op Note (Signed)
 Preoperative diagnosis: Right breast atypical ductal hyperplasia  Postoperative diagnosis: Same  Procedure: Seed localized lumpectomy right breast  Surgeon: Debby Shipper, MD  Assistant: Tonja Shaper PA-C  Anesthesia: LMA with 0.25% Marcaine  with epinephrine   EBL: Minimal  Specimen: Right breast tissue with localizing seed, second specimen which is the anteromedial border with the clips, additional superior margin taken from lumpectomy cavity.  Drains: None  Indications for procedure: The patient is a 79 year old female with an abnormality detected in the right breast from calcifications.  Core biopsy showed atypical ductal hyperplasia.  Excision recommended due to potential upgrade risk of this lesion on core biopsy.  This was explained to the patient discussed in the office.  Risks and benefits as well as nonsurgical options of management were reviewed.The procedure has been discussed with the patient. Alternatives to surgery have been discussed with the patient.  Risks of surgery include bleeding,  Infection,  Seroma formation, death,  and the need for further surgery.   The patient understands and wishes to proceed.    Description of procedure: The patient was met in the holding area questions were answered.  Of note a seed was placed radiology prior to surgery.  The right breast was marked as the correct site.  Films were available for review.  The patient was taken to the operating room placed supine upon the operating table.  After induction of general anesthesia, the right breast was prepped and draped in a sterile fashion and timeout performed.  Neoprobe was used to identify the seed in the subareolar position right breast slightly to the right upper outer quadrant.  A curvilinear incision was made along the lateral border of the nipple areolar complex.  Dissection was carried down on all tissue around the seed and clip were excised with grossly negative margins.  Upon reviewing the  films, the seed was in the more superficial part of this but not the clips.  I opted to go more medial, and anterior to excised tissue below it.  This showed both clips to be in the specimen in.  All tissue was oriented with ink.  I took an additional superior margin as well due to the gross appearance.  All this tissue was oriented with ink and sent to pathology.  The cavity is found to be hemostatic.  Local anesthetic infiltrated.  Clips were placed to mark the cavity.  The deep tissue planes were approximated with 3-0 Vicryl.  4-0 Monocryl was used to close the skin in a subcuticular fashion.  Dermabond applied.  Breast binder placed.  All counts found to be correct.  The patient was awoke extubated taken to recovery in satisfactory condition.

## 2024-03-18 NOTE — Anesthesia Preprocedure Evaluation (Addendum)
"                                    Anesthesia Evaluation  Patient identified by MRN, date of birth, ID band Patient awake    Reviewed: Allergy & Precautions, H&P , NPO status , Patient's Chart, lab work & pertinent test results  Airway Mallampati: II  TM Distance: >3 FB Neck ROM: Full    Dental no notable dental hx. (+) Partial Lower, Partial Upper, Dental Advisory Given   Pulmonary neg pulmonary ROS   Pulmonary exam normal breath sounds clear to auscultation       Cardiovascular hypertension, Pt. on medications and Pt. on home beta blockers  Rhythm:Regular Rate:Normal     Neuro/Psych negative neurological ROS  negative psych ROS   GI/Hepatic Neg liver ROS,GERD  ,,  Endo/Other  diabetes, Type 2, Oral Hypoglycemic Agents    Renal/GU negative Renal ROS  negative genitourinary   Musculoskeletal  (+) Arthritis , Osteoarthritis,    Abdominal   Peds  Hematology  (+) Blood dyscrasia, anemia   Anesthesia Other Findings   Reproductive/Obstetrics negative OB ROS                              Anesthesia Physical Anesthesia Plan  ASA: 2  Anesthesia Plan: General   Post-op Pain Management: Tylenol  PO (pre-op)*   Induction: Intravenous  PONV Risk Score and Plan: 4 or greater and Ondansetron , Dexamethasone  and Treatment may vary due to age or medical condition  Airway Management Planned: LMA  Additional Equipment:   Intra-op Plan:   Post-operative Plan: Extubation in OR  Informed Consent: I have reviewed the patients History and Physical, chart, labs and discussed the procedure including the risks, benefits and alternatives for the proposed anesthesia with the patient or authorized representative who has indicated his/her understanding and acceptance.     Dental advisory given  Plan Discussed with: CRNA  Anesthesia Plan Comments:          Anesthesia Quick Evaluation  "

## 2024-03-18 NOTE — Transfer of Care (Signed)
 Immediate Anesthesia Transfer of Care Note  Patient: Cynthia Torres  Procedure(s) Performed: BREAST LUMPECTOMY WITH RADIOACTIVE SEED LOCALIZATION (Right: Breast)  Patient Location: PACU  Anesthesia Type:General  Level of Consciousness: drowsy, patient cooperative, and responds to stimulation  Airway & Oxygen Therapy: Patient Spontanous Breathing and Patient connected to face mask oxygen  Post-op Assessment: Report given to RN and Post -op Vital signs reviewed and stable  Post vital signs: Reviewed and stable  Last Vitals:  Vitals Value Taken Time  BP 154/85 03/18/24 14:20  Temp    Pulse    Resp 11 03/18/24 14:21  SpO2    Vitals shown include unfiled device data.  Last Pain:  Vitals:   03/18/24 1108  TempSrc: Temporal  PainSc: 0-No pain      Patients Stated Pain Goal: 3 (03/18/24 1108)  Complications: No notable events documented.

## 2024-03-19 ENCOUNTER — Encounter (HOSPITAL_BASED_OUTPATIENT_CLINIC_OR_DEPARTMENT_OTHER): Payer: Self-pay | Admitting: Surgery

## 2024-03-24 ENCOUNTER — Ambulatory Visit: Payer: Self-pay | Admitting: Surgery

## 2024-03-25 LAB — SURGICAL PATHOLOGY

## 2024-04-06 ENCOUNTER — Ambulatory Visit: Admitting: Podiatry

## 2024-06-01 ENCOUNTER — Ambulatory Visit: Admitting: Podiatry

## 2024-12-03 ENCOUNTER — Inpatient Hospital Stay: Attending: Hematology and Oncology

## 2024-12-03 ENCOUNTER — Inpatient Hospital Stay: Admitting: Hematology and Oncology
